# Patient Record
Sex: Male | Born: 1975 | Race: White | Hispanic: No | Marital: Single | Smoking: Current every day smoker
Health system: Southern US, Community
[De-identification: ages and names within clinical notes are randomized; demographics above are authoritative.]

## PROBLEM LIST (undated history)

## (undated) DIAGNOSIS — R569 Unspecified convulsions: Secondary | ICD-10-CM

## (undated) DIAGNOSIS — S61412A Laceration without foreign body of left hand, initial encounter: Secondary | ICD-10-CM

## (undated) HISTORY — PX: FINGER SURGERY: SHX640

## (undated) HISTORY — PX: HERNIA REPAIR: SHX51

---

## 2011-11-20 ENCOUNTER — Emergency Department (HOSPITAL_COMMUNITY)
Admission: EM | Admit: 2011-11-20 | Discharge: 2011-11-20 | Disposition: A | Discharge status: Home / Self Care | Payer: No Typology Code available for payment source | Attending: Emergency Medicine | Admitting: Emergency Medicine

## 2011-11-20 ENCOUNTER — Encounter (HOSPITAL_COMMUNITY): Payer: Self-pay | Admitting: Emergency Medicine

## 2011-11-20 ENCOUNTER — Emergency Department (HOSPITAL_COMMUNITY): Payer: No Typology Code available for payment source

## 2011-11-20 ENCOUNTER — Emergency Department (HOSPITAL_COMMUNITY): Payer: Self-pay

## 2011-11-20 DIAGNOSIS — F1010 Alcohol abuse, uncomplicated: Secondary | ICD-10-CM | POA: Insufficient documentation

## 2011-11-20 DIAGNOSIS — S161XXA Strain of muscle, fascia and tendon at neck level, initial encounter: Secondary | ICD-10-CM

## 2011-11-20 DIAGNOSIS — F10929 Alcohol use, unspecified with intoxication, unspecified: Secondary | ICD-10-CM

## 2011-11-20 DIAGNOSIS — R079 Chest pain, unspecified: Secondary | ICD-10-CM | POA: Insufficient documentation

## 2011-11-20 DIAGNOSIS — S139XXA Sprain of joints and ligaments of unspecified parts of neck, initial encounter: Secondary | ICD-10-CM | POA: Insufficient documentation

## 2011-11-20 NOTE — ED Notes (Signed)
Patient took off own ccollar at this time.
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
## 2011-11-20 NOTE — ED Notes (Signed)
PT co/ neck and back pain. On c collar and backboard.
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
## 2012-12-01 ENCOUNTER — Encounter (HOSPITAL_COMMUNITY): Payer: Self-pay | Admitting: *Deleted

## 2012-12-01 ENCOUNTER — Emergency Department (HOSPITAL_COMMUNITY)
Admission: EM | Admit: 2012-12-01 | Discharge: 2012-12-02 | Disposition: A | Discharge status: Home / Self Care | Payer: Self-pay | Attending: Emergency Medicine | Admitting: Emergency Medicine

## 2012-12-01 ENCOUNTER — Emergency Department (HOSPITAL_COMMUNITY): Payer: Self-pay

## 2012-12-01 DIAGNOSIS — S61209A Unspecified open wound of unspecified finger without damage to nail, initial encounter: Secondary | ICD-10-CM | POA: Insufficient documentation

## 2012-12-01 DIAGNOSIS — S61412A Laceration without foreign body of left hand, initial encounter: Secondary | ICD-10-CM

## 2012-12-01 DIAGNOSIS — W268XXA Contact with other sharp object(s), not elsewhere classified, initial encounter: Secondary | ICD-10-CM | POA: Insufficient documentation

## 2012-12-01 DIAGNOSIS — S61409A Unspecified open wound of unspecified hand, initial encounter: Secondary | ICD-10-CM | POA: Insufficient documentation

## 2012-12-01 DIAGNOSIS — Y9289 Other specified places as the place of occurrence of the external cause: Secondary | ICD-10-CM | POA: Insufficient documentation

## 2012-12-01 DIAGNOSIS — Y9389 Activity, other specified: Secondary | ICD-10-CM | POA: Insufficient documentation

## 2012-12-01 DIAGNOSIS — Y990 Civilian activity done for income or pay: Secondary | ICD-10-CM | POA: Insufficient documentation

## 2012-12-01 DIAGNOSIS — F17200 Nicotine dependence, unspecified, uncomplicated: Secondary | ICD-10-CM | POA: Insufficient documentation

## 2012-12-01 MED ORDER — OXYCODONE-ACETAMINOPHEN 5-325 MG PO TABS
1.0000 | ORAL_TABLET | Freq: Once | ORAL | Status: AC
  Administered 2012-12-01: 1 via ORAL
  Filled 2012-12-01: qty 1

## 2012-12-01 MED ORDER — BUPIVACAINE HCL (PF) 0.5 % IJ SOLN
10.0000 mL | Freq: Once | INTRAMUSCULAR | Status: AC
  Administered 2012-12-02: 10 mL
  Filled 2012-12-01: qty 10
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
## 2012-12-01 NOTE — ED Notes (Addendum)
Pt c/o dressing too tight. Cap refill <2sec. Finger pulses present. Dressing loosened. C/o numbness and tingling worse. Difference in skin temp distal and proximal to wound. FA cool to touch (different from R), no tightness noted in FA/ wrist. Decreased ROM (less than initial triage)

## 2012-12-02 MED ORDER — OXYCODONE-ACETAMINOPHEN 5-325 MG PO TABS: 2.0000 | ORAL_TABLET | ORAL | Status: DC | PRN

## 2012-12-02 NOTE — ED Provider Notes (Signed)
Medical screening examination/treatment/procedure(s) were conducted as a shared visit with non-physician practitioner(s) and myself.  I personally evaluated the patient during the encounter  Laceration overlying the volar, ulnar aspect of the left wrist. Significant pain on attempts to move the third, fourth and fifth fingers of that hand.  Hanley Seamen, MD 12/02/12 [phone]

## 2012-12-02 NOTE — ED Provider Notes (Signed)
History     CSN: [id number]  Arrival date & time 12/01/12  2101   First MD Initiated Contact with Patient 12/01/12 2303      Chief Complaint  Patient presents with  . Extremity Laceration  . Hand Pain   HPI  History provided by the patient. Patient is a 37 year old male who presents with laceration to his left hand. Patient states he was cleaning a large plate glass at work when it shattered causing a small cut to his palm of his left hand. Reports having electrical pain and tingling to his pinky and ring finger as well as up his forearm. Pain is worse with any movements of the hand or fingers. Patient does report removing one single large piece of glass from the area denies any persistent or heavy bleeding. Denies any other complaints. No other aggravating or alleviating factors. No other associated symptoms.    History reviewed. No pertinent past medical history.  Past Surgical History  Procedure Laterality Date  . Hernia repair    . Finger surgery      r/t staph infection I&D, hand lac cutting trees    No family history on file.  History  Substance Use Topics  . Smoking status: Current Every Day Smoker  . Smokeless tobacco: Not on file  . Alcohol Use: No      Review of Systems  Neurological: Positive for numbness. Negative for weakness.  All other systems reviewed and are negative.    Allergies  Review of patient's allergies indicates no known allergies.  Home Medications   Current Outpatient Rx  Name  Route  Sig  Dispense  Refill  . Multiple Vitamin (MULTIVITAMIN WITH MINERALS) TABS   Oral   Take 1 tablet by mouth daily.           BP 120/81  Pulse 66  Temp(Src) 98.7 F (37.1 C) (Oral)  Resp 20  SpO2 95%  Physical Exam  Nursing note and vitals reviewed. Constitutional: He is oriented to person, place, and time. He appears well-developed and well-nourished. No distress.  HENT:  Head: Normocephalic.  Cardiovascular: Normal rate and regular  rhythm.   Pulmonary/Chest: Effort normal and breath sounds normal.  Abdominal: Soft.  Musculoskeletal:  2.5 cm laceration to the minor eminence of the palm of the left hand. No significant active bleeding. No foreign bodies identified during exploration. No signs of tendon involvement during expiration.  Second small laceration over the dorsal PIP of fourth finger. No tendon involvement  Patient reports significant pain with full extension of his fingers especially the third through fifth fingers. He has decreased sensation primarily in the fourth digit with decreased 2 point sensation. There is normal cap refill less than 2 seconds.  Neurological: He is alert and oriented to person, place, and time.  Skin: Skin is warm.  Psychiatric: He has a normal mood and affect. His behavior is normal.    ED Course  Procedures   LACERATION REPAIR Performed by: Angus Seller Authorized by: Angus Seller Consent: Verbal consent obtained. Risks and benefits: risks, benefits and alternatives were discussed Consent given by: patient Patient identity confirmed: provided demographic data Prepped and Draped in normal sterile fashion Wound explored  Laceration Location: Palm of left hand  Laceration Length: 2.5 cm  No Foreign Bodies seen or palpated  Anesthesia: local infiltration  Local anesthetic: lidocaine 2% with epinephrine, bupivacaine 0.5% without epinephrine   Anesthetic total: 5 ml lidocaine, 6 mLs bupivacaine   Irrigation method: syringe Amount of  cleaning: standard  Skin closure: Skin with 4-0 Prolene   Number of sutures: 4   Technique: 3 simple interrupted 1 horizontal mattress   Patient tolerance: Patient tolerated the procedure well with no immediate complications.  LACERATION REPAIR Performed by: Angus Seller Authorized by: Angus Seller Consent: Verbal consent obtained. Risks and benefits: risks, benefits and alternatives were discussed Consent given by:  patient Patient identity confirmed: provided demographic data Prepped and Draped in normal sterile fashion Wound explored  Laceration Location: Dorsal PIP of left fourth finger  Laceration Length: 1 cm  No Foreign Bodies seen or palpated  Anesthesia: local infiltration  Local anesthetic: Bupivacaine 0.5% without epinephrine  Anesthetic total: 1 ml  Irrigation method: syringe Amount of cleaning: standard  Skin closure: Skin with 4-0 Prolene   Number of sutures: 2   Technique: Simple interrupted   Patient tolerance: Patient tolerated the procedure well with no immediate complications.    Dg Hand Complete Left  12/01/2012  *RADIOLOGY REPORT*  Clinical Data: Laceration to left and with broken glass.  LEFT HAND - COMPLETE 3+ VIEW  Comparison:  None.  Findings:  There is no evidence of fracture or dislocation.  There is no evidence of arthropathy or other focal bone abnormality. Soft tissues are unremarkable.No evidence of radiopaque foreign body or soft tissue gas.  IMPRESSION: Negative.   Original Report Authenticated By: Myles Rosenthal, M.D.      1. Laceration of hand, left, initial encounter       MDM  Patient seen and evaluated. Patient holding his hand and pacing rapidly appears in some pain and discomfort.  Patient was seen and evaluated with attending physician. Examination concerning for some nerve injury. At this time we'll plan to provide patient with orthopedic hand specialist referral for close followup.        Angus Seller, PA-C 12/02/12 [phone]

## 2012-12-06 ENCOUNTER — Encounter (HOSPITAL_BASED_OUTPATIENT_CLINIC_OR_DEPARTMENT_OTHER): Payer: Self-pay | Admitting: *Deleted

## 2012-12-07 ENCOUNTER — Other Ambulatory Visit: Payer: Self-pay | Admitting: Orthopedic Surgery

## 2012-12-11 ENCOUNTER — Encounter (HOSPITAL_BASED_OUTPATIENT_CLINIC_OR_DEPARTMENT_OTHER): Payer: Self-pay | Admitting: Certified Registered Nurse Anesthetist

## 2012-12-11 ENCOUNTER — Ambulatory Visit (HOSPITAL_BASED_OUTPATIENT_CLINIC_OR_DEPARTMENT_OTHER): Payer: Worker's Compensation | Admitting: Certified Registered Nurse Anesthetist

## 2012-12-11 ENCOUNTER — Ambulatory Visit (HOSPITAL_BASED_OUTPATIENT_CLINIC_OR_DEPARTMENT_OTHER)
Admission: RE | Admit: 2012-12-11 | Discharge: 2012-12-11 | Disposition: A | Discharge status: Home / Self Care | Payer: Worker's Compensation | Source: Ambulatory Visit | Attending: Orthopedic Surgery | Admitting: Orthopedic Surgery

## 2012-12-11 ENCOUNTER — Encounter (HOSPITAL_BASED_OUTPATIENT_CLINIC_OR_DEPARTMENT_OTHER): Payer: Self-pay

## 2012-12-11 ENCOUNTER — Encounter (HOSPITAL_BASED_OUTPATIENT_CLINIC_OR_DEPARTMENT_OTHER)
Admission: RE | Disposition: A | Discharge status: Home / Self Care | Payer: Self-pay | Source: Ambulatory Visit | Attending: Orthopedic Surgery

## 2012-12-11 DIAGNOSIS — S61209A Unspecified open wound of unspecified finger without damage to nail, initial encounter: Secondary | ICD-10-CM | POA: Insufficient documentation

## 2012-12-11 DIAGNOSIS — S5410XA Injury of median nerve at forearm level, unspecified arm, initial encounter: Secondary | ICD-10-CM | POA: Insufficient documentation

## 2012-12-11 DIAGNOSIS — F17200 Nicotine dependence, unspecified, uncomplicated: Secondary | ICD-10-CM | POA: Insufficient documentation

## 2012-12-11 DIAGNOSIS — W268XXA Contact with other sharp object(s), not elsewhere classified, initial encounter: Secondary | ICD-10-CM | POA: Insufficient documentation

## 2012-12-11 DIAGNOSIS — R209 Unspecified disturbances of skin sensation: Secondary | ICD-10-CM | POA: Insufficient documentation

## 2012-12-11 HISTORY — PX: NERVE REPAIR: SHX2083

## 2012-12-11 HISTORY — DX: Laceration without foreign body of left hand, initial encounter: S61.412A

## 2012-12-11 LAB — POCT HEMOGLOBIN-HEMACUE: Hemoglobin: 15.8 g/dL (ref 13.0–17.0)

## 2012-12-11 SURGERY — REPAIR, NERVE
Anesthesia: General | Site: Hand | Laterality: Left | Wound class: Contaminated

## 2012-12-11 MED ORDER — ACETAMINOPHEN 10 MG/ML IV SOLN
1000.0000 mg | Freq: Once | INTRAVENOUS | Status: AC
  Administered 2012-12-11: 1000 mg via INTRAVENOUS

## 2012-12-11 MED ORDER — CEFAZOLIN SODIUM-DEXTROSE 2-3 GM-% IV SOLR
2.0000 g | INTRAVENOUS | Status: AC
  Administered 2012-12-11: 2 g via INTRAVENOUS

## 2012-12-11 MED ORDER — LIDOCAINE HCL (CARDIAC) 20 MG/ML IV SOLN
INTRAVENOUS | Status: DC | PRN
  Administered 2012-12-11: 60 mg via INTRAVENOUS

## 2012-12-11 MED ORDER — FENTANYL CITRATE 0.05 MG/ML IJ SOLN
INTRAMUSCULAR | Status: DC | PRN
  Administered 2012-12-11 (×6): 25 ug via INTRAVENOUS
  Administered 2012-12-11: 100 ug via INTRAVENOUS
  Administered 2012-12-11: 25 ug via INTRAVENOUS

## 2012-12-11 MED ORDER — LACTATED RINGERS IV SOLN
INTRAVENOUS | Status: DC
  Administered 2012-12-11 (×2): via INTRAVENOUS

## 2012-12-11 MED ORDER — OXYCODONE HCL 5 MG/5ML PO SOLN: 5.0000 mg | Freq: Once | ORAL | Status: AC | PRN

## 2012-12-11 MED ORDER — HYDROMORPHONE HCL PF 1 MG/ML IJ SOLN
0.2500 mg | INTRAMUSCULAR | Status: DC | PRN
  Administered 2012-12-11 (×3): 0.5 mg via INTRAVENOUS

## 2012-12-11 MED ORDER — ONDANSETRON HCL 4 MG/2ML IJ SOLN
INTRAMUSCULAR | Status: DC | PRN
  Administered 2012-12-11: 4 mg via INTRAVENOUS

## 2012-12-11 MED ORDER — DEXAMETHASONE SODIUM PHOSPHATE 10 MG/ML IJ SOLN
INTRAMUSCULAR | Status: DC | PRN
  Administered 2012-12-11: 10 mg via INTRAVENOUS

## 2012-12-11 MED ORDER — CHLORHEXIDINE GLUCONATE 4 % EX LIQD: 60.0000 mL | Freq: Once | CUTANEOUS | Status: DC

## 2012-12-11 MED ORDER — MEPERIDINE HCL 25 MG/ML IJ SOLN: 6.2500 mg | INTRAMUSCULAR | Status: DC | PRN

## 2012-12-11 MED ORDER — MIDAZOLAM HCL 2 MG/2ML IJ SOLN: 1.0000 mg | INTRAMUSCULAR | Status: DC | PRN

## 2012-12-11 MED ORDER — 0.9 % SODIUM CHLORIDE (POUR BTL) OPTIME
TOPICAL | Status: DC | PRN
  Administered 2012-12-11: 200 mL

## 2012-12-11 MED ORDER — OXYCODONE-ACETAMINOPHEN 5-325 MG PO TABS: ORAL_TABLET | ORAL | Status: DC

## 2012-12-11 MED ORDER — FENTANYL CITRATE 0.05 MG/ML IJ SOLN: 50.0000 ug | INTRAMUSCULAR | Status: DC | PRN

## 2012-12-11 MED ORDER — PROPOFOL 10 MG/ML IV BOLUS
INTRAVENOUS | Status: DC | PRN
  Administered 2012-12-11: 250 mg via INTRAVENOUS

## 2012-12-11 MED ORDER — MIDAZOLAM HCL 5 MG/5ML IJ SOLN
INTRAMUSCULAR | Status: DC | PRN
  Administered 2012-12-11: 2 mg via INTRAVENOUS

## 2012-12-11 MED ORDER — PROMETHAZINE HCL 25 MG/ML IJ SOLN: 6.2500 mg | INTRAMUSCULAR | Status: DC | PRN

## 2012-12-11 MED ORDER — OXYCODONE HCL 5 MG PO TABS
5.0000 mg | ORAL_TABLET | Freq: Once | ORAL | Status: AC | PRN
  Administered 2012-12-11: 5 mg via ORAL

## 2012-12-11 MED ORDER — MIDAZOLAM HCL 2 MG/ML PO SYRP: 12.0000 mg | ORAL_SOLUTION | Freq: Once | ORAL | Status: DC | PRN

## 2012-12-11 MED ORDER — MIDAZOLAM HCL 2 MG/2ML IJ SOLN: 0.5000 mg | Freq: Once | INTRAMUSCULAR | Status: DC | PRN

## 2012-12-11 MED ORDER — BUPIVACAINE HCL (PF) 0.25 % IJ SOLN
INTRAMUSCULAR | Status: DC | PRN
  Administered 2012-12-11: 10 mL

## 2012-12-11 SURGICAL SUPPLY — 64 items
BAG DECANTER FOR FLEXI CONT (MISCELLANEOUS) IMPLANT
BANDAGE ELASTIC 3 VELCRO ST LF (GAUZE/BANDAGES/DRESSINGS) ×2 IMPLANT
BANDAGE GAUZE ELAST BULKY 4 IN (GAUZE/BANDAGES/DRESSINGS) ×2 IMPLANT
BLADE MINI RND TIP GREEN BEAV (BLADE) IMPLANT
BLADE SURG 15 STRL LF DISP TIS (BLADE) ×2 IMPLANT
BLADE SURG 15 STRL SS (BLADE) ×2
BNDG ESMARK 4X9 LF (GAUZE/BANDAGES/DRESSINGS) ×2 IMPLANT
BRUSH SCRUB EZ PLAIN DRY (MISCELLANEOUS) ×2 IMPLANT
CHLORAPREP W/TINT 26ML (MISCELLANEOUS) ×2 IMPLANT
CLOTH BEACON ORANGE TIMEOUT ST (SAFETY) ×2 IMPLANT
CORDS BIPOLAR (ELECTRODE) ×2 IMPLANT
COVER MAYO STAND STRL (DRAPES) ×2 IMPLANT
COVER TABLE BACK 60X90 (DRAPES) ×2 IMPLANT
CUFF TOURNIQUET SINGLE 18IN (TOURNIQUET CUFF) IMPLANT
CUFF TOURNIQUET SINGLE 24IN (TOURNIQUET CUFF) ×2 IMPLANT
DECANTER SPIKE VIAL GLASS SM (MISCELLANEOUS) ×2 IMPLANT
DRAPE EXTREMITY T 121X128X90 (DRAPE) ×2 IMPLANT
DRAPE SURG 17X23 STRL (DRAPES) ×2 IMPLANT
GAUZE XEROFORM 1X8 LF (GAUZE/BANDAGES/DRESSINGS) ×2 IMPLANT
GLOVE BIO SURGEON STRL SZ 6.5 (GLOVE) ×4 IMPLANT
GLOVE BIO SURGEON STRL SZ7.5 (GLOVE) ×2 IMPLANT
GLOVE BIOGEL PI IND STRL 7.0 (GLOVE) ×2 IMPLANT
GLOVE BIOGEL PI IND STRL 8 (GLOVE) ×2 IMPLANT
GLOVE BIOGEL PI IND STRL 8.5 (GLOVE) ×3 IMPLANT
GLOVE BIOGEL PI INDICATOR 7.0 (GLOVE) ×2
GLOVE BIOGEL PI INDICATOR 8 (GLOVE) ×2
GLOVE BIOGEL PI INDICATOR 8.5 (GLOVE) ×3
GOWN BRE IMP PREV XXLGXLNG (GOWN DISPOSABLE) ×4 IMPLANT
GOWN PREVENTION PLUS XLARGE (GOWN DISPOSABLE) ×4 IMPLANT
LOOP VESSEL MAXI BLUE (MISCELLANEOUS) IMPLANT
NDL SAFETY ECLIPSE 18X1.5 (NEEDLE) IMPLANT
NEEDLE HYPO 18GX1.5 SHARP (NEEDLE)
NEEDLE HYPO 25X1 1.5 SAFETY (NEEDLE) ×2 IMPLANT
NERVE GUIDE NEURAGEN 6MM (Tissue) ×2 IMPLANT
NS IRRIG 1000ML POUR BTL (IV SOLUTION) ×2 IMPLANT
PACK BASIN DAY SURGERY FS (CUSTOM PROCEDURE TRAY) ×2 IMPLANT
PAD CAST 3X4 CTTN HI CHSV (CAST SUPPLIES) ×1 IMPLANT
PAD CAST 4YDX4 CTTN HI CHSV (CAST SUPPLIES) IMPLANT
PADDING CAST ABS 4INX4YD NS (CAST SUPPLIES)
PADDING CAST ABS COTTON 4X4 ST (CAST SUPPLIES) IMPLANT
PADDING CAST COTTON 3X4 STRL (CAST SUPPLIES) ×1
PADDING CAST COTTON 4X4 STRL (CAST SUPPLIES)
SLEEVE SCD COMPRESS KNEE MED (MISCELLANEOUS) ×2 IMPLANT
SPEAR EYE SURG WECK-CEL (MISCELLANEOUS) IMPLANT
SPLINT PLASTER CAST XFAST 3X15 (CAST SUPPLIES) ×12 IMPLANT
SPLINT PLASTER XTRA FASTSET 3X (CAST SUPPLIES) ×12
SPONGE GAUZE 4X4 12PLY (GAUZE/BANDAGES/DRESSINGS) ×2 IMPLANT
STOCKINETTE 4X48 STRL (DRAPES) ×2 IMPLANT
SUT ETHIBOND 3-0 V-5 (SUTURE) IMPLANT
SUT ETHILON 4 0 PS 2 18 (SUTURE) ×4 IMPLANT
SUT ETHILON 8 0 BV130 4 (SUTURE) ×2 IMPLANT
SUT FIBERWIRE 4-0 18 TAPR NDL (SUTURE)
SUT MERSILENE 4 0 P 3 (SUTURE) ×2 IMPLANT
SUT MERSILENE 6 0 P 1 (SUTURE) IMPLANT
SUT NYLON 9 0 VRM6 (SUTURE) ×4 IMPLANT
SUT PROLENE 6 0 P 1 18 (SUTURE) IMPLANT
SUT SILK 4 0 PS 2 (SUTURE) IMPLANT
SUT VICRYL 4-0 PS2 18IN ABS (SUTURE) IMPLANT
SUTURE FIBERWR 4-0 18 TAPR NDL (SUTURE) IMPLANT
SYR BULB 3OZ (MISCELLANEOUS) ×2 IMPLANT
SYR CONTROL 10ML LL (SYRINGE) ×2 IMPLANT
TOWEL OR 17X24 6PK STRL BLUE (TOWEL DISPOSABLE) ×4 IMPLANT
TRAY DSU PREP LF (CUSTOM PROCEDURE TRAY) ×2 IMPLANT
UNDERPAD 30X30 INCONTINENT (UNDERPADS AND DIAPERS) ×2 IMPLANT

## 2012-12-11 NOTE — Anesthesia Procedure Notes (Signed)
Procedure Name: LMA Insertion Date/Time: 12/11/2012 1:57 PM Performed by: Kutter Schnepf D Pre-anesthesia Checklist: Patient identified, Emergency Drugs available, Suction available and Patient being monitored Patient Re-evaluated:Patient Re-evaluated prior to inductionOxygen Delivery Method: Circle System Utilized Preoxygenation: Pre-oxygenation with 100% oxygen Intubation Type: IV induction Ventilation: Mask ventilation without difficulty LMA: LMA inserted LMA Size: 5.0 Number of attempts: 1 Airway Equipment and Method: bite block Placement Confirmation: positive ETCO2 Tube secured with: Tape Dental Injury: Teeth and Oropharynx as per pre-operative assessment

## 2012-12-11 NOTE — Anesthesia Postprocedure Evaluation (Signed)
  Anesthesia Post-op Note  Patient: Lucas Williams  Procedure(s) Performed: Procedure(s): Left Hand Exploration Wound with Nerve Repair (Left)  Patient Location: PACU  Anesthesia Type:General  Level of Consciousness: awake, alert , oriented and patient cooperative  Airway and Oxygen Therapy: Patient Spontanous Breathing  Post-op Pain: mild  Post-op Assessment: Post-op Vital signs reviewed, Patient's Cardiovascular Status Stable, Respiratory Function Stable, Patent Airway, No signs of Nausea or vomiting and Pain level controlled  Post-op Vital Signs: Reviewed and stable  Complications: No apparent anesthesia complications

## 2012-12-11 NOTE — Transfer of Care (Signed)
Immediate Anesthesia Transfer of Care Note  Patient: Lucas Williams  Procedure(s) Performed: Procedure(s): Left Hand Exploration Wound with Nerve Repair (Left)  Patient Location: PACU  Anesthesia Type:General  Level of Consciousness: awake, alert , oriented and patient cooperative  Airway & Oxygen Therapy: Patient Spontanous Breathing and Patient connected to face mask oxygen  Post-op Assessment: Report given to PACU RN and Post -op Vital signs reviewed and stable  Post vital signs: Reviewed and stable  Complications: No apparent anesthesia complications

## 2012-12-11 NOTE — Brief Op Note (Signed)
12/11/2012  4:15 PM  PATIENT:  Lucas Williams  37 y.o. male  PRE-OPERATIVE DIAGNOSIS:  Left Hand Laceration  POST-OPERATIVE DIAGNOSIS:  Left Hand Laceration  PROCEDURE:  Procedure(s): Left Hand Exploration Wound with Nerve Repair (Left)  SURGEON:  Surgeon(s) and Role:    * Tami Ribas, MD - Primary    * Nicki Reaper, MD - Assisting  PHYSICIAN ASSISTANT:   ASSISTANTS: Cindee Salt, MD   ANESTHESIA:   general  EBL:  Total I/O In: 2200 [I.V.:2200] Out: -   BLOOD ADMINISTERED:none  DRAINS: none   LOCAL MEDICATIONS USED:  MARCAINE     SPECIMEN:  No Specimen  DISPOSITION OF SPECIMEN:  N/A  COUNTS:  YES  TOURNIQUET:   Total Tourniquet Time Documented: Upper Arm (Left) - 110 minutes Total: Upper Arm (Left) - 110 minutes   DICTATION: .Other Dictation: Dictation Number [id number]  PLAN OF CARE: Discharge to home after PACU  PATIENT DISPOSITION:  PACU - hemodynamically stable.

## 2012-12-11 NOTE — H&P (Signed)
  Lucas Williams is an 37 y.o. male.   Chief Complaint: left hand laceration and numbness HPI: 37 yo rhd male states he was at work 12/02/12 when tip jar broke and lacerated his left hand.  Seen at [facility] and wound I&D'd and sutures placed.  Reports numbness in index, long, and ring.  No previous injury and no other injury at this time.  Past Medical History  Diagnosis Date  . Laceration of hand, left     Past Surgical History  Procedure Laterality Date  . Finger surgery      r/t staph infection I&D, hand lac cutting trees  . Hernia repair      LT ihr    History reviewed. No pertinent family history. Social History:  reports that he has been smoking.  He has never used smokeless tobacco. He reports that he does not drink alcohol or use illicit drugs.  Allergies: No Known Allergies  Medications Prior to Admission  Medication Sig Dispense Refill  . Multiple Vitamin (MULTIVITAMIN WITH MINERALS) TABS Take 1 tablet by mouth daily.      Marland Kitchen oxyCODONE-acetaminophen (PERCOCET) 5-325 MG per tablet Take 2 tablets by mouth every 4 (four) hours as needed for pain.  20 tablet  0    Results for orders placed during the hospital encounter of 12/11/12 (from the past 48 hour(s))  POCT HEMOGLOBIN-HEMACUE     Status: None   Collection Time    12/11/12 12:05 PM      Result Value Range   Hemoglobin 15.8  13.0 - 17.0 g/dL    No results found.   A comprehensive review of systems was negative.  Blood pressure 134/72, pulse 71, temperature 98.2 F (36.8 C), temperature source Oral, resp. rate 18, height 6' (1.829 m), weight 103.987 kg (229 lb 4 oz), SpO2 97.00%.  General appearance: alert, cooperative and appears stated age Head: Normocephalic, without obvious abnormality, atraumatic Neck: supple, symmetrical, trachea midline Resp: clear to auscultation bilaterally Cardio: regular rate and rhythm GI: non tender Extremities: right UE: no wounds/ttp.  intact sensation and capillary refill all  digits.  +epl/fpl/io.  left hand: intact capillary refill all digits.  +epl/fpl.  wiggles digits.  poor sensation in long, index, and radial side of ring.  ulnar side of ring and thumb and small with better sensation.  wound at base of palm.   Pulses: 2+ and symmetric Skin: as above Neurologic: Grossly normal except as above Incision/Wound: As above  Assessment/Plan Left hand lacerations with possible nerve injury.  Recommend OR for exploration of wound and possible nerve repair as necessary.  Risks, benefits, and alternatives of surgery were discussed and the patient agrees with the plan of care.   Peggie Hornak R 12/11/2012, 12:32 PM

## 2012-12-11 NOTE — Op Note (Signed)
[id number] 

## 2012-12-11 NOTE — Anesthesia Preprocedure Evaluation (Signed)
Anesthesia Evaluation  Patient identified by MRN, date of birth, ID band Patient awake    Reviewed: Allergy & Precautions, H&P , NPO status , Patient's Chart, lab work & pertinent test results  History of Anesthesia Complications Negative for: history of anesthetic complications  Airway Mallampati: II TM Distance: >3 FB Neck ROM: Full    Dental  (+) Teeth Intact and Dental Advisory Given   Pulmonary Current Smoker,  breath sounds clear to auscultation  Pulmonary exam normal       Cardiovascular negative cardio ROS  Rhythm:Regular Rate:Normal     Neuro/Psych negative neurological ROS  negative psych ROS   GI/Hepatic negative GI ROS, Neg liver ROS,   Endo/Other  negative endocrine ROSMorbid obesity  Renal/GU negative Renal ROS     Musculoskeletal   Abdominal (+) + obese,   Peds  Hematology   Anesthesia Other Findings   Reproductive/Obstetrics                           Anesthesia Physical Anesthesia Plan  ASA: II  Anesthesia Plan: General   Post-op Pain Management:    Induction: Intravenous  Airway Management Planned: LMA  Additional Equipment:   Intra-op Plan:   Post-operative Plan:   Informed Consent: I have reviewed the patients History and Physical, chart, labs and discussed the procedure including the risks, benefits and alternatives for the proposed anesthesia with the patient or authorized representative who has indicated his/her understanding and acceptance.   Dental advisory given  Plan Discussed with: Surgeon and CRNA  Anesthesia Plan Comments: (Plan routine monitors, GA- LMA OK)        Anesthesia Quick Evaluation

## 2012-12-12 NOTE — Op Note (Signed)
NAMESTORY, VANVRANKEN NO.:  [id number]  MEDICAL RECORD NO.:  [id number]  LOCATION:                                 FACILITY:  PHYSICIAN:  Betha Loa, MD        DATE OF BIRTH:  16-Feb-1976  DATE OF PROCEDURE:  12/11/2012 DATE OF DISCHARGE:                              OPERATIVE REPORT   PREOPERATIVE DIAGNOSIS:  Left median nerve laceration and left ring finger extensor tendon laceration at proximal interphalangeal joint.  POSTOPERATIVE DIAGNOSIS:  Left median nerve laceration and left ring finger extensor tendon laceration at proximal interphalangeal joint.  PROCEDURE:   1. Left ring finger repair of extensor tendon laceration  2. Direct repair left partial median nerve laceration under microscope with  Neurogen tube wrap 3. Carpal tunnel release  SURGEON:  Betha Loa, MD  ASSISTANT:  Cindee Salt, MD  ANESTHESIA:  General.  IV FLUIDS:  Per anesthesia flow sheet.  ESTIMATED BLOOD LOSS:  Minimal.  COMPLICATIONS:  None.  SPECIMENS:  None.  TOURNIQUET TIME:  One hour 50 minutes.  DISPOSITION:  Stable to PACU.  INDICATIONS:  Lucas Williams is a 37 year old male who states that while at work, a tip jar broke lacerating his left hand.  He was seen in emergency department and his wounds were cleaned and sutured.  He followed up in the office.  He reported decreased sensation in the index long and ring fingers, particularly in the long finger.  He also noted laceration on the dorsum of the ring finger.  I discussed with Lucas Williams, the nature of the injury.  We recommended operative exploration of the wounds with potential repair of median nerve.  Risks, benefits, and alternatives of surgery were discussed including the risk of blood loss, infection, damage to nerves, vessels, tendons, ligaments, bone; failure of surgery; need for additional surgery, complications with wound healing, continued pain, continued numbness.  He voiced understanding of  these risks and elected to proceed.  OPERATIVE COURSE:  After being identified preoperatively by myself, the patient and I agreed upon procedure and site of procedure.  Surgical site was marked.  The risks, benefits, and alternatives of surgery were reviewed and he wished to proceed.  Surgical consent had been signed. He was given 2 g of IV Ancef as preoperative antibiotic prophylaxis.  He was transported to the operating room and placed on the operating room table in supine position with left upper extremity on arm board. General anesthesia was induced by anesthesiologist.  Left upper extremity was prepped and draped in normal sterile orthopedic fashion. Surgical pause was performed between surgeons, anesthesia, operating staff, and all were in agreement as to the patient, procedure, and site of procedure.  Tourniquet at the proximal aspect of the extremity was inflated to 250 mmHg after exsanguination of the limb with an Esmarch bandage.  The sutures had been removed.  The ring finger was explored first.  The wound was extended both proximally and distally.  There was a longitudinal laceration of the extensor tendon at the level of the PIP joint.  It did not appear to go into the joint.  There was no gross purulence.  The wound was copiously  irrigated with sterile saline.  A 4- 0 Mersilene suture was used in a figure-of-eight fashion to reapproximate the tendon edges.  The wound was then closed with 4-0 nylon in a horizontal mattress fashion.  Attention was turned to the volar wound.  It was extended again both proximally and distally.  It was carried into subcutaneous tissues by spreading technique.  Bipolar electrocautery was used to obtain hemostasis.  Release of the carpal tunnel was performed.  A transverse carpal ligament was incised sharply. Care was taken to ensure complete decompression distally.  The volar antebrachial fascia was then split as well.  The ulnar nerve and  artery were visualized and were intact.  They were traced through Guyon's canal which was also released.  The median nerve was identified.  There was a partial laceration at the level of the proximal aspect of the transverse carpal ligament.  The motor branch was identified and was intact.  The laceration was on the ulnar side of the nerve.  It coursed through the ulnar half of the nerve.  It was cleared of hematoma and freed up.  The tendons were inspected and were intact.  The microscope was brought in. The epineurium was released from the nerve to allow better visualization.  The hematoma was cleared off the end of the nerve.  A 9- 0 nylon suture was used in interrupted circumferential fashion to repair the nerve edges back to each other.  A 7-0 nylon suture was used also to take tension off the repair.  A 6 mm NeuroGen tube was split and used as a nerve wrap.  This was wrapped around the nerve at the injury site and sutured to itself.  It was done loosely, so there was no compression of the nerve.  The wound was copiously irrigated with sterile saline.  It was then closed with 4-0 nylon in a horizontal mattress fashion.  It was injected with 10 mL of 0.25% plain Marcaine to aid in postoperative analgesia.  It was then dressed with sterile Xeroform, 4x4s, and wrapped with a Kerlix bandage.  A dorsal splint including the index, long, ring, and small fingers were placed with the MPs flexed and the IPs extended. The wrist was flexed at 45 degrees.  This was wrapped with Kerlix and Ace bandage.  Tourniquet was deflated at 1 hour 10 minutes.  All fingertips were pink with brisk capillary refill after deflation of tourniquet.  Operative drapes were broken down and the patient was awoken from anesthesia safely.  He was transferred back to stretcher and taken to PACU in stable condition.  I will see him back in the office in 1 week for postoperative followup.  I will give him Percocet 5/325,  1-2 p.o. q.6 hours p.r.n. pain, dispensed #40.     Betha Loa, MD     KK/MEDQ  D:  12/11/2012  T:  12/12/2012  Job:  [id number]

## 2012-12-12 NOTE — Progress Notes (Signed)
Pt. encouraged to call Dr. Merlyn Lot if pain persists/is not tolerable.

## 2012-12-15 ENCOUNTER — Encounter (HOSPITAL_BASED_OUTPATIENT_CLINIC_OR_DEPARTMENT_OTHER): Payer: Self-pay | Admitting: Orthopedic Surgery

## 2015-10-20 ENCOUNTER — Emergency Department (HOSPITAL_COMMUNITY): Payer: Self-pay

## 2015-10-20 ENCOUNTER — Emergency Department (HOSPITAL_COMMUNITY)
Admission: EM | Admit: 2015-10-20 | Discharge: 2015-10-20 | Disposition: A | Discharge status: Home / Self Care | Payer: Self-pay | Attending: Emergency Medicine | Admitting: Emergency Medicine

## 2015-10-20 ENCOUNTER — Encounter (HOSPITAL_COMMUNITY): Payer: Self-pay | Admitting: *Deleted

## 2015-10-20 DIAGNOSIS — K219 Gastro-esophageal reflux disease without esophagitis: Secondary | ICD-10-CM | POA: Insufficient documentation

## 2015-10-20 DIAGNOSIS — Z79899 Other long term (current) drug therapy: Secondary | ICD-10-CM | POA: Insufficient documentation

## 2015-10-20 DIAGNOSIS — B349 Viral infection, unspecified: Secondary | ICD-10-CM | POA: Insufficient documentation

## 2015-10-20 DIAGNOSIS — F17200 Nicotine dependence, unspecified, uncomplicated: Secondary | ICD-10-CM | POA: Insufficient documentation

## 2015-10-20 LAB — CBC
HEMATOCRIT: 47.1 % (ref 39.0–52.0)
HEMOGLOBIN: 16.3 g/dL (ref 13.0–17.0)
MCH: 30.6 pg (ref 26.0–34.0)
MCHC: 34.6 g/dL (ref 30.0–36.0)
MCV: 88.5 fL (ref 78.0–100.0)
Platelets: 235 10*3/uL (ref 150–400)
RBC: 5.32 MIL/uL (ref 4.22–5.81)
RDW: 12.9 % (ref 11.5–15.5)
WBC: 8.8 10*3/uL (ref 4.0–10.5)

## 2015-10-20 LAB — BASIC METABOLIC PANEL
Anion gap: 10 (ref 5–15)
BUN: 10 mg/dL (ref 6–20)
CHLORIDE: 101 mmol/L (ref 101–111)
CO2: 28 mmol/L (ref 22–32)
Calcium: 9.8 mg/dL (ref 8.9–10.3)
Creatinine, Ser: 0.92 mg/dL (ref 0.61–1.24)
GFR calc non Af Amer: >60 mL/min (ref >60)
Glucose, Bld: 105 mg/dL — ABNORMAL HIGH (ref 65–99)
POTASSIUM: 4.7 mmol/L (ref 3.5–5.1)
SODIUM: 139 mmol/L (ref 135–145)

## 2015-10-20 LAB — TROPONIN I

## 2015-10-20 MED ORDER — OMEPRAZOLE 20 MG PO CPDR: 20.0000 mg | DELAYED_RELEASE_CAPSULE | Freq: Every day | ORAL | Status: DC

## 2015-10-20 MED ORDER — ALBUTEROL SULFATE (2.5 MG/3ML) 0.083% IN NEBU
2.5000 mg | INHALATION_SOLUTION | Freq: Once | RESPIRATORY_TRACT | Status: AC
  Administered 2015-10-20: 2.5 mg via RESPIRATORY_TRACT
  Filled 2015-10-20: qty 3

## 2015-10-20 MED ORDER — SODIUM CHLORIDE 0.9 % IV BOLUS (SEPSIS)
1000.0000 mL | Freq: Once | INTRAVENOUS | Status: AC
  Administered 2015-10-20: 1000 mL via INTRAVENOUS

## 2015-10-20 NOTE — ED Provider Notes (Signed)
CSN: [id number]     Arrival date & time 10/20/15  1300 History   First MD Initiated Contact with Patient 10/20/15 1642     Chief Complaint  Patient presents with  . Cough  . Chest Pain    assoc with eating   (Consider location/radiation/quality/duration/timing/severity/associated sxs/prior Treatment) HPI  40 y.o. male presents to the Emergency Department today complaining of cough/congestion/chest pain x 8 days with associated N/V. No hematemesis. Has had reported fever 102 on Monday. Dry cough. L ear pain. Shortness of breath. Notes decrease in PO intake due to N and eventual emesis. Chest pain occurs while eating. States that it is a sharp/crampy sensation 10/10 pain after ingestion. He is a smoker 1/2 PPD for 15-16 years. No other symptoms noted.   Past Medical History  Diagnosis Date  . Laceration of hand, left    Past Surgical History  Procedure Laterality Date  . Finger surgery      r/t staph infection I&D, hand lac cutting trees  . Hernia repair      LT ihr  . Nerve repair Left 12/11/2012    Procedure: Left Hand Exploration Wound with Nerve Repair;  Surgeon: Tami Ribas, MD;  Location: [facility];  Service: Orthopedics;  Laterality: Left;   No family history on file. Social History  Substance Use Topics  . Smoking status: Current Every Day Smoker -- 0.50 packs/day  . Smokeless tobacco: Never Used  . Alcohol Use: No    Review of Systems ROS reviewed and all are negative for acute change except as noted in the HPI.  Allergies  Review of patient's allergies indicates no known allergies.  Home Medications   Prior to Admission medications   Medication Sig Start Date End Date Taking? Authorizing Provider  Multiple Vitamin (MULTIVITAMIN WITH MINERALS) TABS Take 1 tablet by mouth daily.    Historical Provider, MD  oxyCODONE-acetaminophen (PERCOCET) 5-325 MG per tablet Take 2 tablets by mouth every 4 (four) hours as needed for pain. 12/02/12   Ivonne Andrew,  PA-C  oxyCODONE-acetaminophen (PERCOCET) 5-325 MG per tablet 1-2 tabs po q6 hours prn pain 12/11/12   Betha Loa, MD   BP 145/112 mmHg  Pulse 80  Temp(Src) 97.7 F (36.5 C) (Oral)  Resp 16  Ht 6' (1.829 m)  Wt 118.389 kg  BMI 35.39 kg/m2  SpO2 98%   Physical Exam  Constitutional: He is oriented to person, place, and time. He appears well-developed and well-nourished.  HENT:  Head: Normocephalic and atraumatic.  Right Ear: Tympanic membrane, external ear and ear canal normal.  Left Ear: Tympanic membrane, external ear and ear canal normal.  Nose: Nose normal.  Mouth/Throat: Uvula is midline, oropharynx is clear and moist and mucous membranes are normal.  Eyes: EOM are normal.  Cardiovascular: Normal rate, regular rhythm and normal heart sounds.   Pulmonary/Chest: Effort normal. He has wheezes in the right upper field, the right lower field, the left upper field and the left lower field. He has no rhonchi. He has no rales.  Abdominal: Soft. There is no tenderness.  Musculoskeletal: Normal range of motion.  Neurological: He is alert and oriented to person, place, and time.  Skin: Skin is warm and dry.  Psychiatric: He has a normal mood and affect. His behavior is normal. Thought content normal.  Nursing note and vitals reviewed.   ED Course  Procedures (including critical care time) Labs Review Labs Reviewed  BASIC METABOLIC PANEL - Abnormal; Notable for the following:  Glucose, Bld 105 (*)    All other components within normal limits  TROPONIN I  CBC   Imaging Review Dg Chest 2 View  10/20/2015  CLINICAL DATA:  Cough  Sob  For 8  days EXAM: CHEST  2 VIEW COMPARISON:  11/20/2011 FINDINGS: Midline trachea. Normal heart size and mediastinal contours. No pleural effusion or pneumothorax. Clear lungs. IMPRESSION: No acute cardiopulmonary disease. Electronically Signed   By: Jeronimo Greaves M.D.   On: 10/20/2015 13:55   I have personally reviewed and evaluated these images and  lab results as part of my medical decision-making.   EKG Interpretation None      MDM  I have reviewed relevant laboratory values. I have reviewed relevant imaging studies. I personally interpreted the relevant EKG. I have reviewed the relevant previous healthcare records. I obtained HPI from historian. Patient discussed with supervising physician  ED Course:  Assessment: 63y M presents with CP/SOB and congestion x8days. Trop neg. EKG unremarkable. CXR unremarkable. No evidence of Pneumonia. Wheezing noted on exam, given albuterol treatment with improvement of breathing. Based on HPI, most likely viral syndrome as well as reflux due to pain with ingestion of food. Currently Afebrile. VSS. Will give PPI at DC and have him follow up with PCP if symptoms persist. Patient is in no acute distress. Vital Signs are stable. Patient is able to ambulate. Patient able to tolerate PO.   Disposition/Plan:  DC Home Additional Verbal discharge instructions given and discussed with patient.  Pt Instructed to f/u with PCP in the next 48-72 hours for evaluation and treatment of symptoms. Return precautions given Pt acknowledges and agrees with plan  Supervising Physician Rolland Porter, MD   Final diagnoses:  Gastroesophageal reflux disease, esophagitis presence not specified  Viral syndrome      Audry Pili, PA-C 10/20/15 1829  Rolland Porter, MD 10/25/15 [phone]

## 2015-10-20 NOTE — ED Notes (Signed)
Patient undressed, in gown, on continuous pulse oximetry and blood pressure cuff 

## 2015-10-20 NOTE — ED Notes (Signed)
Pt states cough and sweats for 8 days as well as chest pain every time he eats.

## 2015-10-20 NOTE — Discharge Instructions (Signed)
Please read and follow all provided instructions.  Your diagnoses today include:  1. Gastroesophageal reflux disease, esophagitis presence not specified   2. Viral syndrome    Tests performed today include:  An EKG of your heart  A chest x-ray  Cardiac enzymes - a blood test for heart muscle damage  Blood counts and electrolytes  Vital signs. See below for your results today.   Medications prescribed:   Take any prescribed medications only as directed.  Follow-up instructions: Please follow-up with your primary care provider as soon as you can for further evaluation of your symptoms.   Return instructions:  SEEK IMMEDIATE MEDICAL ATTENTION IF:  You have severe chest pain, especially if the pain is crushing or pressure-like and spreads to the arms, back, neck, or jaw, or if you have sweating, nausea (feeling sick to your stomach), or shortness of breath. THIS IS AN EMERGENCY. Don't wait to see if the pain will go away. Get medical help at once. Call 911 or 0 (operator). DO NOT drive yourself to the hospital.   Your chest pain gets worse and does not go away with rest.   You have an attack of chest pain lasting longer than usual, despite rest and treatment with the medications your caregiver has prescribed.   You wake from sleep with chest pain or shortness of breath.  You feel dizzy or faint.  You have chest pain not typical of your usual pain for which you originally saw your caregiver.   You have any other emergent concerns regarding your health.  Additional Information: Chest pain comes from many different causes. Your caregiver has diagnosed you as having chest pain that is not specific for one problem, but does not require admission.  You are at low risk for an acute heart condition or other serious illness.   Your vital signs today were: BP 131/90 mmHg   Pulse 61   Temp(Src) 97.7 F (36.5 C) (Oral)   Resp 18   Ht 6' (1.829 m)   Wt 118.389 kg   BMI 35.39 kg/m2    SpO2 95% If your blood pressure (BP) was elevated above 135/85 this visit, please have this repeated by your doctor within one month. --------------

## 2017-01-29 ENCOUNTER — Emergency Department (HOSPITAL_COMMUNITY): Payer: Self-pay

## 2017-01-29 ENCOUNTER — Inpatient Hospital Stay (HOSPITAL_COMMUNITY)
Admission: EM | Admit: 2017-01-29 | Discharge: 2017-02-03 | DRG: 082 | Disposition: A | Discharge status: Home / Self Care | Payer: Self-pay | Attending: General Surgery | Admitting: General Surgery

## 2017-01-29 ENCOUNTER — Inpatient Hospital Stay (HOSPITAL_COMMUNITY): Payer: Self-pay

## 2017-01-29 DIAGNOSIS — S06339A Contusion and laceration of cerebrum, unspecified, with loss of consciousness of unspecified duration, initial encounter: Secondary | ICD-10-CM

## 2017-01-29 DIAGNOSIS — R402412 Glasgow coma scale score 13-15, at arrival to emergency department: Secondary | ICD-10-CM | POA: Diagnosis present

## 2017-01-29 DIAGNOSIS — S0011XA Contusion of right eyelid and periocular area, initial encounter: Secondary | ICD-10-CM | POA: Diagnosis present

## 2017-01-29 DIAGNOSIS — I609 Nontraumatic subarachnoid hemorrhage, unspecified: Secondary | ICD-10-CM

## 2017-01-29 DIAGNOSIS — G936 Cerebral edema: Secondary | ICD-10-CM | POA: Diagnosis present

## 2017-01-29 DIAGNOSIS — F17210 Nicotine dependence, cigarettes, uncomplicated: Secondary | ICD-10-CM | POA: Diagnosis present

## 2017-01-29 DIAGNOSIS — S069XAA Unspecified intracranial injury with loss of consciousness status unknown, initial encounter: Secondary | ICD-10-CM | POA: Diagnosis present

## 2017-01-29 DIAGNOSIS — F1020 Alcohol dependence, uncomplicated: Secondary | ICD-10-CM | POA: Diagnosis present

## 2017-01-29 DIAGNOSIS — S065X9A Traumatic subdural hemorrhage with loss of consciousness of unspecified duration, initial encounter: Principal | ICD-10-CM | POA: Diagnosis present

## 2017-01-29 DIAGNOSIS — Y900 Blood alcohol level of less than 20 mg/100 ml: Secondary | ICD-10-CM | POA: Diagnosis present

## 2017-01-29 DIAGNOSIS — S069X9A Unspecified intracranial injury with loss of consciousness of unspecified duration, initial encounter: Secondary | ICD-10-CM

## 2017-01-29 DIAGNOSIS — X58XXXA Exposure to other specified factors, initial encounter: Secondary | ICD-10-CM | POA: Diagnosis present

## 2017-01-29 DIAGNOSIS — S61412A Laceration without foreign body of left hand, initial encounter: Secondary | ICD-10-CM | POA: Diagnosis present

## 2017-01-29 LAB — CBC WITH DIFFERENTIAL/PLATELET
BASOS ABS: 0 10*3/uL (ref 0.0–0.1)
Basophils Relative: 0 %
Eosinophils Absolute: 0 10*3/uL (ref 0.0–0.7)
Eosinophils Relative: 0 %
HCT: 44.2 % (ref 39.0–52.0)
HEMOGLOBIN: 15.4 g/dL (ref 13.0–17.0)
LYMPHS ABS: 1.7 10*3/uL (ref 0.7–4.0)
Lymphocytes Relative: 11 %
MCH: 31 pg (ref 26.0–34.0)
MCHC: 34.8 g/dL (ref 30.0–36.0)
MCV: 89.1 fL (ref 78.0–100.0)
MONOS PCT: 12 %
Monocytes Absolute: 1.8 10*3/uL — ABNORMAL HIGH (ref 0.1–1.0)
NEUTROS ABS: 11.5 10*3/uL — AB (ref 1.7–7.7)
NEUTROS PCT: 77 %
Platelets: 193 10*3/uL (ref 150–400)
RBC: 4.96 MIL/uL (ref 4.22–5.81)
RDW: 12.8 % (ref 11.5–15.5)
WBC: 15 10*3/uL — ABNORMAL HIGH (ref 4.0–10.5)

## 2017-01-29 LAB — BASIC METABOLIC PANEL WITH GFR
Anion gap: 11 (ref 5–15)
BUN: 17 mg/dL (ref 6–20)
CO2: 23 mmol/L (ref 22–32)
Calcium: 9.3 mg/dL (ref 8.9–10.3)
Chloride: 99 mmol/L — ABNORMAL LOW (ref 101–111)
Creatinine, Ser: 1.4 mg/dL — ABNORMAL HIGH (ref 0.61–1.24)
GFR calc Af Amer: >60 mL/min (ref >60)
GFR calc non Af Amer: >60 mL/min (ref >60)
Glucose, Bld: 104 mg/dL — ABNORMAL HIGH (ref 65–99)
Potassium: 3.2 mmol/L — ABNORMAL LOW (ref 3.5–5.1)
Sodium: 133 mmol/L — ABNORMAL LOW (ref 135–145)

## 2017-01-29 LAB — PROTIME-INR
INR: 1.14
Prothrombin Time: 14.7 s (ref 11.4–15.2)

## 2017-01-29 LAB — ETHANOL: Alcohol, Ethyl (B): <5 mg/dL (ref <5)

## 2017-01-29 LAB — APTT: aPTT: 25 seconds (ref 24–36)

## 2017-01-29 LAB — MRSA PCR SCREENING: MRSA by PCR: NEGATIVE

## 2017-01-29 MED ORDER — PANTOPRAZOLE SODIUM 40 MG IV SOLR
40.0000 mg | Freq: Every day | INTRAVENOUS | Status: DC
  Administered 2017-01-30: 40 mg via INTRAVENOUS
  Filled 2017-01-29: qty 40

## 2017-01-29 MED ORDER — SODIUM CHLORIDE 0.9 % IV SOLN
INTRAVENOUS | Status: DC
  Administered 2017-01-29 (×2): via INTRAVENOUS
  Administered 2017-01-30: 1000 mL via INTRAVENOUS
  Administered 2017-01-31: 08:00:00 via INTRAVENOUS

## 2017-01-29 MED ORDER — SODIUM CHLORIDE 0.9 % IV SOLN
500.0000 mg | Freq: Two times a day (BID) | INTRAVENOUS | Status: DC
  Administered 2017-01-29 – 2017-02-03 (×10): 500 mg via INTRAVENOUS
  Filled 2017-01-29 (×12): qty 5

## 2017-01-29 MED ORDER — ONDANSETRON HCL 4 MG PO TABS: 4.0000 mg | ORAL_TABLET | Freq: Four times a day (QID) | ORAL | Status: DC | PRN

## 2017-01-29 MED ORDER — HYDROMORPHONE HCL 1 MG/ML IJ SOLN: 0.5000 mg | INTRAMUSCULAR | Status: DC | PRN

## 2017-01-29 MED ORDER — ACETAMINOPHEN 325 MG PO TABS
650.0000 mg | ORAL_TABLET | ORAL | Status: DC | PRN
Start: 2017-01-29 — End: 2017-02-03
  Administered 2017-01-30 – 2017-01-31 (×2): 650 mg via ORAL
  Filled 2017-01-29 (×2): qty 2

## 2017-01-29 MED ORDER — ONDANSETRON HCL 4 MG/2ML IJ SOLN: 4.0000 mg | Freq: Four times a day (QID) | INTRAMUSCULAR | Status: DC | PRN

## 2017-01-29 MED ORDER — PANTOPRAZOLE SODIUM 40 MG PO TBEC
40.0000 mg | DELAYED_RELEASE_TABLET | Freq: Every day | ORAL | Status: DC
  Administered 2017-01-31 – 2017-02-03 (×4): 40 mg via ORAL
  Filled 2017-01-29 (×4): qty 1

## 2017-01-29 MED ORDER — OXYCODONE HCL 5 MG PO TABS
5.0000 mg | ORAL_TABLET | ORAL | Status: DC | PRN
  Administered 2017-01-30 – 2017-02-01 (×6): 10 mg via ORAL
  Filled 2017-01-29 (×6): qty 2

## 2017-01-29 MED ORDER — MORPHINE SULFATE (PF) 4 MG/ML IV SOLN
4.0000 mg | Freq: Once | INTRAVENOUS | Status: AC
  Administered 2017-01-29: 4 mg via INTRAVENOUS
  Filled 2017-01-29: qty 1

## 2017-01-29 MED ORDER — DOCUSATE SODIUM 100 MG PO CAPS
100.0000 mg | ORAL_CAPSULE | Freq: Two times a day (BID) | ORAL | Status: DC
  Administered 2017-01-30 – 2017-02-03 (×8): 100 mg via ORAL
  Filled 2017-01-29 (×8): qty 1

## 2017-01-29 NOTE — ED Notes (Signed)
Patient transported to CT 

## 2017-01-29 NOTE — Progress Notes (Signed)
Received call in regards to pt mental status from nursing. She reports pt is somnolent but can be awakened with effort. Will still intermittently follow commands. Does move all extremities.   Appears to still be at baseline from when I saw him in the ER. Pt repeat head CT is scheduled for 0100 tomorrow (12 hours from initial consult). Will change to have it completed by 1900 today or sooner as indicated. Nursing will continue to monitor. Low threshold for scanning sooner.

## 2017-01-29 NOTE — ED Notes (Signed)
Pt Sister spoke with MD and aware of pt bed placement. Pt states it is fine to give information to sister Vernona RiegerLaura. [phone]

## 2017-01-29 NOTE — ED Notes (Signed)
Charge advising to pull NT for this Pod to sit with pt until sitter available.

## 2017-01-29 NOTE — Consult Note (Signed)
CC:  Chief Complaint  Patient presents with  . Altered Mental Status    HPI: Lucas Williams is a 41 y.o. male who was brought to ER by EMS after police found him outside with altered mental status. He is drowsy. He only answers questions in regards to name and location. Unable to answer any other questions including events surrounding what happened. Does not endorse any areas of pain. Does have bruising around right eye and abrasion on left side of forehead. No other pertinent history is noted in chart review.   PMH: Past Medical History:  Diagnosis Date  . Laceration of hand, left     PSH: Past Surgical History:  Procedure Laterality Date  . FINGER SURGERY     r/t staph infection I&D, hand lac cutting trees  . HERNIA REPAIR     LT ihr  . NERVE REPAIR Left 12/11/2012   Procedure: Left Hand Exploration Wound with Nerve Repair;  Surgeon: Tami RibasKevin R Kuzma, MD;  Location: [facility];  Service: Orthopedics;  Laterality: Left;    SH: Social History  Substance Use Topics  . Smoking status: Current Every Day Smoker    Packs/day: 0.50  . Smokeless tobacco: Never Used  . Alcohol use No    MEDS: Prior to Admission medications   Medication Sig Start Date End Date Taking? Authorizing Provider  omeprazole (PRILOSEC) 20 MG capsule Take 1 capsule (20 mg total) by mouth daily. Patient not taking: Reported on 01/29/2017 10/20/15   Audry PiliMohr, Tyler, PA-C  oxyCODONE-acetaminophen (PERCOCET) 5-325 MG per tablet Take 2 tablets by mouth every 4 (four) hours as needed for pain. Patient not taking: Reported on 10/20/2015 12/02/12   Ivonne Andrewammen, Peter, PA-C  oxyCODONE-acetaminophen (PERCOCET) 5-325 MG per tablet 1-2 tabs po q6 hours prn pain Patient not taking: Reported on 10/20/2015 12/11/12   Betha LoaKuzma, Kevin, MD    ALLERGY: No Known Allergies  ROS: Unable to obtain ROS  Vitals:   01/29/17 1200 01/29/17 1230  BP: (!) 155/87 (!) 144/84  Pulse: (!) 59 (!) 58  Resp: (!) 27 20  Temp:      General appearance: Drowsy, abrasions left side forehead and bruising surrounding left eye, WDWN Eyes: PERRL Cardiovascular: Regular rate and rhythm without murmurs, rubs, gallops. No edema or variciosities. Distal pulses normal. Pulmonary: Clear to auscultation Neurological: Drowsy but easily awakened Oriented to place and self. Does not answer other questions. Mumbled/groaning speech No facial droop. PERRL Follows commands with extremities. Extremities strength appears normal Sensation grossly intact  IMAGING: CT HEAD/SPINE/MAXILLOFACIAL IMPRESSION: Multiple areas of parenchymal hematoma in the brain. The largest hematoma in the right frontal lobe measures 4 x 5 cm with surrounding edema and 6 mm midline shift to the left. There is edema surrounding hemorrhages suggests in these may be subacute. Subarachnoid hemorrhage in the right sylvian fissure. Small tentorial subdural hematomas bilaterally. Findings most compatible with trauma. No skull fracture or soft tissue swelling in the head. Negative for facial fracture Negative for cervical spine fracture.  IMPRESSION/PLAN - 41 y.o. male presents with altered mental status. He is alert to self and location but otherwise does not answer questions. He does follow commands with his extremities. His head CT shows multiple parenchymal hematomas.  Discussed case with Dr Bevely Palmeritty. No surgical intervention indicated at this time. Pt is going to be admitted under trauma. Rec neuro exams q 1 hour. Repeat head CT 12 hours, sooner as indicated.  Keppra 500mg  BID x7days for seizure prophylaxis. High risk for herniation.

## 2017-01-29 NOTE — ED Notes (Signed)
Staffing advises there are no tele sitter machines open and no safety sitters at the time but pt will placed on wait list.

## 2017-01-29 NOTE — ED Notes (Signed)
XR at bedside

## 2017-01-29 NOTE — ED Triage Notes (Signed)
Pt presents with altered mental status, found outside by [employer].  Pt reports pain to back of head and R eye, pt unsure of injury.

## 2017-01-29 NOTE — ED Notes (Addendum)
Upon entering pt room he was dressed sitting in a chair and had removed his IV. Pt did receive all of his Keppra. EDP notified and encourage pt to stay. Pt agreed getting back in bed and allowing staff to place him back on monitor.

## 2017-01-29 NOTE — ED Provider Notes (Addendum)
[facility]-EMERGENCY DEPT Provider Note   CSN: [id number] Arrival date & time: 01/29/17  1106     History   Chief Complaint Chief Complaint  Patient presents with  . Altered Mental Status    HPI Lucas Williams is a 42 y.o. male.  HPI The patient presented to the emergency room with altered mental status. Patient was found outside with decreased level of consciousness by the police department. Patient had signs of bruising around his eye and an abrasion to his forehead. He was brought to the emergency room for further evaluation. Patient admits to drinking alcohol last night. He knows he is in the hospital. However, he states the last thing he remembers was having something to eat here which is not true. Past Medical History:  Diagnosis Date  . Laceration of hand, left     There are no active problems to display for this patient.   Past Surgical History:  Procedure Laterality Date  . FINGER SURGERY     r/t staph infection I&D, hand lac cutting trees  . HERNIA REPAIR     LT ihr  . NERVE REPAIR Left 12/11/2012   Procedure: Left Hand Exploration Wound with Nerve Repair;  Surgeon: Tami Ribas, MD;  Location: [facility];  Service: Orthopedics;  Laterality: Left;       Home Medications    Prior to Admission medications   Medication Sig Start Date End Date Taking? Authorizing Provider  omeprazole (PRILOSEC) 20 MG capsule Take 1 capsule (20 mg total) by mouth daily. Patient not taking: Reported on 01/29/2017 10/20/15   Audry Pili, PA-C  oxyCODONE-acetaminophen (PERCOCET) 5-325 MG per tablet Take 2 tablets by mouth every 4 (four) hours as needed for pain. Patient not taking: Reported on 10/20/2015 12/02/12   Ivonne Andrew, PA-C  oxyCODONE-acetaminophen (PERCOCET) 5-325 MG per tablet 1-2 tabs po q6 hours prn pain Patient not taking: Reported on 10/20/2015 12/11/12   Betha Loa, MD    Family History No family history on file.  Social History Social History    Substance Use Topics  . Smoking status: Current Every Day Smoker    Packs/day: 0.50  . Smokeless tobacco: Never Used  . Alcohol use No     Allergies   Patient has no known allergies.   Review of Systems Review of Systems  All other systems reviewed and are negative.    Physical Exam Updated Vital Signs BP (!) 144/84   Pulse (!) 58   Temp 99 F (37.2 C) (Oral)   Resp 20   Ht 5\' 11"  (1.803 m)   Wt 98.4 kg   SpO2 99%   BMI 30.27 kg/m   Physical Exam  Constitutional: He appears well-developed and well-nourished. No distress.  HENT:  Head: Normocephalic and atraumatic.  Right Ear: External ear normal.  Left Ear: External ear normal.  Ecchymoses around the right eye, abrasion to his left forehead  Eyes: Conjunctivae are normal. Right eye exhibits no discharge. Left eye exhibits no discharge. No scleral icterus.  Neck: Neck supple. No tracheal deviation present.  Cardiovascular: Normal rate, regular rhythm and intact distal pulses.   Pulmonary/Chest: Effort normal and breath sounds normal. No stridor. No respiratory distress. He has no wheezes. He has no rales. He exhibits no tenderness.  No bruising noted on the chest wall  Abdominal: Soft. Bowel sounds are normal. He exhibits no distension. There is no tenderness. There is no rebound and no guarding.  No bruising noted in the abdomen  Musculoskeletal:  He exhibits no edema or tenderness.       Right shoulder: He exhibits no tenderness, no bony tenderness and no swelling.       Left shoulder: He exhibits no tenderness, no bony tenderness and no swelling.       Right wrist: He exhibits no tenderness, no bony tenderness and no swelling.       Left wrist: He exhibits no tenderness, no bony tenderness and no swelling.       Right hip: He exhibits normal range of motion, no tenderness, no bony tenderness and no swelling.       Left hip: He exhibits normal range of motion, no tenderness and no bony tenderness.       Right  ankle: He exhibits no swelling. No tenderness.       Left ankle: He exhibits no swelling. No tenderness.       Cervical back: He exhibits no tenderness, no bony tenderness and no swelling.       Thoracic back: He exhibits no tenderness, no bony tenderness and no swelling.       Lumbar back: He exhibits no tenderness, no bony tenderness and no swelling.  Neurological: He is alert. He has normal strength. No cranial nerve deficit (no facial droop, extraocular movements intact, no slurred speech) or sensory deficit. He exhibits normal muscle tone. He displays no seizure activity. Coordination normal.  Skin: Skin is warm and dry. No rash noted.  Psychiatric: He has a normal mood and affect.  Nursing note and vitals reviewed.    ED Treatments / Results  Labs (all labs ordered are listed, but only abnormal results are displayed) Labs Reviewed  CBC WITH DIFFERENTIAL/PLATELET - Abnormal; Notable for the following:       Result Value   WBC 15.0 (*)    Neutro Abs 11.5 (*)    Monocytes Absolute 1.8 (*)    All other components within normal limits  BASIC METABOLIC PANEL  ETHANOL  PROTIME-INR  APTT    EKG  EKG Interpretation  Date/Time:  Saturday Jan 29 2017 11:14:06 EDT Ventricular Rate:  70 PR Interval:    QRS Duration: 109 QT Interval:  404 QTC Calculation: 436 R Axis:   7 Text Interpretation:  Sinus rhythm RSR' in V1 or V2, right VCD or RVH No old tracing to compare Confirmed by Hattye Siegfried  MD-J, Suleiman Finigan [phone]) on 01/29/2017 11:18:24 AM       Radiology Ct Head Wo Contrast  Result Date: 01/29/2017 CLINICAL DATA:  Altered mental status EXAM: CT HEAD WITHOUT CONTRAST CT MAXILLOFACIAL WITHOUT CONTRAST CT CERVICAL SPINE WITHOUT CONTRAST TECHNIQUE: Multidetector CT imaging of the head, cervical spine, and maxillofacial structures were performed using the standard protocol without intravenous contrast. Multiplanar CT image reconstructions of the cervical spine and maxillofacial structures were  also generated. COMPARISON:  None. FINDINGS: CT HEAD FINDINGS Brain: Multiple areas of acute hemorrhage in the brain. The largest area of hemorrhage is in the right frontal lobe. High-density hemorrhage in the inferior right frontal lobe with surrounding low-density edema. This area measures approximately 4 x 5 cm. Smaller area of acute hemorrhage in the left frontal lobe. 2 cm hemorrhage in the right anterior temporal lobe and 1 cm hemorrhage in the left temporal lobe. 1 cm area of hemorrhage in the right occipital lobe. Subarachnoid hemorrhage in the sylvian fissure on the right. Small tentorial subdural hematoma bilaterally. Ventricles normal in size. 6 mm midline shift to the left due to mass-effect from right-sided hemorrhage  and edema. Vascular: Negative for hyperdense vessel. Skull: Negative for skull fracture. Other: Mild mucosal edema in the paranasal sinuses. No significant soft tissue swelling in the face or head. CT MAXILLOFACIAL FINDINGS Osseous: Negative for facial fracture. No fracture of the orbit or mandible or nasal bone. Orbits: Negative Sinuses: Mild mucosal edema in the paranasal sinuses. Negative for air-fluid level. Soft tissues: No significant soft tissue swelling in the face. CT CERVICAL SPINE FINDINGS Alignment: Normal Skull base and vertebrae: Negative for fracture. Soft tissues and spinal canal: No soft tissue swelling or hematoma. Disc levels:  Mild disc degeneration and spurring C5-6 and C6-7 Upper chest: Negative Other: None IMPRESSION: Multiple areas of parenchymal hematoma in the brain. The largest hematoma in the right frontal lobe measures 4 x 5 cm with surrounding edema and 6 mm midline shift to the left. There is edema surrounding hemorrhages suggests in these may be subacute. Subarachnoid hemorrhage in the right sylvian fissure. Small tentorial subdural hematomas bilaterally. Findings most compatible with trauma. No skull fracture or soft tissue swelling in the head. Negative  for facial fracture Negative for cervical spine fracture. These results were called by telephone at the time of interpretation on 01/29/2017 at 12:23 pm to Dr. Linwood DibblesJON Ashanti Littles , who verbally acknowledged these results. Electronically Signed   By: Marlan Palauharles  Clark M.D.   On: 01/29/2017 12:24   Ct Cervical Spine Wo Contrast  Result Date: 01/29/2017 CLINICAL DATA:  Altered mental status EXAM: CT HEAD WITHOUT CONTRAST CT MAXILLOFACIAL WITHOUT CONTRAST CT CERVICAL SPINE WITHOUT CONTRAST TECHNIQUE: Multidetector CT imaging of the head, cervical spine, and maxillofacial structures were performed using the standard protocol without intravenous contrast. Multiplanar CT image reconstructions of the cervical spine and maxillofacial structures were also generated. COMPARISON:  None. FINDINGS: CT HEAD FINDINGS Brain: Multiple areas of acute hemorrhage in the brain. The largest area of hemorrhage is in the right frontal lobe. High-density hemorrhage in the inferior right frontal lobe with surrounding low-density edema. This area measures approximately 4 x 5 cm. Smaller area of acute hemorrhage in the left frontal lobe. 2 cm hemorrhage in the right anterior temporal lobe and 1 cm hemorrhage in the left temporal lobe. 1 cm area of hemorrhage in the right occipital lobe. Subarachnoid hemorrhage in the sylvian fissure on the right. Small tentorial subdural hematoma bilaterally. Ventricles normal in size. 6 mm midline shift to the left due to mass-effect from right-sided hemorrhage and edema. Vascular: Negative for hyperdense vessel. Skull: Negative for skull fracture. Other: Mild mucosal edema in the paranasal sinuses. No significant soft tissue swelling in the face or head. CT MAXILLOFACIAL FINDINGS Osseous: Negative for facial fracture. No fracture of the orbit or mandible or nasal bone. Orbits: Negative Sinuses: Mild mucosal edema in the paranasal sinuses. Negative for air-fluid level. Soft tissues: No significant soft tissue swelling  in the face. CT CERVICAL SPINE FINDINGS Alignment: Normal Skull base and vertebrae: Negative for fracture. Soft tissues and spinal canal: No soft tissue swelling or hematoma. Disc levels:  Mild disc degeneration and spurring C5-6 and C6-7 Upper chest: Negative Other: None IMPRESSION: Multiple areas of parenchymal hematoma in the brain. The largest hematoma in the right frontal lobe measures 4 x 5 cm with surrounding edema and 6 mm midline shift to the left. There is edema surrounding hemorrhages suggests in these may be subacute. Subarachnoid hemorrhage in the right sylvian fissure. Small tentorial subdural hematomas bilaterally. Findings most compatible with trauma. No skull fracture or soft tissue swelling in the head. Negative  for facial fracture Negative for cervical spine fracture. These results were called by telephone at the time of interpretation on 01/29/2017 at 12:23 pm to Dr. Linwood Dibbles , who verbally acknowledged these results. Electronically Signed   By: Marlan Palau M.D.   On: 01/29/2017 12:24   Dg Chest Portable 1 View  Result Date: 01/29/2017 CLINICAL DATA:  Trauma, initial encounter. EXAM: PORTABLE CHEST 1 VIEW COMPARISON:  10/20/2015. FINDINGS: Trachea is midline. Heart size within normal limits. Lungs are low in volume but clear. No pleural fluid. No pneumothorax. Osseous structures appear grossly intact. IMPRESSION: Negative. Electronically Signed   By: Leanna Battles M.D.   On: 01/29/2017 12:23   Ct Maxillofacial Wo Contrast  Result Date: 01/29/2017 CLINICAL DATA:  Altered mental status EXAM: CT HEAD WITHOUT CONTRAST CT MAXILLOFACIAL WITHOUT CONTRAST CT CERVICAL SPINE WITHOUT CONTRAST TECHNIQUE: Multidetector CT imaging of the head, cervical spine, and maxillofacial structures were performed using the standard protocol without intravenous contrast. Multiplanar CT image reconstructions of the cervical spine and maxillofacial structures were also generated. COMPARISON:  None. FINDINGS: CT  HEAD FINDINGS Brain: Multiple areas of acute hemorrhage in the brain. The largest area of hemorrhage is in the right frontal lobe. High-density hemorrhage in the inferior right frontal lobe with surrounding low-density edema. This area measures approximately 4 x 5 cm. Smaller area of acute hemorrhage in the left frontal lobe. 2 cm hemorrhage in the right anterior temporal lobe and 1 cm hemorrhage in the left temporal lobe. 1 cm area of hemorrhage in the right occipital lobe. Subarachnoid hemorrhage in the sylvian fissure on the right. Small tentorial subdural hematoma bilaterally. Ventricles normal in size. 6 mm midline shift to the left due to mass-effect from right-sided hemorrhage and edema. Vascular: Negative for hyperdense vessel. Skull: Negative for skull fracture. Other: Mild mucosal edema in the paranasal sinuses. No significant soft tissue swelling in the face or head. CT MAXILLOFACIAL FINDINGS Osseous: Negative for facial fracture. No fracture of the orbit or mandible or nasal bone. Orbits: Negative Sinuses: Mild mucosal edema in the paranasal sinuses. Negative for air-fluid level. Soft tissues: No significant soft tissue swelling in the face. CT CERVICAL SPINE FINDINGS Alignment: Normal Skull base and vertebrae: Negative for fracture. Soft tissues and spinal canal: No soft tissue swelling or hematoma. Disc levels:  Mild disc degeneration and spurring C5-6 and C6-7 Upper chest: Negative Other: None IMPRESSION: Multiple areas of parenchymal hematoma in the brain. The largest hematoma in the right frontal lobe measures 4 x 5 cm with surrounding edema and 6 mm midline shift to the left. There is edema surrounding hemorrhages suggests in these may be subacute. Subarachnoid hemorrhage in the right sylvian fissure. Small tentorial subdural hematomas bilaterally. Findings most compatible with trauma. No skull fracture or soft tissue swelling in the head. Negative for facial fracture Negative for cervical spine  fracture. These results were called by telephone at the time of interpretation on 01/29/2017 at 12:23 pm to Dr. Linwood Dibbles , who verbally acknowledged these results. Electronically Signed   By: Marlan Palau M.D.   On: 01/29/2017 12:24    Procedures .Critical Care Performed by: Linwood Dibbles Authorized by: Linwood Dibbles   Critical care provider statement:    Critical care time (minutes):  30   Critical care was necessary to treat or prevent imminent or life-threatening deterioration of the following conditions:  Trauma   Critical care was time spent personally by me on the following activities:  Discussions with consultants, evaluation of patient's  response to treatment, examination of patient, ordering and performing treatments and interventions, ordering and review of laboratory studies, ordering and review of radiographic studies, pulse oximetry, re-evaluation of patient's condition, obtaining history from patient or surrogate and review of old charts     (including critical care time)  Medications Ordered in ED Medications  0.9 %  sodium chloride infusion ( Intravenous New Bag/Given 01/29/17 1221)  morphine 4 MG/ML injection 4 mg (4 mg Intravenous Given 01/29/17 1221)     Initial Impression / Assessment and Plan / ED Course  I have reviewed the triage vital signs and the nursing notes.  Pertinent labs & imaging results that were available during my care of the patient were reviewed by me and considered in my medical decision making (see chart for details).  Clinical Course as of Jan 29 1245  Sat Jan 29, 2017  1201 I reviewed the preliminary images on the CT scan. Patient appears to have extensive evidence of cerebral contusions associated with his injury. I'm waiting for the radiologist to read the films. I will consult with normal surgery. We'll continue to monitor very closely.patient remains GCS 14  [JK]  1203 Patient's mental status is unchanged. He is able to answer his name. He is  following commands. He is speaking clearly.  [JK]    Clinical Course User Index [JK] Linwood Dibbles, MD    Patient presents to emergency room with altered mental status after being found down but by the police department. He has obvious signs of injury on his head and face. His pulmonary CT scan is concerning for cerebral hemorrhage and contusion. He remains neurologically intact at this point. I will consult with neurosurgery and trauma surgery.  Spoke with Dr Donell Beers who will see patient regarding trauma. Spoke with PA working with Dr Bevely Palmer, will see in the ED.  Final Clinical Impressions(s) / ED Diagnoses   Final diagnoses:  Contusion of cerebrum with loss of consciousness, unspecified laterality, initial encounter [facility])  Traumatic brain injury with loss of consciousness, initial encounter [facility])       Linwood Dibbles, MD 01/29/17 1246 The patient got dressed and was ready to walk out of the hospital. I explained to him that he needed to stay and he was able to be redirected. Clearly this is associated with the patient's head injury. He is waiting for inpatient.  3:28 PM Discussed with pt's sister.  Pt is an alcoholic. She would like someone to talk to him about alcohol abuse when it is appropriate.   Linwood Dibbles, MD 01/29/17 1530

## 2017-01-29 NOTE — ED Notes (Signed)
Pt reports having about 3 beers last night, denies drug use, unsure of time or place that he had 3 beers.

## 2017-01-29 NOTE — H&P (Signed)
History   Lucas Williams is an 41 y.o. male.   Chief Complaint:  Chief Complaint  Patient presents with  . Altered Mental Status    Pt is a 41 yo M who was found down by police.  He was disoriented and had bruising around eyes.  He denies nausea or vomiting.  He is not really clear about what happened.  He can follow some commands.   No other information available.    Past Medical History:  Diagnosis Date  . Laceration of hand, left     Past Surgical History:  Procedure Laterality Date  . FINGER SURGERY     r/t staph infection I&D, hand lac cutting trees  . HERNIA REPAIR     LT ihr  . NERVE REPAIR Left 12/11/2012   Procedure: Left Hand Exploration Wound with Nerve Repair;  Surgeon: Tennis Must, MD;  Location: [place];  Service: Orthopedics;  Laterality: Left;    No family history on file. Social History:  reports that he has been smoking.  He has been smoking about 0.50 packs per day. He has never used smokeless tobacco. He reports that he does not drink alcohol or use drugs.  Allergies  No Known Allergies  Home Medications   (Not in a hospital admission)  Trauma Course   Results for orders placed or performed during the hospital encounter of 01/29/17 (from the past 48 hour(s))  CBC WITH DIFFERENTIAL     Status: Abnormal   Collection Time: 01/29/17 11:12 AM  Result Value Ref Range   WBC 15.0 (H) 4.0 - 10.5 K/uL   RBC 4.96 4.22 - 5.81 MIL/uL   Hemoglobin 15.4 13.0 - 17.0 g/dL   HCT 44.2 39.0 - 52.0 %   MCV 89.1 78.0 - 100.0 fL   MCH 31.0 26.0 - 34.0 pg   MCHC 34.8 30.0 - 36.0 g/dL   RDW 12.8 11.5 - 15.5 %   Platelets 193 150 - 400 K/uL   Neutrophils Relative % 77 %   Neutro Abs 11.5 (H) 1.7 - 7.7 K/uL   Lymphocytes Relative 11 %   Lymphs Abs 1.7 0.7 - 4.0 K/uL   Monocytes Relative 12 %   Monocytes Absolute 1.8 (H) 0.1 - 1.0 K/uL   Eosinophils Relative 0 %   Eosinophils Absolute 0.0 0.0 - 0.7 K/uL   Basophils Relative 0 %   Basophils  Absolute 0.0 0.0 - 0.1 K/uL  Basic metabolic panel     Status: Abnormal   Collection Time: 01/29/17 11:12 AM  Result Value Ref Range   Sodium 133 (L) 135 - 145 mmol/L   Potassium 3.2 (L) 3.5 - 5.1 mmol/L   Chloride 99 (L) 101 - 111 mmol/L   CO2 23 22 - 32 mmol/L   Glucose, Bld 104 (H) 65 - 99 mg/dL   BUN 17 6 - 20 mg/dL   Creatinine, Ser 1.40 (H) 0.61 - 1.24 mg/dL   Calcium 9.3 8.9 - 10.3 mg/dL   GFR calc non Af Amer >60 >60 mL/min   GFR calc Af Amer >60 >60 mL/min    Comment: (NOTE) The eGFR has been calculated using the CKD EPI equation. This calculation has not been validated in all clinical situations. eGFR's persistently <60 mL/min signify possible Chronic Kidney Disease.    Anion gap 11 5 - 15  Ethanol     Status: None   Collection Time: 01/29/17 11:12 AM  Result Value Ref Range   Alcohol, Ethyl (B) <5 <  5 mg/dL    Comment:        LOWEST DETECTABLE LIMIT FOR SERUM ALCOHOL IS 5 mg/dL FOR MEDICAL PURPOSES ONLY   Protime-INR     Status: None   Collection Time: 01/29/17 12:35 PM  Result Value Ref Range   Prothrombin Time 14.7 11.4 - 15.2 seconds   INR 1.14   APTT     Status: None   Collection Time: 01/29/17 12:35 PM  Result Value Ref Range   aPTT 25 24 - 36 seconds   Ct Head Wo Contrast  Result Date: 01/29/2017 CLINICAL DATA:  Altered mental status EXAM: CT HEAD WITHOUT CONTRAST CT MAXILLOFACIAL WITHOUT CONTRAST CT CERVICAL SPINE WITHOUT CONTRAST TECHNIQUE: Multidetector CT imaging of the head, cervical spine, and maxillofacial structures were performed using the standard protocol without intravenous contrast. Multiplanar CT image reconstructions of the cervical spine and maxillofacial structures were also generated. COMPARISON:  None. FINDINGS: CT HEAD FINDINGS Brain: Multiple areas of acute hemorrhage in the brain. The largest area of hemorrhage is in the right frontal lobe. High-density hemorrhage in the inferior right frontal lobe with surrounding low-density edema.  This area measures approximately 4 x 5 cm. Smaller area of acute hemorrhage in the left frontal lobe. 2 cm hemorrhage in the right anterior temporal lobe and 1 cm hemorrhage in the left temporal lobe. 1 cm area of hemorrhage in the right occipital lobe. Subarachnoid hemorrhage in the sylvian fissure on the right. Small tentorial subdural hematoma bilaterally. Ventricles normal in size. 6 mm midline shift to the left due to mass-effect from right-sided hemorrhage and edema. Vascular: Negative for hyperdense vessel. Skull: Negative for skull fracture. Other: Mild mucosal edema in the paranasal sinuses. No significant soft tissue swelling in the face or head. CT MAXILLOFACIAL FINDINGS Osseous: Negative for facial fracture. No fracture of the orbit or mandible or nasal bone. Orbits: Negative Sinuses: Mild mucosal edema in the paranasal sinuses. Negative for air-fluid level. Soft tissues: No significant soft tissue swelling in the face. CT CERVICAL SPINE FINDINGS Alignment: Normal Skull base and vertebrae: Negative for fracture. Soft tissues and spinal canal: No soft tissue swelling or hematoma. Disc levels:  Mild disc degeneration and spurring C5-6 and C6-7 Upper chest: Negative Other: None IMPRESSION: Multiple areas of parenchymal hematoma in the brain. The largest hematoma in the right frontal lobe measures 4 x 5 cm with surrounding edema and 6 mm midline shift to the left. There is edema surrounding hemorrhages suggests in these may be subacute. Subarachnoid hemorrhage in the right sylvian fissure. Small tentorial subdural hematomas bilaterally. Findings most compatible with trauma. No skull fracture or soft tissue swelling in the head. Negative for facial fracture Negative for cervical spine fracture. These results were called by telephone at the time of interpretation on 01/29/2017 at 12:23 pm to Dr. Dorie Rank , who verbally acknowledged these results. Electronically Signed   By: Franchot Gallo M.D.   On: 01/29/2017  12:24   Ct Cervical Spine Wo Contrast  Result Date: 01/29/2017 CLINICAL DATA:  Altered mental status EXAM: CT HEAD WITHOUT CONTRAST CT MAXILLOFACIAL WITHOUT CONTRAST CT CERVICAL SPINE WITHOUT CONTRAST TECHNIQUE: Multidetector CT imaging of the head, cervical spine, and maxillofacial structures were performed using the standard protocol without intravenous contrast. Multiplanar CT image reconstructions of the cervical spine and maxillofacial structures were also generated. COMPARISON:  None. FINDINGS: CT HEAD FINDINGS Brain: Multiple areas of acute hemorrhage in the brain. The largest area of hemorrhage is in the right frontal lobe. High-density hemorrhage in  the inferior right frontal lobe with surrounding low-density edema. This area measures approximately 4 x 5 cm. Smaller area of acute hemorrhage in the left frontal lobe. 2 cm hemorrhage in the right anterior temporal lobe and 1 cm hemorrhage in the left temporal lobe. 1 cm area of hemorrhage in the right occipital lobe. Subarachnoid hemorrhage in the sylvian fissure on the right. Small tentorial subdural hematoma bilaterally. Ventricles normal in size. 6 mm midline shift to the left due to mass-effect from right-sided hemorrhage and edema. Vascular: Negative for hyperdense vessel. Skull: Negative for skull fracture. Other: Mild mucosal edema in the paranasal sinuses. No significant soft tissue swelling in the face or head. CT MAXILLOFACIAL FINDINGS Osseous: Negative for facial fracture. No fracture of the orbit or mandible or nasal bone. Orbits: Negative Sinuses: Mild mucosal edema in the paranasal sinuses. Negative for air-fluid level. Soft tissues: No significant soft tissue swelling in the face. CT CERVICAL SPINE FINDINGS Alignment: Normal Skull base and vertebrae: Negative for fracture. Soft tissues and spinal canal: No soft tissue swelling or hematoma. Disc levels:  Mild disc degeneration and spurring C5-6 and C6-7 Upper chest: Negative Other: None  IMPRESSION: Multiple areas of parenchymal hematoma in the brain. The largest hematoma in the right frontal lobe measures 4 x 5 cm with surrounding edema and 6 mm midline shift to the left. There is edema surrounding hemorrhages suggests in these may be subacute. Subarachnoid hemorrhage in the right sylvian fissure. Small tentorial subdural hematomas bilaterally. Findings most compatible with trauma. No skull fracture or soft tissue swelling in the head. Negative for facial fracture Negative for cervical spine fracture. These results were called by telephone at the time of interpretation on 01/29/2017 at 12:23 pm to Dr. Dorie Rank , who verbally acknowledged these results. Electronically Signed   By: Franchot Gallo M.D.   On: 01/29/2017 12:24   Dg Chest Portable 1 View  Result Date: 01/29/2017 CLINICAL DATA:  Trauma, initial encounter. EXAM: PORTABLE CHEST 1 VIEW COMPARISON:  10/20/2015. FINDINGS: Trachea is midline. Heart size within normal limits. Lungs are low in volume but clear. No pleural fluid. No pneumothorax. Osseous structures appear grossly intact. IMPRESSION: Negative. Electronically Signed   By: Lorin Picket M.D.   On: 01/29/2017 12:23   Ct Maxillofacial Wo Contrast  Result Date: 01/29/2017 CLINICAL DATA:  Altered mental status EXAM: CT HEAD WITHOUT CONTRAST CT MAXILLOFACIAL WITHOUT CONTRAST CT CERVICAL SPINE WITHOUT CONTRAST TECHNIQUE: Multidetector CT imaging of the head, cervical spine, and maxillofacial structures were performed using the standard protocol without intravenous contrast. Multiplanar CT image reconstructions of the cervical spine and maxillofacial structures were also generated. COMPARISON:  None. FINDINGS: CT HEAD FINDINGS Brain: Multiple areas of acute hemorrhage in the brain. The largest area of hemorrhage is in the right frontal lobe. High-density hemorrhage in the inferior right frontal lobe with surrounding low-density edema. This area measures approximately 4 x 5 cm.  Smaller area of acute hemorrhage in the left frontal lobe. 2 cm hemorrhage in the right anterior temporal lobe and 1 cm hemorrhage in the left temporal lobe. 1 cm area of hemorrhage in the right occipital lobe. Subarachnoid hemorrhage in the sylvian fissure on the right. Small tentorial subdural hematoma bilaterally. Ventricles normal in size. 6 mm midline shift to the left due to mass-effect from right-sided hemorrhage and edema. Vascular: Negative for hyperdense vessel. Skull: Negative for skull fracture. Other: Mild mucosal edema in the paranasal sinuses. No significant soft tissue swelling in the face or head. CT MAXILLOFACIAL  FINDINGS Osseous: Negative for facial fracture. No fracture of the orbit or mandible or nasal bone. Orbits: Negative Sinuses: Mild mucosal edema in the paranasal sinuses. Negative for air-fluid level. Soft tissues: No significant soft tissue swelling in the face. CT CERVICAL SPINE FINDINGS Alignment: Normal Skull base and vertebrae: Negative for fracture. Soft tissues and spinal canal: No soft tissue swelling or hematoma. Disc levels:  Mild disc degeneration and spurring C5-6 and C6-7 Upper chest: Negative Other: None IMPRESSION: Multiple areas of parenchymal hematoma in the brain. The largest hematoma in the right frontal lobe measures 4 x 5 cm with surrounding edema and 6 mm midline shift to the left. There is edema surrounding hemorrhages suggests in these may be subacute. Subarachnoid hemorrhage in the right sylvian fissure. Small tentorial subdural hematomas bilaterally. Findings most compatible with trauma. No skull fracture or soft tissue swelling in the head. Negative for facial fracture Negative for cervical spine fracture. These results were called by telephone at the time of interpretation on 01/29/2017 at 12:23 pm to Dr. Dorie Rank , who verbally acknowledged these results. Electronically Signed   By: Franchot Gallo M.D.   On: 01/29/2017 12:24    Review of Systems  Unable to  perform ROS: Mental status change    Blood pressure (!) 156/99, pulse 71, temperature 99 F (37.2 C), temperature source Oral, resp. rate (!) 26, height _0  (1.803 m), weight 98.4 kg (217 lb), SpO2 98 %. Physical Exam  Constitutional: He is oriented to person, place, and time. He appears well-developed and well-nourished. He appears lethargic. He is cooperative. He appears distressed.  HENT:  Head: Normocephalic. Head is with raccoon's eyes.    Right Ear: External ear normal.  Left Ear: External ear normal.  Right periorbital ecchymosis.  Eyes: Conjunctivae are normal. Pupils are equal, round, and reactive to light. Right eye exhibits no discharge. Left eye exhibits no discharge. No scleral icterus.  Neck: Normal range of motion. Neck supple. No JVD present. No tracheal deviation present. No thyromegaly present.  Cardiovascular: Normal rate, regular rhythm, normal heart sounds and intact distal pulses.   Respiratory: Effort normal and breath sounds normal. No stridor. No respiratory distress. He exhibits no tenderness.  GI: Soft. He exhibits no distension. There is no tenderness.  Musculoskeletal: Normal range of motion. He exhibits no edema or tenderness.  Lymphadenopathy:    He has no cervical adenopathy.  Neurological: He is oriented to person, place, and time. He has normal strength. He appears lethargic. No cranial nerve deficit. GCS eye subscore is 3. GCS verbal subscore is 3. GCS motor subscore is 6.  Skin: Skin is warm and dry. No rash noted. He is not diaphoretic. No erythema. No pallor.  Psychiatric: He has a normal mood and affect. His behavior is normal. Judgment and thought content normal.     Assessment/Plan TBI ICH/SDH bilaterally  Admit for neuro checks Repeat head CT Will need therapies. Sitter Check urine drug screen.    Margreat Widener 01/29/2017, 3:08 PM   Procedures

## 2017-01-29 NOTE — ED Notes (Signed)
Charge notified of pt getting out of bed. This Clinical research associatewriter placed order for tele sitter and called staffing to notify.

## 2017-01-29 NOTE — ED Notes (Signed)
ED Provider at bedside. 

## 2017-01-29 NOTE — Progress Notes (Signed)
Repeat head CT stable. Continue with current care and monitoring of neuro exam. Report worsening.

## 2017-01-30 LAB — BASIC METABOLIC PANEL
ANION GAP: 8 (ref 5–15)
BUN: 11 mg/dL (ref 6–20)
CALCIUM: 8.2 mg/dL — AB (ref 8.9–10.3)
CO2: 24 mmol/L (ref 22–32)
Chloride: 104 mmol/L (ref 101–111)
Creatinine, Ser: 0.83 mg/dL (ref 0.61–1.24)
GFR calc Af Amer: >60 mL/min (ref >60)
Glucose, Bld: 90 mg/dL (ref 65–99)
POTASSIUM: 3.1 mmol/L — AB (ref 3.5–5.1)
SODIUM: 136 mmol/L (ref 135–145)

## 2017-01-30 LAB — CBC
HCT: 42.8 % (ref 39.0–52.0)
HEMOGLOBIN: 14.7 g/dL (ref 13.0–17.0)
MCH: 30.9 pg (ref 26.0–34.0)
MCHC: 34.3 g/dL (ref 30.0–36.0)
MCV: 89.9 fL (ref 78.0–100.0)
PLATELETS: 184 10*3/uL (ref 150–400)
RBC: 4.76 MIL/uL (ref 4.22–5.81)
RDW: 13.3 % (ref 11.5–15.5)
WBC: 14 10*3/uL — ABNORMAL HIGH (ref 4.0–10.5)

## 2017-01-30 LAB — HIV ANTIBODY (ROUTINE TESTING W REFLEX): HIV SCREEN 4TH GENERATION: NONREACTIVE

## 2017-01-30 MED ORDER — POTASSIUM CHLORIDE CRYS ER 20 MEQ PO TBCR
20.0000 meq | EXTENDED_RELEASE_TABLET | Freq: Two times a day (BID) | ORAL | Status: DC
  Administered 2017-01-30 – 2017-02-03 (×9): 20 meq via ORAL
  Filled 2017-01-30 (×9): qty 1

## 2017-01-30 NOTE — Evaluation (Signed)
Physical Therapy Evaluation Patient Details Name: Lucas Williams MRN: [id number] DOB: 11/24/1975 Today's Date: 01/30/2017   History of Present Illness  Patient is a 41 y/o male who was found down by police. Head CT-Multiple areas of parenchymal hematoma in the brain. Largest hematoma in the right frontal lobe measures 4 x 5 cm with surrounding edema and 6 mm midline shift to the left. Follow up CT-unchanged.  Clinical Impression  Patient presents with headache, impaired memory, impulsivity, impaired awareness of safety and deficits, decreased problem solving and decreased functional mobility s/p above. Tolerated gait training with Min guard assist for safety. Balance worsened with multi tasking and head turns. Reports some blurry vision which is new. Not able to recall what lead to admission and confused about where he works, date etc. Will need post concussive education. Pt will have 24/7 supervision from sister at d/c. Will follow acutely to maximize independence and mobility prior to return home.    Follow Up Recommendations Supervision for mobility/OOB;No PT follow up    Equipment Recommendations  None recommended by PT    Recommendations for Other Services Speech consult (cognitive assessment)     Precautions / Restrictions Precautions Precautions: Fall Restrictions Weight Bearing Restrictions: No      Mobility  Bed Mobility Overal bed mobility: Modified Independent             General bed mobility comments: No assist needed. Mild dizziness reported. Resolved.   Transfers Overall transfer level: Needs assistance Equipment used: None Transfers: Sit to/from Stand Sit to Stand: Min guard         General transfer comment: Stood from EOB x1 without assist. Min guard to steady due to first time being up. Transferred to chair post ambulation.  Ambulation/Gait Ambulation/Gait assistance: Min guard Ambulation Distance (Feet): 150 Feet Assistive device: None Gait  Pattern/deviations: Step-through pattern;Decreased stride length;Staggering left;Staggering right;Wide base of support Gait velocity: unsafe at times.   General Gait Details: Fast, unsteady gait with staggering at times but able to correct for LOB. Balance worsens with head turns and multi tasking.  Stairs            Wheelchair Mobility    Modified Rankin (Stroke Patients Only) Modified Rankin (Stroke Patients Only) Pre-Morbid Rankin Score: No symptoms Modified Rankin: Moderately severe disability     Balance Overall balance assessment: Needs assistance Sitting-balance support: Feet supported;No upper extremity supported Sitting balance-Leahy Scale: Good Sitting balance - Comments: ABle to reach down and adjust socks without LOB.   Standing balance support: During functional activity Standing balance-Leahy Scale: Fair Standing balance comment: Able to stand and urinate as well as wash hands without UE support.                              Pertinent Vitals/Pain Pain Assessment: 0-10 Pain Score: 5  Pain Location: headache Pain Descriptors / Indicators: Headache;Sore;Constant Pain Intervention(s): Monitored during session;Repositioned    Home Living Family/patient expects to be discharged to:: Private residence Living Arrangements: Alone Available Help at Discharge: Family;Available 24 hours/day (sister can be present 24/7.) Type of Home: Apartment Home Access: Elevator     Home Layout: One level Home Equipment: None      Prior Function Level of Independence: Independent         Comments: Works at [employer] as a Investment banker, operational. Walks to work.     Hand Dominance        Extremity/Trunk Assessment   Upper Extremity  Assessment Upper Extremity Assessment: Defer to OT evaluation    Lower Extremity Assessment Lower Extremity Assessment: Overall WFL for tasks assessed    Cervical / Trunk Assessment Cervical / Trunk Assessment: Normal  Communication    Communication: No difficulties  Cognition Arousal/Alertness: Awake/alert Behavior During Therapy: Impulsive Overall Cognitive Status: Impaired/Different from baseline Area of Impairment: Orientation;Attention;Following commands;Safety/judgement;Awareness;Problem solving;Memory                 Orientation Level: Disoriented to;Time;Situation Current Attention Level: Selective Memory: Decreased short-term memory Following Commands: Follows multi-step commands consistently;Follows multi-step commands with increased time Safety/Judgement: Decreased awareness of deficits Awareness: Emergent Problem Solving: Slow processing General Comments: Reports it is 2002, difficulty problem solving the year even with cues as well as the Month. "It is not christmas" Able to get to May with max verbal cues. Pt masks not knowing answers with humor. Requires repetition of questions to elicit an answer. Delayed response time for questions. Reports he works at [employer] but sister said he does nto work there anymore and now works at [employer]. Impaired memory. No recollection of what happened. Remembers clocking out of work and that is all.      General Comments General comments (skin integrity, edema, etc.): Sister present during session. BP elevated sitting EOB 141/102.    Exercises     Assessment/Plan    PT Assessment Patient needs continued PT services  PT Problem List Decreased mobility;Decreased safety awareness;Decreased cognition;Decreased balance       PT Treatment Interventions Therapeutic activities;Gait training;Therapeutic exercise;Patient/family education;Balance training;Stair training;Functional mobility training;Neuromuscular re-education;Cognitive remediation    PT Goals (Current goals can be found in the Care Plan section)  Acute Rehab PT Goals Patient Stated Goal: to get out of here and go home PT Goal Formulation: With patient Time For Goal Achievement: 02/13/17 Potential to  Achieve Goals: Good    Frequency Min 4X/week   Barriers to discharge        Co-evaluation               AM-PAC PT "6 Clicks" Daily Activity  Outcome Measure Difficulty turning over in bed (including adjusting bedclothes, sheets and blankets)?: None Difficulty moving from lying on back to sitting on the side of the bed? : None Difficulty sitting down on and standing up from a chair with arms (e.g., wheelchair, bedside commode, etc,.)?: None Help needed moving to and from a bed to chair (including a wheelchair)?: A Little Help needed walking in hospital room?: A Little Help needed climbing 3-5 steps with a railing? : A Little 6 Click Score: 21    End of Session Equipment Utilized During Treatment: Gait belt Activity Tolerance: Patient tolerated treatment well Patient left: in chair;with call bell/phone within reach;with family/visitor present;with chair alarm set Nurse Communication: Mobility status PT Visit Diagnosis: Unsteadiness on feet (R26.81);Pain;Difficulty in walking, not elsewhere classified (R26.2);Other symptoms and signs involving the nervous system (R29.898) Pain - Right/Left:  (headache)    Time: [id number] PT Time Calculation (min) (ACUTE ONLY): 28 min   Charges:   PT Evaluation $PT Eval Moderate Complexity: 1 Procedure PT Treatments $Gait Training: 8-22 mins   PT G Codes:        Mylo RedShauna Bassem Bernasconi, PT, DPT [phone]    Blake DivineShauna A Jonnette Nuon 01/30/2017, 9:27 AM

## 2017-01-30 NOTE — Progress Notes (Signed)
Pt seen and examined. No issues overnight.   EXAM: Temp:  [97.9 F (36.6 C)-99 F (37.2 C)] 98.9 F (37.2 C) (05/13 0700) Pulse Rate:  [54-79] 57 (05/13 0400) Resp:  [18-30] 27 (05/13 0700) BP: (131-166)/(72-103) 141/98 (05/13 0900) SpO2:  [95 %-100 %] 98 % (05/13 0700) Weight:  [98.4 kg (217 lb)-103.2 kg (227 lb 8.2 oz)] 103.2 kg (227 lb 8.2 oz) (05/12 1633) Intake/Output      05/12 0701 - 05/13 0700 05/13 0701 - 05/14 0700   I.V. (mL/kg) 2331.3 (22.6) 125 (1.2)   IV Piggyback 205    Total Intake(mL/kg) 2536.3 (24.6) 125 (1.2)   Urine (mL/kg/hr) 500 900 (3)   Total Output 500 900   Net +2036.3 -775        Urine Occurrence 1 x     Awake, alert, oriented x3 Full strength, no drift  LABS: Lab Results  Component Value Date   CREATININE 0.83 01/30/2017   BUN 11 01/30/2017   NA 136 01/30/2017   K 3.1 (L) 01/30/2017   CL 104 01/30/2017   CO2 24 01/30/2017   Lab Results  Component Value Date   WBC 14.0 (H) 01/30/2017   HGB 14.7 01/30/2017   HCT 42.8 01/30/2017   MCV 89.9 01/30/2017   PLT 184 01/30/2017    IMAGING: Repeat head CT stable  IMPRESSION: - 41 y.o. male with mild TBI, large right frontal contusion, small left frontal and right temporal contusions. Neurologically stable.  PLAN: - Keep in ICU for observation for at least one more day - Will continue to follow
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
## 2017-01-31 LAB — COMPREHENSIVE METABOLIC PANEL
ALK PHOS: 52 U/L (ref 38–126)
ALT: 178 U/L — AB (ref 17–63)
AST: 235 U/L — ABNORMAL HIGH (ref 15–41)
Albumin: 3.2 g/dL — ABNORMAL LOW (ref 3.5–5.0)
Anion gap: 10 (ref 5–15)
BUN: 8 mg/dL (ref 6–20)
CALCIUM: 8.3 mg/dL — AB (ref 8.9–10.3)
CO2: 25 mmol/L (ref 22–32)
CREATININE: 0.82 mg/dL (ref 0.61–1.24)
Chloride: 102 mmol/L (ref 101–111)
Glucose, Bld: 93 mg/dL (ref 65–99)
Potassium: 3.7 mmol/L (ref 3.5–5.1)
Sodium: 137 mmol/L (ref 135–145)
Total Bilirubin: 1 mg/dL (ref 0.3–1.2)
Total Protein: 6.7 g/dL (ref 6.5–8.1)

## 2017-01-31 LAB — CBC
HCT: 42.3 % (ref 39.0–52.0)
Hemoglobin: 14.5 g/dL (ref 13.0–17.0)
MCH: 31 pg (ref 26.0–34.0)
MCHC: 34.3 g/dL (ref 30.0–36.0)
MCV: 90.4 fL (ref 78.0–100.0)
Platelets: 185 10*3/uL (ref 150–400)
RBC: 4.68 MIL/uL (ref 4.22–5.81)
RDW: 13.1 % (ref 11.5–15.5)
WBC: 12 10*3/uL — AB (ref 4.0–10.5)

## 2017-01-31 MED ORDER — HYDROMORPHONE HCL 1 MG/ML IJ SOLN: 0.5000 mg | INTRAMUSCULAR | Status: DC | PRN

## 2017-01-31 MED ORDER — ADULT MULTIVITAMIN W/MINERALS CH
1.0000 | ORAL_TABLET | Freq: Every day | ORAL | Status: DC
  Administered 2017-01-31 – 2017-02-03 (×4): 1 via ORAL
  Filled 2017-01-31 (×4): qty 1

## 2017-01-31 MED ORDER — VITAMIN B-1 100 MG PO TABS
100.0000 mg | ORAL_TABLET | Freq: Every day | ORAL | Status: DC
  Administered 2017-01-31 – 2017-02-03 (×4): 100 mg via ORAL
  Filled 2017-01-31 (×4): qty 1

## 2017-01-31 MED ORDER — LORAZEPAM 1 MG PO TABS: 1.0000 mg | ORAL_TABLET | Freq: Four times a day (QID) | ORAL | Status: AC | PRN

## 2017-01-31 MED ORDER — LORAZEPAM 1 MG PO TABS
0.0000 mg | ORAL_TABLET | Freq: Four times a day (QID) | ORAL | Status: AC
  Administered 2017-02-02: 1 mg via ORAL
  Filled 2017-01-31: qty 1

## 2017-01-31 MED ORDER — LORAZEPAM 2 MG/ML IJ SOLN: 1.0000 mg | Freq: Four times a day (QID) | INTRAMUSCULAR | Status: AC | PRN

## 2017-01-31 MED ORDER — LORAZEPAM 1 MG PO TABS
0.0000 mg | ORAL_TABLET | Freq: Four times a day (QID) | ORAL | Status: DC
Start: 2017-01-31 — End: 2017-01-31

## 2017-01-31 MED ORDER — LORAZEPAM 1 MG PO TABS
0.0000 mg | ORAL_TABLET | Freq: Two times a day (BID) | ORAL | Status: DC
Start: 2017-02-02 — End: 2017-02-03
  Filled 2017-01-31: qty 1

## 2017-01-31 MED ORDER — NICOTINE 21 MG/24HR TD PT24
21.0000 mg | MEDICATED_PATCH | Freq: Every day | TRANSDERMAL | Status: DC
  Administered 2017-01-31 – 2017-02-03 (×4): 21 mg via TRANSDERMAL
  Filled 2017-01-31 (×4): qty 1

## 2017-01-31 MED ORDER — LORAZEPAM 1 MG PO TABS: 0.0000 mg | ORAL_TABLET | Freq: Two times a day (BID) | ORAL | Status: DC

## 2017-01-31 MED ORDER — THIAMINE HCL 100 MG/ML IJ SOLN
100.0000 mg | Freq: Every day | INTRAMUSCULAR | Status: DC
  Filled 2017-01-31 (×2): qty 2

## 2017-01-31 MED ORDER — FOLIC ACID 1 MG PO TABS
1.0000 mg | ORAL_TABLET | Freq: Every day | ORAL | Status: DC
  Administered 2017-01-31 – 2017-02-03 (×4): 1 mg via ORAL
  Filled 2017-01-31 (×4): qty 1

## 2017-01-31 NOTE — Progress Notes (Addendum)
/ Trauma Service Note  Subjective: Patient is remarkably alert considering what his head CT looks like  Objective: Vital signs in last 24 hours: Temp:  [98.6 F (37 C)-98.8 F (37.1 C)] 98.7 F (37.1 C) (05/14 0800) Resp:  [16-30] 21 (05/14 0800) BP: (123-160)/(66-109) 144/90 (05/14 0800) SpO2:  [97 %] 97 % (05/14 0800)    Intake/Output from previous day: 05/13 0701 - 05/14 0700 In: 2660 [P.O.:675; I.V.:1775; IV Piggyback:210] Out: 1400 [Urine:1400] Intake/Output this shift: Total I/O In: 75 [I.V.:75] Out: 400 [Urine:400]  General: Awake, oriented, alert  Lungs: Clear  Abd: Benign  Extremities: No changes  Neuro: GCS 15, Moves all extremities.  Lab Results: CBC   Recent Labs  01/30/17 0209 01/31/17 0609  WBC 14.0* 12.0*  HGB 14.7 14.5  HCT 42.8 42.3  PLT 184 185   BMET  Recent Labs  01/30/17 0209 01/31/17 0609  NA 136 137  K 3.1* 3.7  CL 104 102  CO2 24 25  GLUCOSE 90 93  BUN 11 8  CREATININE 0.83 0.82  CALCIUM 8.2* 8.3*   PT/INR  Recent Labs  01/29/17 1235  LABPROT 14.7  INR 1.14   ABG No results for input(s): PHART, HCO3 in the last 72 hours.  Invalid input(s): PCO2, PO2  Studies/Results: Ct Head Wo Contrast  Result Date: 01/29/2017 CLINICAL DATA:  Followup subdural hematoma. Initial encounter. EXAM: CT HEAD WITHOUT CONTRAST TECHNIQUE: Contiguous axial images were obtained from the base of the skull through the vertex without intravenous contrast. COMPARISON:  Head CT from earlier today FINDINGS: Brain: Confluent hemorrhagic cerebral contusions in the right more than left inferior frontal and anterior temporal lobes is unchanged. Due to irregular shape, hematoma measurement is difficult. Stable vasogenic edema. Small posterior right cerebral hemorrhagic contusion is also stable. Subarachnoid hemorrhage again seen in the right sylvian fissure. Midline shift continues to measure 6 mm. No ACA territory infarct. No entrapment Vascular: No  acute finding Skull: Nondisplaced vertical occipital bone fracture Sinuses/Orbits: Negative IMPRESSION: 1. Unchanged extensive hemorrhagic cerebral contusions as described. Stable subarachnoid hemorrhage in the right sylvian fissure. 2. Unchanged 6 mm of leftward midline shift and increased intracranial pressure with diffuse sulcal effacement. 3. Vertical, nondepressed occipital bone fracture. Electronically Signed   By: Marnee SpringJonathon  Watts M.D.   On: 01/29/2017 18:51   Ct Head Wo Contrast  Result Date: 01/29/2017 CLINICAL DATA:  Altered mental status EXAM: CT HEAD WITHOUT CONTRAST CT MAXILLOFACIAL WITHOUT CONTRAST CT CERVICAL SPINE WITHOUT CONTRAST TECHNIQUE: Multidetector CT imaging of the head, cervical spine, and maxillofacial structures were performed using the standard protocol without intravenous contrast. Multiplanar CT image reconstructions of the cervical spine and maxillofacial structures were also generated. COMPARISON:  None. FINDINGS: CT HEAD FINDINGS Brain: Multiple areas of acute hemorrhage in the brain. The largest area of hemorrhage is in the right frontal lobe. High-density hemorrhage in the inferior right frontal lobe with surrounding low-density edema. This area measures approximately 4 x 5 cm. Smaller area of acute hemorrhage in the left frontal lobe. 2 cm hemorrhage in the right anterior temporal lobe and 1 cm hemorrhage in the left temporal lobe. 1 cm area of hemorrhage in the right occipital lobe. Subarachnoid hemorrhage in the sylvian fissure on the right. Small tentorial subdural hematoma bilaterally. Ventricles normal in size. 6 mm midline shift to the left due to mass-effect from right-sided hemorrhage and edema. Vascular: Negative for hyperdense vessel. Skull: Negative for skull fracture. Other: Mild mucosal edema in the paranasal sinuses. No significant soft tissue  swelling in the face or head. CT MAXILLOFACIAL FINDINGS Osseous: Negative for facial fracture. No fracture of the orbit or  mandible or nasal bone. Orbits: Negative Sinuses: Mild mucosal edema in the paranasal sinuses. Negative for air-fluid level. Soft tissues: No significant soft tissue swelling in the face. CT CERVICAL SPINE FINDINGS Alignment: Normal Skull base and vertebrae: Negative for fracture. Soft tissues and spinal canal: No soft tissue swelling or hematoma. Disc levels:  Mild disc degeneration and spurring C5-6 and C6-7 Upper chest: Negative Other: None IMPRESSION: Multiple areas of parenchymal hematoma in the brain. The largest hematoma in the right frontal lobe measures 4 x 5 cm with surrounding edema and 6 mm midline shift to the left. There is edema surrounding hemorrhages suggests in these may be subacute. Subarachnoid hemorrhage in the right sylvian fissure. Small tentorial subdural hematomas bilaterally. Findings most compatible with trauma. No skull fracture or soft tissue swelling in the head. Negative for facial fracture Negative for cervical spine fracture. These results were called by telephone at the time of interpretation on 01/29/2017 at 12:23 pm to Dr. Linwood Dibbles , who verbally acknowledged these results. Electronically Signed   By: Marlan Palau M.D.   On: 01/29/2017 12:24   Ct Cervical Spine Wo Contrast  Result Date: 01/29/2017 CLINICAL DATA:  Altered mental status EXAM: CT HEAD WITHOUT CONTRAST CT MAXILLOFACIAL WITHOUT CONTRAST CT CERVICAL SPINE WITHOUT CONTRAST TECHNIQUE: Multidetector CT imaging of the head, cervical spine, and maxillofacial structures were performed using the standard protocol without intravenous contrast. Multiplanar CT image reconstructions of the cervical spine and maxillofacial structures were also generated. COMPARISON:  None. FINDINGS: CT HEAD FINDINGS Brain: Multiple areas of acute hemorrhage in the brain. The largest area of hemorrhage is in the right frontal lobe. High-density hemorrhage in the inferior right frontal lobe with surrounding low-density edema. This area measures  approximately 4 x 5 cm. Smaller area of acute hemorrhage in the left frontal lobe. 2 cm hemorrhage in the right anterior temporal lobe and 1 cm hemorrhage in the left temporal lobe. 1 cm area of hemorrhage in the right occipital lobe. Subarachnoid hemorrhage in the sylvian fissure on the right. Small tentorial subdural hematoma bilaterally. Ventricles normal in size. 6 mm midline shift to the left due to mass-effect from right-sided hemorrhage and edema. Vascular: Negative for hyperdense vessel. Skull: Negative for skull fracture. Other: Mild mucosal edema in the paranasal sinuses. No significant soft tissue swelling in the face or head. CT MAXILLOFACIAL FINDINGS Osseous: Negative for facial fracture. No fracture of the orbit or mandible or nasal bone. Orbits: Negative Sinuses: Mild mucosal edema in the paranasal sinuses. Negative for air-fluid level. Soft tissues: No significant soft tissue swelling in the face. CT CERVICAL SPINE FINDINGS Alignment: Normal Skull base and vertebrae: Negative for fracture. Soft tissues and spinal canal: No soft tissue swelling or hematoma. Disc levels:  Mild disc degeneration and spurring C5-6 and C6-7 Upper chest: Negative Other: None IMPRESSION: Multiple areas of parenchymal hematoma in the brain. The largest hematoma in the right frontal lobe measures 4 x 5 cm with surrounding edema and 6 mm midline shift to the left. There is edema surrounding hemorrhages suggests in these may be subacute. Subarachnoid hemorrhage in the right sylvian fissure. Small tentorial subdural hematomas bilaterally. Findings most compatible with trauma. No skull fracture or soft tissue swelling in the head. Negative for facial fracture Negative for cervical spine fracture. These results were called by telephone at the time of interpretation on 01/29/2017 at 12:23 pm  to Dr. Linwood Dibbles , who verbally acknowledged these results. Electronically Signed   By: Marlan Palau M.D.   On: 01/29/2017 12:24   Dg Chest  Portable 1 View  Result Date: 01/29/2017 CLINICAL DATA:  Trauma, initial encounter. EXAM: PORTABLE CHEST 1 VIEW COMPARISON:  10/20/2015. FINDINGS: Trachea is midline. Heart size within normal limits. Lungs are low in volume but clear. No pleural fluid. No pneumothorax. Osseous structures appear grossly intact. IMPRESSION: Negative. Electronically Signed   By: Leanna Battles M.D.   On: 01/29/2017 12:23   Ct Maxillofacial Wo Contrast  Result Date: 01/29/2017 CLINICAL DATA:  Altered mental status EXAM: CT HEAD WITHOUT CONTRAST CT MAXILLOFACIAL WITHOUT CONTRAST CT CERVICAL SPINE WITHOUT CONTRAST TECHNIQUE: Multidetector CT imaging of the head, cervical spine, and maxillofacial structures were performed using the standard protocol without intravenous contrast. Multiplanar CT image reconstructions of the cervical spine and maxillofacial structures were also generated. COMPARISON:  None. FINDINGS: CT HEAD FINDINGS Brain: Multiple areas of acute hemorrhage in the brain. The largest area of hemorrhage is in the right frontal lobe. High-density hemorrhage in the inferior right frontal lobe with surrounding low-density edema. This area measures approximately 4 x 5 cm. Smaller area of acute hemorrhage in the left frontal lobe. 2 cm hemorrhage in the right anterior temporal lobe and 1 cm hemorrhage in the left temporal lobe. 1 cm area of hemorrhage in the right occipital lobe. Subarachnoid hemorrhage in the sylvian fissure on the right. Small tentorial subdural hematoma bilaterally. Ventricles normal in size. 6 mm midline shift to the left due to mass-effect from right-sided hemorrhage and edema. Vascular: Negative for hyperdense vessel. Skull: Negative for skull fracture. Other: Mild mucosal edema in the paranasal sinuses. No significant soft tissue swelling in the face or head. CT MAXILLOFACIAL FINDINGS Osseous: Negative for facial fracture. No fracture of the orbit or mandible or nasal bone. Orbits: Negative Sinuses:  Mild mucosal edema in the paranasal sinuses. Negative for air-fluid level. Soft tissues: No significant soft tissue swelling in the face. CT CERVICAL SPINE FINDINGS Alignment: Normal Skull base and vertebrae: Negative for fracture. Soft tissues and spinal canal: No soft tissue swelling or hematoma. Disc levels:  Mild disc degeneration and spurring C5-6 and C6-7 Upper chest: Negative Other: None IMPRESSION: Multiple areas of parenchymal hematoma in the brain. The largest hematoma in the right frontal lobe measures 4 x 5 cm with surrounding edema and 6 mm midline shift to the left. There is edema surrounding hemorrhages suggests in these may be subacute. Subarachnoid hemorrhage in the right sylvian fissure. Small tentorial subdural hematomas bilaterally. Findings most compatible with trauma. No skull fracture or soft tissue swelling in the head. Negative for facial fracture Negative for cervical spine fracture. These results were called by telephone at the time of interpretation on 01/29/2017 at 12:23 pm to Dr. Linwood Dibbles , who verbally acknowledged these results. Electronically Signed   By: Marlan Palau M.D.   On: 01/29/2017 12:24    Anti-infectives: Anti-infectives    None      Assessment/Plan: s/p  Advance diet Rehab consultation\  Transfer to the [facility] with telemetry  LOS: 2 days   Marta Lamas. Gae Bon, MD, FACS [phone] Trauma Surgeon 01/31/2017
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
## 2017-01-31 NOTE — Progress Notes (Signed)
Pt seen and examined. No issues overnight.   EXAM: Temp:  [98.6 F (37 C)-98.8 F (37.1 C)] 98.7 F (37.1 C) (05/14 0700) Resp:  [16-30] 23 (05/14 0700) BP: (123-160)/(66-109) 143/83 (05/14 0613) SpO2:  [97 %] 97 % (05/14 0700) Intake/Output      05/13 0701 - 05/14 0700 05/14 0701 - 05/15 0700   P.O. 675    I.V. (mL/kg) 1775 (17.2)    IV Piggyback 210    Total Intake(mL/kg) 2660 (25.8)    Urine (mL/kg/hr) 1400 (0.6) 400 (4.5)   Total Output 1400 400   Net +1260 -400        Urine Occurrence 4 x     Awake, alert, oriented x3 Moving all extremities, no drift  LABS: Lab Results  Component Value Date   CREATININE 0.82 01/31/2017   BUN 8 01/31/2017   NA 137 01/31/2017   K 3.7 01/31/2017   CL 102 01/31/2017   CO2 25 01/31/2017   Lab Results  Component Value Date   WBC 12.0 (H) 01/31/2017   HGB 14.5 01/31/2017   HCT 42.3 01/31/2017   MCV 90.4 01/31/2017   PLT 185 01/31/2017    IMPRESSION: - 41 y.o. male with mild TBI but large contusion burden, neurologically well.  PLAN: - Ok to transfer to the floor - Keep one more day at least - Minimize activities

## 2017-01-31 NOTE — Progress Notes (Signed)
Patient transferred to 5C08. Tele sitter at bedside and notified of transfer, patient oriented x4, tele monitoring set up.
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
## 2017-01-31 NOTE — Evaluation (Signed)
Speech Language Pathology Evaluation Patient Details Name: Lucas Williams MRN: [id number] DOB: [birth year] Today's Date: 01/31/2017 Time: 1035-1100 SLP Time Calculation (min) (ACUTE ONLY): 25 min  Problem List:  Patient Active Problem List   Diagnosis Date Noted  . TBI (traumatic brain injury) (HCC) 01/29/2017   Past Medical History:  Past Medical History:  Diagnosis Date  . Laceration of hand, left    Past Surgical History:  Past Surgical History:  Procedure Laterality Date  . FINGER SURGERY     r/t staph infection I&D, hand lac cutting trees  . HERNIA REPAIR     LT ihr  . NERVE REPAIR Left 12/11/2012   Procedure: Left Hand Exploration Wound with Nerve Repair;  Surgeon: Tami RibasKevin R Kuzma, MD;  Location: [facility];  Service: Orthopedics;  Laterality: Left;   HPI:  41 year old [place]. PMH unremarkable. CT revealed extensive hemorrhagic cerebral contusions, stable SAH right sylvian fissure. SLE ordered to evaluate cognition.   Assessment / Plan / Recommendation Clinical Impression  THe Montreal Cognitive Assessment (MoCA) was administered. Pt scored 21/30 (n=26+/30), indicating mild cognitive impairment. Difficulty noted on the following subtests: thought organization/verbal fluency, abstract reasoning, delayed recall, and attention. 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 Pt will likely benefit from continued ST intervention after DC from acute care, either through home health or outpatient therapy.     SLP Assessment  SLP Recommendation/Assessment: Patient needs continued Speech Language Pathology Services SLP Visit Diagnosis: Cognitive communication deficit (R41.841)    Follow Up Recommendations  Home health SLP;Outpatient SLP    Frequency and Duration min 1 x/week  2 weeks      SLP Evaluation Cognition  Overall Cognitive  Status: Impaired/Different from baseline Arousal/Alertness: Lethargic Orientation Level: Oriented to place;Oriented to person;Disoriented to situation;Oriented to time Attention: Focused;Sustained;Selective;Divided;Alternating Focused Attention: Appears intact Sustained Attention: Appears intact Selective Attention: Impaired Selective Attention Impairment: Verbal basic Alternating Attention:  (anticipate deficits) Divided Attention:  (anticipate deficits) Memory: Impaired Memory Impairment: Retrieval deficit;Storage deficit;Decreased recall of new information;Decreased short term memory Decreased Short Term Memory: Verbal basic Awareness: Impaired Awareness Impairment: Intellectual impairment;Emergent impairment;Anticipatory impairment Problem Solving: Impaired Problem Solving Impairment: Verbal basic Executive Function: Reasoning;Sequencing;Organizing Reasoning: Impaired Reasoning Impairment: Verbal basic Sequencing: Impaired Sequencing Impairment: Verbal basic Organizing: Impaired Organizing Impairment: Verbal basic Behaviors: Poor frustration tolerance Safety/Judgment: Impaired       Comprehension  Auditory Comprehension Overall Auditory Comprehension: Appears within functional limits for tasks assessed Yes/No Questions: Within Functional Limits Commands: Within Functional Limits Visual Recognition/Discrimination Discrimination: Within Function Limits (visuoperception subtest of MoCA) Reading Comprehension Reading Status: Not tested    Expression Expression Primary Mode of Expression: Verbal Verbal Expression Overall Verbal Expression: Appears within functional limits for tasks assessed Initiation: No impairment Level of Generative/Spontaneous Verbalization: Sentence Repetition: No impairment Naming: No impairment   Oral / Motor  Oral Motor/Sensory Function Overall Oral Motor/Sensory Function: Within functional limits Motor Speech Overall Motor Speech: Appears  within functional limits for tasks assessed   GO                   Artasia Thang B. Murvin NatalBueche, [facility] State HospitalMSP, CCC-SLP [phone]  Leigh AuroraBueche, Kaycee Haycraft Brown 01/31/2017, 11:20 AM

## 2017-02-01 LAB — BASIC METABOLIC PANEL
ANION GAP: 11 (ref 5–15)
BUN: 9 mg/dL (ref 6–20)
CHLORIDE: 100 mmol/L — AB (ref 101–111)
CO2: 24 mmol/L (ref 22–32)
Calcium: 9.2 mg/dL (ref 8.9–10.3)
Creatinine, Ser: 0.84 mg/dL (ref 0.61–1.24)
Glucose, Bld: 98 mg/dL (ref 65–99)
Potassium: 3.9 mmol/L (ref 3.5–5.1)
SODIUM: 135 mmol/L (ref 135–145)

## 2017-02-01 MED ORDER — TRAMADOL HCL 50 MG PO TABS
50.0000 mg | ORAL_TABLET | Freq: Four times a day (QID) | ORAL | Status: DC
  Administered 2017-02-01 – 2017-02-03 (×9): 50 mg via ORAL
  Filled 2017-02-01 (×9): qty 1

## 2017-02-01 MED ORDER — OXYCODONE HCL 5 MG PO TABS
5.0000 mg | ORAL_TABLET | ORAL | Status: DC | PRN
  Administered 2017-02-01 (×2): 10 mg via ORAL
  Administered 2017-02-02: 15 mg via ORAL
  Administered 2017-02-03: 10 mg via ORAL
  Filled 2017-02-01 (×3): qty 2
  Filled 2017-02-01: qty 3
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
## 2017-02-01 NOTE — Progress Notes (Addendum)
Pt seen and examined.  No issues overnight. States headache is improving each day No new symptoms to report  EXAM: Temp:  [98.2 F (36.8 C)-99.5 F (37.5 C)] 98.2 F (36.8 C) (05/15 1021) Pulse Rate:  [49-56] 56 (05/15 1021) Resp:  [18] 18 (05/15 1021) BP: (150-158)/(68-97) 157/97 (05/15 1021) SpO2:  [97 %-100 %] 100 % (05/15 1021) Intake/Output      05/14 0701 - 05/15 0700 05/15 0701 - 05/16 0700   P.O.  240   I.V. (mL/kg) 75 (0.7)    IV Piggyback     Total Intake(mL/kg) 75 (0.7) 240 (2.3)   Urine (mL/kg/hr) 400 (0.2)    Total Output 400     Net -325 +240        Urine Occurrence 1 x     Awake and alert Oriented to person, place and year Follows commands throughout Full strength PERRL.  Speech fluent.   Plan No new NS issues Per OT still needs a lot of direction Continue current care Will sign off. F/U outpt  Call for any concerns
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
## 2017-02-02 NOTE — Progress Notes (Signed)
Trauma Service Note  Subjective: Patient laying on his left side. Complaining of a headache.  Objective: Vital signs in last 24 hours: Temp:  [97.9 F (36.6 C)-98.7 F (37.1 C)] 97.9 F (36.6 C) (05/16 0935) Pulse Rate:  [50-61] 55 (05/16 0935) Resp:  [18-20] 20 (05/16 0935) BP: (132-157)/(79-95) 132/94 (05/16 0935) SpO2:  [97 %-100 %] 97 % (05/16 0935) Last BM Date:  (pt unable to recall )  Intake/Output from previous day: 05/15 0701 - 05/16 0700 In: 720 [P.O.:720] Out: -  Intake/Output this shift: No intake/output data recorded.  General: Headache  Lungs: Clear  Abd: Benign  Extremities: No clinical signs or symptoms of DVT  Neuro: Intact.  No focal deficits.  Lab Results: CBC   Recent Labs  01/31/17 0609  WBC 12.0*  HGB 14.5  HCT 42.3  PLT 185   BMET  Recent Labs  01/31/17 0609 02/01/17 0731  NA 137 135  K 3.7 3.9  CL 102 100*  CO2 25 24  GLUCOSE 93 98  BUN 8 9  CREATININE 0.82 0.84  CALCIUM 8.3* 9.2   PT/INR No results for input(s): LABPROT, INR in the last 72 hours. ABG No results for input(s): PHART, HCO3 in the last 72 hours.  Invalid input(s): PCO2, PO2  Studies/Results: No results found.  Anti-infectives: Anti-infectives    None      Assessment/Plan: s/p  Discharge Patient has stated that he can have his sister supervise him for 24/7.  Should be able to go home later this afternoon.  LOS: 4 days   Marta LamasJames O. Gae BonWyatt, III, MD, FACS [phone] Trauma Surgeon 02/02/2017

## 2017-02-02 NOTE — Progress Notes (Signed)
Pt has refused to keep telemetry monitoring on. Attempted several times to reapply but he continued to refuse. Called Md received verbal order to dc telemetry monitoring. Will monitor HR -59 at this time. Pt resting at this time.
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
## 2017-02-02 NOTE — Progress Notes (Signed)
Occupational Therapy Evaluation (late entry as eval not populted)  Pt admitted with below listed diagnosis and deficits.  He presents to OT with behaviors consistent with Ranchos Level VII.   He has decreased orientation, impaired memory, decreased problem solving and impaired awareness and safety awareness.   He lived alone PTA, and reports his sister will stay with him at discharge.  He currently requires min guard assist - min A for ADLs.   He will need 24 hour supervision at discharge, and recommend OPOT.  No DME needs   01/31/17 1200  OT Visit Information  Last OT Received On 01/31/17  Assistance Needed +1  History of Present Illness Patient is a 41 y/o male who was found down by police. Head CT-Multiple areas of parenchymal hematoma in the brain. Largest hematoma in the right frontal lobe measures 4 x 5 cm with surrounding edema and 6 mm midline shift to the left. Follow up CT-unchanged.  Precautions  Precautions Fall  Home Living  Family/patient expects to be discharged to: Private residence  Living Arrangements Alone  Available Help at Discharge Family;Available 24 hours/day  Type of Home Apartment  Home Access Elevator  Home Layout One level  Bathroom Shower/Tub Tub/shower unit  Tour managerBathroom Toilet Standard  Home Equipment None  Lives With Alone  Prior Function  Level of Independence Independent  Comments Pt works at [employer].  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"  Communication  Communication No difficulties  Pain Assessment  Pain Assessment Faces  Faces Pain Scale 6  Pain Location headache  Pain Descriptors / Indicators Grimacing;Guarding;Headache  Pain Intervention(s) Monitored during session;Limited activity within patient's tolerance  Cognition  Arousal/Alertness Awake/alert  Behavior During Therapy Impulsive  Overall Cognitive Status Impaired/Different from baseline  Area of Impairment Orientation;Attention;Memory;Following  commands;Safety/judgement;Awareness;Problem solving;Rancho level  Orientation Level Disoriented to;Time  Current Attention Level Selective (with cues )  Memory Decreased short-term memory  Following Commands Follows one step commands consistently;Follows multi-step commands with increased time  Safety/Judgement Decreased awareness of deficits;Decreased awareness of safety  Awareness Emergent  Problem Solving Slow processing;Difficulty sequencing;Requires verbal cues;Requires tactile cues  General Comments Pt reports it's "July, or maybe September".   He is very impulsive.  He is internally distracted by pain.  He requires mod cues to problem solve through alternate methods of performing activities that would reduce headache pain   Rancho Levels of Cognitive Functioning  Rancho Los Amigos Scales of Cognitive Functioning VII  Upper Extremity Assessment  Upper Extremity Assessment Overall WFL for tasks assessed  Lower Extremity Assessment  Lower Extremity Assessment Defer to PT evaluation  Cervical / Trunk Assessment  Cervical / Trunk Assessment Normal  ADL  Overall ADL's  Needs assistance/impaired  Eating/Feeding Independent  Grooming Wash/dry hands;Wash/dry face;Oral care;Brushing hair;Min guard;Standing  Grooming Details (indicate cue type and reason) cues to attend to task   Upper Body Bathing Supervision/ safety;Sitting  Lower Body Bathing Minimal assistance;Sit to/from stand  Lower Body Bathing Details (indicate cue type and reason) unable to access feet due to pain   Upper Body Dressing  Minimal assistance;Sitting  Lower Body Dressing Minimal assistance;Sit to/from stand  Lower Body Dressing Details (indicate cue type and reason) Pt able to doff socks - leans forward to do so causing increased pain.   He reports pain too severe to attempt donning.  Was unable to problem solve through alternalte method such as crossing ankles over knees which he was able to do after instructions  provided   Toilet  Transfer Min guard;Ambulation;Comfort height toilet  Toileting- Architect and Hygiene Min guard;Sit to/from stand  Functional mobility during ADLs Min guard  Vision- History  Baseline Vision/History No visual deficits  Vision- Assessment  Additional Comments Pt able to read clock and read signage in hallways.  Pt with difficulty sustaining attention to complete full visual assessment - frequently closes eyes complaining of headache.  Photophobia mildly present   Perception  Perception Tested? Yes  Praxis  Praxis tested? WFL  Bed Mobility  Overal bed mobility Modified Independent  Transfers  Overall transfer level Needs assistance  Equipment used None  Transfers Sit to/from Stand;Stand Pivot Transfers  Sit to Stand Min guard  Stand pivot transfers Min guard  Balance  Overall balance assessment Needs assistance  Sitting-balance support Feet supported;No upper extremity supported  Sitting balance-Leahy Scale Good  Standing balance support During functional activity  Standing balance-Leahy Scale Fair  General Comments  General comments (skin integrity, edema, etc.) reviewed basic info re: TBI.  Pt will need further info   OT - End of Session  Activity Tolerance Patient limited by pain  Patient left in chair;with nursing/sitter in room  Nurse Communication Mobility status  OT Assessment  OT Recommendation/Assessment Patient needs continued OT Services  OT Visit Diagnosis Unsteadiness on feet (R26.81);Cognitive communication deficit (R41.841);Pain  Pain - part of body (head )  OT Problem List Decreased activity tolerance;Impaired balance (sitting and/or standing);Impaired vision/perception;Decreased cognition;Decreased safety awareness;Pain  Barriers to Discharge Comments pt reports sister can provided 24 hour supervision - sister not present to confirm   OT Plan  OT Frequency (ACUTE ONLY) Min 3X/week  OT Treatment/Interventions (ACUTE ONLY)  Self-care/ADL training;DME and/or AE instruction;Therapeutic activities;Cognitive remediation/compensation;Visual/perceptual remediation/compensation;Patient/family education;Balance training  AM-PAC OT "6 Clicks" Daily Activity Outcome Measure  Help from another person eating meals? 4  Help from another person taking care of personal grooming? 3  Help from another person toileting, which includes using toliet, bedpan, or urinal? 3  Help from another person bathing (including washing, rinsing, drying)? 3  Help from another person to put on and taking off regular upper body clothing? 3  Help from another person to put on and taking off regular lower body clothing? 3  6 Click Score 19  ADL G Code Conversion CK  OT Recommendation  Follow Up Recommendations Outpatient OT;Supervision/Assistance - 24 hour  OT Equipment None recommended by OT  Individuals Consulted  Consulted and Agree with Results and Recommendations Patient  Acute Rehab OT Goals  Patient Stated Goal I want to go home   OT Goal Formulation With patient  Time For Goal Achievement 02/14/17  Potential to Achieve Goals Good  OT Time Calculation  OT Start Time (ACUTE ONLY) 1115  OT Stop Time (ACUTE ONLY) 1134  OT Time Calculation (min) 19 min  OT General Charges  $OT Visit 1 Procedure  OT Evaluation  $OT Eval Moderate Complexity 1 Procedure  Written Expression  Dominant Hand Right  Jeani Hawking, OTR/L [phone]

## 2017-02-02 NOTE — Progress Notes (Signed)
Telemetry tech notified that telemetry dc per Md verbal order due to pt refusal to keep telemetry monitor on.

## 2017-02-02 NOTE — Clinical Social Work Note (Signed)
Clinical Social Work Assessment  Patient Details  Name: Lucas Williams MRN: [id number] Date of Birth: 04-Aug-1976  Date of referral:  02/02/17               Reason for consult:  Trauma                Permission sought to share information with:  Family Supports Permission granted to share information::  Yes, Verbal Permission Granted  Name::     Lucas Williams  Agency::     Relationship::  Sister  Contact Information:  [phone]  Housing/Transportation Living arrangements for the past 2 months:  Single Family Home Source of Information:  Patient Patient Interpreter Needed:  None Criminal Activity/Legal Involvement Pertinent to Current Situation/Hospitalization:  No - Comment as needed Significant Relationships:  Siblings Lives with:  Siblings Do you feel safe going back to the place where you live?  Yes Need for family participation in patient care:  No (Coment)  Care giving concerns:  No care giving concerns identified.    Social Worker assessment / plan:  CSW met with pt at bedside to address consult for trauma/SBIRT. CSW introduced self and explained SW responsibilities. Pt lathargic and in an out of sleep. Pt rec supervision for mobility/OOB; No PT follow up. Pt aware of PT recs and sister will be providing supervision for pt. Sister lived with pt before accident. Pt does not need CSW to call family. SBIRT completed. CSW signing off as no other social work needs identified.   Employment status:  [employer] information:  Self Pay (Medicaid Pending) (Medicaid Potential) PT Recommendations:  24 Hour Supervision Information / Referral to community resources:  SBIRT  Patient/Family's Response to care:  Pt appreciative of CSW support.   Patient/Family's Understanding of and Emotional Response to Diagnosis, Current Treatment, and Prognosis:  Pt appears to understand current treatment and diagnosis. Pt disoriented to situation so unclear as to understanding of  prognosis.   Emotional Assessment Appearance:  Appears stated age Attitude/Demeanor/Rapport:  Lethargic Affect (typically observed):  Pleasant Orientation:  Oriented to Self, Oriented to Place, Oriented to  Time Alcohol / Substance use:  Other Psych involvement (Current and /or in the community):  No (Comment)  Discharge Needs  Concerns to be addressed:  Other (Comment Required (SBIRT) Readmission within the last 30 days:  No Current discharge risk:  Other (TBI) Barriers to Discharge:  Continued Medical Work up   [employer], LCSW 02/02/2017, 12:33 PM

## 2017-02-03 MED ORDER — OXYCODONE HCL 5 MG PO TABS: 5.0000 mg | ORAL_TABLET | ORAL | 0 refills | Status: DC | PRN

## 2017-02-03 MED ORDER — ONDANSETRON HCL 4 MG PO TABS: 4.0000 mg | ORAL_TABLET | Freq: Four times a day (QID) | ORAL | 0 refills | Status: DC | PRN

## 2017-02-03 MED ORDER — LEVETIRACETAM 500 MG PO TABS: 500.0000 mg | ORAL_TABLET | Freq: Two times a day (BID) | ORAL | 0 refills | Status: DC

## 2017-02-03 MED ORDER — TRAMADOL HCL 50 MG PO TABS: 50.0000 mg | ORAL_TABLET | Freq: Four times a day (QID) | ORAL | 0 refills | Status: DC

## 2017-02-03 NOTE — Progress Notes (Signed)
Occupational Therapy Treatment Patient Details Name: Lucas Williams MRN: [id number] DOB: 1976-02-18 Today's Date: 02/03/2017    History of present illness Patient is a 41 y/o male who was found down by police. Head CT-Multiple areas of parenchymal hematoma in the brain. Largest hematoma in the right frontal lobe measures 4 x 5 cm with surrounding edema and 6 mm midline shift to the left. Follow up CT-unchanged.   OT comments  Pt continues to perseverate on need to go to bed this session. He was able to complete UB dressing tasks with min guard assist and LB dressing tasks with min assist in preparation for D/C home with sister. He demonstrated impulsive behavior when putting on pants requiring VC's for safety. Pt with decreased attention when distractions increased in room due to multiple conversations. Educated pt's sister concerning need for increased structure during daily routine and decreased distractions in order to maximize success and participation with ADL. She verbalizes understanding. Additionally provided written education concerning Rancho level VI expectations. OT will continue to follow while admitted and continue to recommend outpatient OT services in order to maximize return to PLOF post-acute D/C.    Follow Up Recommendations  Supervision/Assistance - 24 hour;Outpatient OT    Equipment Recommendations  3 in 1 bedside commode    Recommendations for Other Services      Precautions / Restrictions Precautions Precautions: Fall Precaution Comments: Impulsive Restrictions Weight Bearing Restrictions: No       Mobility Bed Mobility Overal bed mobility: Modified Independent             General bed mobility comments: Impulsive  Transfers                 General transfer comment: Pt declined OOB this session.     Balance Overall balance assessment: Needs assistance Sitting-balance support: No upper extremity supported;Feet supported Sitting  balance-Leahy Scale: Good Sitting balance - Comments: pt able to sit EOB with supervision                                   ADL either performed or assessed with clinical judgement   ADL Overall ADL's : Needs assistance/impaired                 Upper Body Dressing : Min guard;Sitting   Lower Body Dressing: Minimal assistance;Sitting/lateral leans Lower Body Dressing Details (indicate cue type and reason): CNA assisting pt to thread feet and pt pulling pants up without physical assistance but impulsively and laying back on bed to pull over hips.               General ADL Comments: Provided education concerning decreasing distractions and structuring ADL routine to pt's sister. She reports understanding and is quick to share with me that she is a Engineer, civil (consulting). Provided written education and they state appreciation.      Vision   Additional Comments: Closes eyes due to headache. Able to use vision functionally this session when open.   Perception     Praxis      Cognition Arousal/Alertness: Awake/alert Behavior During Therapy: Impulsive;Restless Overall Cognitive Status: Impaired/Different from baseline Area of Impairment: Attention;Memory;Following commands;Safety/judgement;Problem solving               Rancho Levels of Cognitive Functioning Rancho Los Amigos Scales of Cognitive Functioning: Confused/appropriate Orientation Level: Disoriented to;Situation Current Attention Level: Selective Memory: Decreased short-term memory Following Commands: Follows one step commands  inconsistently;Follows one step commands with increased time;Follows multi-step commands inconsistently;Follows multi-step commands with increased time Safety/Judgement: Decreased awareness of safety;Decreased awareness of deficits Awareness: Intellectual Problem Solving: Slow processing;Decreased initiation;Difficulty sequencing;Requires verbal cues General Comments: Pt continues to  require maximum multimodal cues for attention and initiation to participate in ADL this session. Educated pt and his sister (caregiver) concerning need for decreased distractions and increasing structure so that pt able to participate in as much of daily routine as possible. Provided brain injury booklet with written information concerning Rancho level VI and ways to structure Lucas Williams's day to improve participation and promote recovery.         Exercises     Shoulder Instructions       General Comments      Pertinent Vitals/ Pain       Pain Assessment: Faces Faces Pain Scale: Hurts even more Pain Location: headache Pain Descriptors / Indicators: Headache Pain Intervention(s): Monitored during session  Home Living                                          Prior Functioning/Environment              Frequency  Min 3X/week        Progress Toward Goals  OT Goals(current goals can now be found in the care plan section)  Progress towards OT goals: Progressing toward goals  Acute Rehab OT Goals Patient Stated Goal: patient continues to perseverate on wanting to go to bed OT Goal Formulation: With patient Time For Goal Achievement: 02/14/17 Potential to Achieve Goals: Good ADL Goals Pt Will Perform Grooming: with modified independence;standing Pt Will Perform Upper Body Bathing: with modified independence;sitting;standing Pt Will Perform Lower Body Bathing: with modified independence;sit to/from stand Pt Will Perform Upper Body Dressing: with modified independence;sitting;standing Pt Will Perform Lower Body Dressing: with modified independence;sit to/from stand Pt Will Transfer to Toilet: with modified independence;ambulating;regular height toilet Pt Will Perform Toileting - Clothing Manipulation and hygiene: with modified independence;sit to/from stand Pt Will Perform Tub/Shower Transfer: Tub transfer;with modified independence;ambulating Additional ADL  Goal #1: Pt will self initiate am ADLs Additional ADL Goal #2: Pt will verbalize understanding of symptoms of TBI and recommendations Additional ADL Goal #3: Pt will perform path finding with min cues   Plan Discharge plan remains appropriate    Co-evaluation                 AM-PAC PT "6 Clicks" Daily Activity     Outcome Measure   Help from another person eating meals?: None Help from another person taking care of personal grooming?: A Little Help from another person toileting, which includes using toliet, bedpan, or urinal?: A Little Help from another person bathing (including washing, rinsing, drying)?: A Little Help from another person to put on and taking off regular upper body clothing?: A Little Help from another person to put on and taking off regular lower body clothing?: A Little 6 Click Score: 19    End of Session    OT Visit Diagnosis: Unsteadiness on feet (R26.81);Cognitive communication deficit (R41.841);Pain Pain - part of body:  (head)   Activity Tolerance Patient limited by pain   Patient Left in bed;with call bell/phone within reach;with bed alarm set;with nursing/sitter in room;with family/visitor present (tele sitter)   Nurse Communication Mobility status        Time: 8295-6213 OT Time  Calculation (min): 8 min  Charges: OT General Charges $OT Visit: 1 Procedure OT Treatments $Self Care/Home Management : 8-22 mins  Doristine Sectionharity A Tawan Degroote, MS OTR/L  Pager: [phone]    Loras Grieshop A Razia Screws 02/03/2017, 1:55 PM

## 2017-02-03 NOTE — Progress Notes (Signed)
Patient ID: Lucas Williams, male   DOB: [birth year], 41 y.o.   MRN: [id number] I spoke with his sister who he is going home with and answered her questions. She is a Engineer, civil (consulting)nurse and is comfortable caring for him. RN CM is speaking with her now as well. Violeta GelinasBurke Roberta Angell, MD, MPH, FACS Trauma: [phone] General Surgery: [phone]

## 2017-02-03 NOTE — Care Management Note (Signed)
Case Management Note  Patient Details  Name: Lucas Williams MRN: [id number] Date of Birth: Sep 19, 1976  Subjective/Objective:    Patient is a 41 y/o male who was found down by police. Head CT-Multiple areas of parenchymal hematoma in the brain; largest hematoma in the right frontal lobe measures 4 x 5 cm with surrounding edema and 6 mm midline shift to the left.  PTA, pt independent, lives alone.              Action/Plan: OT and ST recommending OP therapies; PT recommending no f/u.  Sister able to provide assistance at discharge, per therapy's notes.  Will follow progress.    Expected Discharge Date:  02/03/17               Expected Discharge Plan:  Home/Self Care  In-House Referral:     Discharge planning Services  CM Consult, MATCH Program, Follow-up appt scheduled, [facility]  Post Acute Care Choice:    Choice offered to:     DME Arranged:    DME Agency:     HH Arranged:    HH Agency:     Status of Service:  Completed, signed off  If discussed at [employer] Meetings, dates discussed:    Additional Comments:  02/03/17 J. Timesha Cervantez, RN, BSN  Pt medically stable for discharge home with sister today.  Sister at bedside; she is recovering from cervical CA and currently out of work; she is able to provide 24h supervision for pt.  Pt uninsured, but is eligible for medication assistance through [facility].  [facility] given with explanation of program benefits.  Pt has no PCP; [facility] follow up on 02/11/17 at 3pm.  Appointment information put on AVS in EPIC.  OT and ST recommending OP follow up therapies; referrals placed in EPIC with note to call sister for appt.  Reviewed all information with sister.  She is appreciative of all help given.    Quintella BatonJulie W. Laquanna Veazey, RN, BSN  Trauma/Neuro ICU Case Manager [phone]

## 2017-02-03 NOTE — Discharge Summary (Signed)
[facility] Discharge Summary   Patient ID: Lucas Williams MRN: [id number] DOB/AGE: June 07, 1976 41 y.o.  Admit date: 01/29/2017 Discharge date: 02/03/2017  Admitting Diagnosis: TBI SDH ICH  Discharge Diagnosis Patient Active Problem List   Diagnosis Date Noted  . TBI (traumatic brain injury) (HCC) 01/29/2017    Consultants Dr. Bevely Williams, Neurosurgery  Imaging: No results found.  Procedures None  HPI: Pt is a 41 yo M who was found down by police.  He was disoriented and had bruising around eyes.  He denies nausea or vomiting.  He is not really clear about what happened. Pt admitted to drinking ETOH last night. He was able to follow some commands in the ED. Workup showed ICH and SDH bilaterally, abrasion of left forehead, bruising around right eye, and laceration to left hand. Neurosurgery was consulted for TBI and recommended neuro exams q 1 hour, repeat head CT 12 hours, and Keppra for seizure prophylaxis.   Hospital Course:  Pt was admitted to the ICU under the care of the trauma team. Pt remained stable without focal deficits. TBI therapies were initiated. Pt was placed on CIWA precautions. On 5/14 the pt was transferred to the floor. On hospital day #5, the patient was voiding well, tolerating diet, ambulating well, pain well controlled, vital signs stable, and felt stable for discharge home with his sister who states she will care for the patient.  Patient will follow up in our office as needed and with Neurosurgery in 3-4 weeks and knows to call with questions or concerns.    Patient was discharged in good condition.  The [place] Substance controlled database was reviewed prior to prescribing narcotic pain medication to this patient.  Physical Exam: General:  Alert, NAD, pleasant, cooperative, well appearing HEENT: pupils are equal, conjunctiva normal bilaterally Cardio: RRR, S1 & S2 normal, no murmur, rubs, gallops Resp: Effort normal, lungs  CTA bilaterally, no wheezes, rales, rhonchi Skin: warm and dry Extremities: 5/5 plantar flexion of BLE, sensation intact bilaterally, calves are soft and nontender bilaterally, 2+ PT pulses bilaterally.  Neuro: cranial nerves grossly intact, no sensory deficit noted Psych: A&Ox3, appropriate mood  Allergies as of 02/03/2017   No Known Allergies     Medication List    STOP taking these medications   oxyCODONE-acetaminophen 5-325 MG tablet Commonly known as:  PERCOCET     TAKE these medications   levETIRAcetam 500 MG tablet Commonly known as:  KEPPRA Take 1 tablet (500 mg total) by mouth 2 (two) times daily.   omeprazole 20 MG capsule Commonly known as:  PRILOSEC Take 1 capsule (20 mg total) by mouth daily.   ondansetron 4 MG tablet Commonly known as:  ZOFRAN Take 1 tablet (4 mg total) by mouth every 6 (six) hours as needed for nausea.   oxyCODONE 5 MG immediate release tablet Commonly known as:  Oxy IR/ROXICODONE Take 1 tablet (5 mg total) by mouth every 4 (four) hours as needed (5mg  for mild pain, 10mg  for moderate pain, 15mg  for severe pain).   traMADol 50 MG tablet Commonly known as:  ULTRAM Take 1 tablet (50 mg total) by mouth every 6 (six) hours.        Follow-up Information    Ditty, Loura HaltBenjamin Jared, MD Follow up in 4 week(s).   Specialty:  Neurosurgery Why:  schedule a follow up appointment in 3-4 weeks from discharge Contact information: [address] [place] [address] [phone]        [facility] [place]. Call.   Why:  as needed Contact information: [address] [place] [address] [phone]          Signed: Joyce Copa [facility] 02/03/2017, 8:00 AM Pager: [phone] Consults: [phone] Mon-Fri 7:00 am-4:30 pm Sat-Sun 7:00 am-11:30 am

## 2017-02-03 NOTE — Progress Notes (Signed)
Physical Therapy Treatment Patient Details Name: Lucas Williams MRN: [id number] DOB: [birth year] Today's Date: 02/03/2017    History of Present Illness Patient is a 41 y/o male who was found down by police. Head CT-Multiple areas of parenchymal hematoma in the brain. Largest hematoma in the right frontal lobe measures 4 x 5 cm with surrounding edema and 6 mm midline shift to the left. Follow up CT-unchanged.    PT Comments    Pt's sister present throughout session. Pt required max encouragement for participation; however, only agreeable to perform bed mobility, sit EOB and don boxers. Pt required constant cueing and redirection as well. Pt's RN was present during session and gave pt meds during. PT will continue to follow pt acutely to ensure a safe d/c home.    Follow Up Recommendations  No PT follow up;Supervision/Assistance - 24 hour     Equipment Recommendations  None recommended by PT    Recommendations for Other Services       Precautions / Restrictions Precautions Precautions: Fall Restrictions Weight Bearing Restrictions: No    Mobility  Bed Mobility Overal bed mobility: Modified Independent             General bed mobility comments: Increased time and encouragement to attend to and initiate task.  Transfers                 General transfer comment: pt refusing to participate in further therapuetic interventions at this time  Ambulation/Gait                 Stairs            Wheelchair Mobility    Modified Rankin (Stroke Patients Only)       Balance Overall balance assessment: Needs assistance Sitting-balance support: No upper extremity supported;Feet supported Sitting balance-Leahy Scale: Good Sitting balance - Comments: pt able to sit EOB with supervision                                    Cognition Arousal/Alertness: Awake/alert Behavior During Therapy: Impulsive;Restless Overall Cognitive Status:  Impaired/Different from baseline Area of Impairment: Attention;Memory;Following commands;Safety/judgement;Problem solving                   Current Attention Level: Selective Memory: Decreased short-term memory Following Commands: Follows one step commands inconsistently;Follows one step commands with increased time;Follows multi-step commands inconsistently;Follows multi-step commands with increased time Safety/Judgement: Decreased awareness of safety;Decreased awareness of deficits Awareness: Intellectual Problem Solving: Slow processing;Decreased initiation;Difficulty sequencing;Requires verbal cues        Exercises      General Comments        Pertinent Vitals/Pain Pain Assessment: Faces Faces Pain Scale: Hurts a little bit Pain Location: headache Pain Descriptors / Indicators: Headache Pain Intervention(s): Monitored during session;RN gave pain meds during session    Home Living                      Prior Function            PT Goals (current goals can now be found in the care plan section) Acute Rehab PT Goals PT Goal Formulation: With patient Time For Goal Achievement: 02/13/17 Potential to Achieve Goals: Good Progress towards PT goals: Progressing toward goals    Frequency    Min 4X/week      PT Plan Current plan remains appropriate    Co-evaluation  AM-PAC PT "6 Clicks" Daily Activity  Outcome Measure  Difficulty turning over in bed (including adjusting bedclothes, sheets and blankets)?: None Difficulty moving from lying on back to sitting on the side of the bed? : None Difficulty sitting down on and standing up from a chair with arms (e.g., wheelchair, bedside commode, etc,.)?: None Help needed moving to and from a bed to chair (including a wheelchair)?: A Little Help needed walking in hospital room?: A Little Help needed climbing 3-5 steps with a railing? : A Little 6 Click Score: 21    End of Session    Activity Tolerance: Patient limited by fatigue Patient left: in bed;with call bell/phone within reach;with bed alarm set;with family/visitor present Nurse Communication: Mobility status PT Visit Diagnosis: Other symptoms and signs involving the nervous system (R29.898);Other abnormalities of gait and mobility (R26.89)     Time: 9147-8295 PT Time Calculation (min) (ACUTE ONLY): 13 min  Charges:  $Therapeutic Activity: 8-22 mins                    G Codes:       [place], [place], [place] [phone]    Alessandra Bevels Sisto Granillo 02/03/2017, 10:41 AM

## 2017-02-03 NOTE — Discharge Instructions (Signed)
Head Injury, Adult There are many types of head injuries. Head injuries can be as minor as a bump, or they can be more severe. More severe head injuries include:  A jarring injury to the brain (concussion).  A bruise of the brain (contusion). This means there is bleeding in the brain that can cause swelling.  A cracked skull (skull fracture).  Bleeding in the brain that collects, clots, and forms a bump (hematoma). After a head injury, you may need to be observed for a while in the emergency department or urgent care. Sometimes admission to the hospital is needed. After a head injury has happened, most problems occur within the first 24 hours, but side effects may occur up to 7-10 days after the injury. It is important to watch your condition for any changes. What are the causes? There are many possible causes of a head injury. A serious head injury may happen to someone who is in a car accident (motor vehicle collision). Other causes of major head injuries include bicycle or motorcycle accidents, sports injuries, and falls. Risk factors This condition is more likely to occur in people who:  Drink a lot of alcohol or use drugs.  Are over the age of 45.  Are at risk for falls. What are the symptoms? There are many possible symptoms of a head injury. Visible symptoms of a head injury include a bruise, bump, or bleeding at the site of the injury. Other non-visible symptoms include:  Feeling sleepy or not being able to stay awake.  Passing out.  Headache.  Seizures.  Dizziness.  Confusion.  Memory problems.  Nausea or vomiting. Other possible symptoms that may develop after the head injury include:  Poor attention and concentration.  Fatigue or tiring easily.  Irritability.  Being uncomfortable around bright lights or loud noises.  Anxiety or depression.  Disturbed sleep. How is this diagnosed? This condition can usually be diagnosed based on your symptoms, a  description of the injury, and a physical exam. You may also have imaging tests done, such as a CT scan or MRI. You will also be closely watched. How is this treated? Treatment for this condition depends on the severity and type of injury you have. The main goal of treatment is to prevent complications and allow the brain time to heal. For mild head injury, you may be sent home and treatment may include:  Observation. A responsible adult should stay with you for 24 hours after your injury and check on you often.  Physical rest.  Brain rest.  Pain medicines. For severe brain injury, treatment may include:  Close observation. This includes hospitalization with frequent physical exams. You may need to go to a hospital that specializes in head injury.  Pain medicines.  Breathing support. This may include using a ventilator.  Managing the pressure inside the brain (intracranial pressure, or ICP). This may include:  Monitoring the ICP.  Giving medicines to decrease the ICP.  Positioning you to decrease the ICP.  Medicine to prevent seizures.  Surgery to stop bleeding or to remove blood clots (craniotomy).  Surgery to remove part of the skull (decompressive craniectomy). This allows room for the brain to swell. Follow these instructions at home: Activity  Rest as much as possible and avoid activities that are physically hard or tiring.  Make sure you get enough sleep.  Limit activities that require a lot of thought or attention, such as:  Watching TV.  Playing memory games and puzzles.  Job-related  work or homework.  Working on [employer], and texting.  Avoid activities that could cause another head injury, such as playing sports, until your health care provider approves. Having another head injury, especially before the first one has healed, can be dangerous.  Ask your health care provider when it is safe for you to return to your regular activities,  including work or school. Ask your health care provider for a step-by-step plan for gradually returning to activities.  Ask your health care provider when you can drive, ride a bicycle, or use heavy machinery. Your ability to react may be slower after a brain injury. Never do these activities if you are dizzy. Lifestyle  Do not drink alcohol until your health care provider approves, and avoid drug use. Alcohol and certain drugs may slow your recovery and can put you at risk of further injury.  If it is harder than usual to remember things, write them down.  If you are easily distracted, try to do one thing at a time.  Talk with family members or close friends when making important decisions.  Tell your friends, family, a trusted colleague, and work Production designer, theatre/television/film about your injury, symptoms, and restrictions. Have them watch for any new or worsening problems. General instructions  Take over-the-counter and prescription medicines only as told by your health care provider.  Have someone stay with you for 24 hours after your head injury. This person should watch you for any changes in your symptoms and be ready to seek medical help, as needed.  Keep all follow-up visits as told by your health care provider. This is important. Prevention  Work on improving your balance and strength to avoid falls.  Wear a seatbelt when you are in a moving vehicle.  Wear a helmet when riding a bicycle, skiing, or doing any other sport or activity that has a risk of injury.  Drink alcohol only in moderation.  Take safety measures in your home, such as:  Removing clutter and tripping hazards from floors and stairways.  Using grab bars in bathrooms and handrails by stairs.  Placing non-slip mats on floors and in bathtubs.  Improving lighting in dim areas. Get help right away if:  You have:  A severe headache that is not helped by medicine.  Trouble walking, have weakness in your arms and legs, or lose  your balance.  Clear or bloody fluid coming from your nose or ears.  Changes in your vision.  A seizure.  You vomit.  Your symptoms get worse.  Your speech is slurred.  You pass out.  You are sleepier and have trouble staying awake.  Your pupils change size. These symptoms may represent a serious problem that is an emergency. Do not wait to see if the symptoms will go away. Get medical help right away. Call your local emergency services (911 in the [place] Do not drive yourself to the hospital. This information is not intended to replace advice given to you by your health care provider. Make sure you discuss any questions you have with your health care provider. Document Released: 09/06/2005 Document Revised: 04/02/2016 Document Reviewed: 03/16/2016 Elsevier Interactive Patient Education  2017 [employer] Inc[employer]   Contusion A contusion is a deep bruise. Contusions are the result of a blunt injury to tissues and muscle fibers under the skin. The injury causes bleeding under the skin. The skin overlying the contusion may turn blue, purple, or yellow. Minor injuries will give you a painless contusion, but more  severe contusions may stay painful and swollen for a few weeks. What are the causes? This condition is usually caused by a blow, trauma, or direct force to an area of the body. What are the signs or symptoms? Symptoms of this condition include:  Swelling of the injured area.  Pain and tenderness in the injured area.  Discoloration. The area may have redness and then turn blue, purple, or yellow. How is this diagnosed? This condition is diagnosed based on a physical exam and medical history. An X-ray, CT scan, or MRI may be needed to determine if there are any associated injuries, such as broken bones (fractures). How is this treated? Specific treatment for this condition depends on what area of the body was injured. In general, the best treatment for a contusion is resting,  icing, applying pressure to (compression), and elevating the injured area. This is often called the RICE strategy. Over-the-counter anti-inflammatory medicines may also be recommended for pain control. Follow these instructions at home:  Rest the injured area.  If directed, apply ice to the injured area:  Put ice in a plastic bag.  Place a towel between your skin and the bag.  Leave the ice on for 20 minutes, 2-3 times per day.  If directed, apply light compression to the injured area using an elastic bandage. Make sure the bandage is not wrapped too tightly. Remove and reapply the bandage as directed by your health care provider.  If possible, raise (elevate) the injured area above the level of your heart while you are sitting or lying down.  Take over-the-counter and prescription medicines only as told by your health care provider. Contact a health care provider if:  Your symptoms do not improve after several days of treatment.  Your symptoms get worse.  You have difficulty moving the injured area. Get help right away if:  You have severe pain.  You have numbness in a hand or foot.  Your hand or foot turns pale or cold. This information is not intended to replace advice given to you by your health care provider. Make sure you discuss any questions you have with your health care provider. Document Released: 06/16/2005 Document Revised: 01/15/2016 Document Reviewed: 01/22/2015 Elsevier Interactive Patient Education  2017 [employer]

## 2017-02-11 ENCOUNTER — Encounter: Payer: Self-pay | Admitting: Internal Medicine

## 2017-02-11 ENCOUNTER — Ambulatory Visit: Payer: Self-pay | Attending: Internal Medicine | Admitting: Internal Medicine

## 2017-02-11 ENCOUNTER — Ambulatory Visit: Payer: MEDICAID | Attending: Internal Medicine | Admitting: Licensed Clinical Social Worker

## 2017-02-11 VITALS — BP 118/78 | HR 110 | Temp 97.8°F | Resp 20 | Ht 71.5 in | Wt 212.6 lb

## 2017-02-11 DIAGNOSIS — S069X9S Unspecified intracranial injury with loss of consciousness of unspecified duration, sequela: Secondary | ICD-10-CM

## 2017-02-11 DIAGNOSIS — S065X9S Traumatic subdural hemorrhage with loss of consciousness of unspecified duration, sequela: Secondary | ICD-10-CM | POA: Insufficient documentation

## 2017-02-11 DIAGNOSIS — Z79899 Other long term (current) drug therapy: Secondary | ICD-10-CM | POA: Insufficient documentation

## 2017-02-11 DIAGNOSIS — X509XXA Other and unspecified overexertion or strenuous movements or postures, initial encounter: Secondary | ICD-10-CM | POA: Insufficient documentation

## 2017-02-11 DIAGNOSIS — F4323 Adjustment disorder with mixed anxiety and depressed mood: Secondary | ICD-10-CM

## 2017-02-11 DIAGNOSIS — F05 Delirium due to known physiological condition: Secondary | ICD-10-CM | POA: Insufficient documentation

## 2017-02-11 DIAGNOSIS — F17210 Nicotine dependence, cigarettes, uncomplicated: Secondary | ICD-10-CM | POA: Insufficient documentation

## 2017-02-11 DIAGNOSIS — M79651 Pain in right thigh: Secondary | ICD-10-CM | POA: Insufficient documentation

## 2017-02-11 DIAGNOSIS — T148XXA Other injury of unspecified body region, initial encounter: Secondary | ICD-10-CM | POA: Insufficient documentation

## 2017-02-11 DIAGNOSIS — R441 Visual hallucinations: Secondary | ICD-10-CM | POA: Insufficient documentation

## 2017-02-11 DIAGNOSIS — F1010 Alcohol abuse, uncomplicated: Secondary | ICD-10-CM | POA: Insufficient documentation

## 2017-02-11 DIAGNOSIS — R51 Headache: Secondary | ICD-10-CM | POA: Insufficient documentation

## 2017-02-11 MED ORDER — DICLOFENAC SODIUM 1 % TD GEL: 2.0000 g | Freq: Four times a day (QID) | TRANSDERMAL | 1 refills | Status: DC

## 2017-02-11 MED ORDER — TRAZODONE HCL 50 MG PO TABS: 25.0000 mg | ORAL_TABLET | Freq: Every evening | ORAL | 0 refills | Status: DC | PRN

## 2017-02-11 MED FILL — traZODone HCL 50 MG TABS: 50 | 30 days supply | Qty: 15 | Fill #0

## 2017-02-11 MED FILL — VOLTAREN 1% GEL: 1 | 13 days supply | Qty: 100 | Fill #0

## 2017-02-11 NOTE — Progress Notes (Signed)
Patient ID: Lucas Williams, male    DOB: 12-03-1975  MRN: [id number]  CC: Establish Care (Brain Injury)   Subjective: Lucas Williams is a 41 y.o. male who presents for new pt visit/hosp f/u.  His sister is with him. His concerns today include:  Pt hosp at [facility] 5/12-17/2018 with TBI.  I have copied below pertinent parts of his dischg summary: Pt is a 41 yo M who was found down by police. He was disoriented and had bruising around eyes. He denies nausea or vomiting. He is not really clear about what happened. Pt admitted to drinking ETOH last night.He was able to follow some commands in the ED. Workup showed ICH and SDH bilaterally, abrasion of left forehead, bruising around right eye, and laceration to left hand. Neurosurgery was consulted for TBI and recommended neuro exams q 1 hour, repeat head CT 12 hours, and Keppra for seizure prophylaxis.  Hospital Course:  Pt was admitted to the ICU under the care of the trauma team. Pt remained stable without focal deficits. TBI therapies were initiated. Pt was placed on CIWA precautions. On 5/14 the pt was transferred to the floor. On hospital day #5, the patient was voiding well, tolerating diet, ambulating well, pain well controlled, vital signs stable, and felt stable for discharge home with his sister who states she will care for the patient.  Patient will follow up in our office as needed and with Neurosurgery in 3-4 weeks and knows to call with questions or concerns.    Patient was discharged in good condition.  The [place] Substance controlled database was reviewed prior to prescribing narcotic pain medication to this patient.  Todays out-pt Visit: 1.  Pt reports memory problems since injury -fine motor skills not at base line.  Past 2 days, he was able to feed himself for the first time without assistance from sister.  -gait initially unsteady but sister states he is better and needs stand by assist.   -appetite is getting  better -gets agitiated and confused at nights.  Gets up and goes outside in underwear.  +Auditory and visual hallucinations. Someone stays with him through the night to prevent him from leaving the house -he was referred for OT and speech.  Has not started as yet -Discharged with 6 Keppra which he has completed 4 days ago.. Sister is not sure whether he was suppose to continue taking. He has not had any seizures.  2.  Headache: Tramadol works better.  The Oxycodone makes him loopy.  Sister has been giving the Tramadol Q 6 hrs PRN  3.  Pt admits to ETOH abuse. Drank 8-10 beers and whiskey QOD prior to this.  No street drugs.  -worked as a Production designer, theatre/television/film at [employer] -He is anxious to return to work -He feels down and anxious but attributes this to his current health issue and the fact that he is anxious to return to work  4.  Pain in RT posterior thigh.  Occurred when he twisted wrong at work a day before his head injury.  Has a pulling pain in hamstring and upper calf  Patient Active Problem List   Diagnosis Date Noted  . TBI (traumatic brain injury) (HCC) 01/29/2017     Current Outpatient Prescriptions on File Prior to Visit  Medication Sig Dispense Refill  . omeprazole (PRILOSEC) 20 MG capsule Take 1 capsule (20 mg total) by mouth daily. 30 capsule 0  . ondansetron (ZOFRAN) 4 MG tablet Take 1 tablet (  4 mg total) by mouth every 6 (six) hours as needed for nausea. 20 tablet 0  . oxyCODONE (OXY IR/ROXICODONE) 5 MG immediate release tablet Take 1 tablet (5 mg total) by mouth every 4 (four) hours as needed (5mg  for mild pain, 10mg  for moderate pain, 15mg  for severe pain). 20 tablet 0  . traMADol (ULTRAM) 50 MG tablet Take 1 tablet (50 mg total) by mouth every 6 (six) hours. 40 tablet 0   No current facility-administered medications on file prior to visit.     No Known Allergies  Social History   Social History  . Marital status: Unknown    Spouse name: N/A  . Number of children:  N/A  . Years of education: N/A   Occupational History  . Not on file.   Social History Main Topics  . Smoking status: Current Every Day Smoker    Packs/day: 0.50  . Smokeless tobacco: Current User  . Alcohol use Yes     Comment: DAILY  . Drug use: No  . Sexual activity: Not Currently   Other Topics Concern  . Not on file   Social History Narrative  . No narrative on file    No family history on file.  Past Surgical History:  Procedure Laterality Date  . FINGER SURGERY     r/t staph infection I&D, hand lac cutting trees  . HERNIA REPAIR     LT ihr  . NERVE REPAIR Left 12/11/2012   Procedure: Left Hand Exploration Wound with Nerve Repair;  Surgeon: Tami Ribas, MD;  Location: [facility];  Service: Orthopedics;  Laterality: Left;    ROS: Review of Systems  Respiratory: Negative for chest tightness and shortness of breath.   Cardiovascular: Negative for chest pain.  Neurological: Positive for headaches. Negative for seizures.  Psychiatric/Behavioral: Positive for dysphoric mood. The patient is nervous/anxious.    -as stated above  PHYSICAL EXAM: BP 118/78 (BP Location: Right Arm, Patient Position: Sitting, Cuff Size: Large)   Pulse (!) 110   Temp 97.8 F (36.6 C) (Oral)   Resp 20   Ht 5' 11.5" (1.816 m)   Wt 212 lb 9.6 oz (96.4 kg)   SpO2 98%   BMI 29.24 kg/m   Physical Exam General appearance - alert, well appearing, middle age caucasian male and in no distress Mental status - oriented to person, place Eyes - pupils equal and reactive, extraocular eye movements intact Mouth - mucous membranes moist, pharynx normal without lesions Neck - supple, no significant adenopathy Chest - clear to auscultation, no wheezes, rales or rhonchi, symmetric air entry Heart - normal rate, regular rhythm, normal S1, S2, no murmurs, rubs, clicks or gallops Neurological - cranial nerves II through XII intact, motor and sensory grossly normal bilaterally, normal  muscle tone, no tremors, strength 5/5 Musculoskeletal -RT leg - mild tenderness along lower hamstring.  Good ROM RT knee Extremities - no LE edema  Depression screen [facility] 2/9 02/11/2017  Decreased Interest 3  Down, Depressed, Hopeless 3  PHQ - 2 Score 6  Altered sleeping 3  Tired, decreased energy 3  Change in appetite 3  Feeling bad or failure about yourself  1  Trouble concentrating 3  Moving slowly or fidgety/restless 3  Suicidal thoughts 1  PHQ-9 Score 23   GAD 7 : Generalized Anxiety Score 02/11/2017  Nervous, Anxious, on Edge 3  Control/stop worrying 2  Worry too much - different things 2  Trouble relaxing 1  Restless 2  Easily annoyed or irritable 3  Afraid - awful might happen 1  Total GAD 7 Score 14  Anxiety Difficulty Extremely difficult    ASSESSMENT AND PLAN: 1. Traumatic brain injury with loss of consciousness, sequela (HCC) -Clinically improving. -His sister is working on getting the appointment for his OT and speech therapy -He is having some sundowning which may be related to the TBI plus minus EtOH -I have prescribed a low dose of trazodone for him to take at night to see if this will help decrease the restlessness. Patient informed of possible side effect of  Priapism -Keep follow-up appointment with neurosurgeon. Once cleared by the neurosurgeon and OT he can return to work. - traZODone (DESYREL) 50 MG tablet; Take 0.5 tablets (25 mg total) by mouth at bedtime as needed for sleep.  Dispense: 30 tablet; Refill: 0  2. Pulled muscle -Recommend warm compresses or heating pad. Diclofenac gel to use 2-4 times daily. - diclofenac sodium (VOLTAREN) 1 % GEL; Apply 2 g topically 4 (four) times daily.  Dispense: 100 g; Refill: 1  3. Alcohol abuse -Encourage complete abstinence and he states he intends to do so except for a beer every once in a while Encourage him to join AA or some other support group LCSW to see him today -He will follow-up with me in 6-7 weeks.   We can consider naltrexone at that time if he is having cravings 4. Adjustment reaction with anxiety and depression Clinical social worker to see him today - traZODone (DESYREL) 50 MG tablet; Take 0.5 tablets (25 mg total) by mouth at bedtime as needed for sleep.  Dispense: 30 tablet; Refill: 0  5. Sundowning -See plan above  Patient was given the opportunity to ask questions.  Patient verbalized understanding of the plan and was able to repeat key elements of the plan.   No orders of the defined types were placed in this encounter.    Requested Prescriptions   Signed Prescriptions Disp Refills  . diclofenac sodium (VOLTAREN) 1 % GEL 100 g 1    Sig: Apply 2 g topically 4 (four) times daily.  . traZODone (DESYREL) 50 MG tablet 30 tablet 0    Sig: Take 0.5 tablets (25 mg total) by mouth at bedtime as needed for sleep.    Return in about 7 weeks (around 04/01/2017).  Jonah Blueeborah Johnson, MD, FACP

## 2017-02-11 NOTE — Patient Instructions (Signed)
Use the Diclofenac gel on Right thigh.  Use a heating pad as needed.   Keep follow up appointment with neurosurgeon.   You should abstain from alcohol completely.  On our next visit, we can discuss trying you with Naltrexon if you still have cravings for alcohol.   Use the Trazodone at bedtime as needed.

## 2017-02-11 NOTE — BH Specialist Note (Signed)
Integrated Behavioral Health Initial Visit  MRN: [id number] Name: Lucas Williams   Session Start time: 4:00 PM Session End time: 4:15 PM Total time: 15 minutes  Type of Service: Integrated Behavioral Health- Individual/Family Interpretor:No. Interpretor Name and Language: N/A   Warm Hand Off Completed.       SUBJECTIVE: Lucas Williams is a 41 y.o. male accompanied by sister. Patient was referred by Dr. Laural BenesJohnson for depression and substance use. Patient reports the following symptoms/concerns: overwhelming feelings of sadness and worry, difficulty sleeping, low energy, difficulty focusing, confusion, restlessness, irritability, and suicidal ideations Duration of problem: 2 weeks; Severity of problem: moderate  OBJECTIVE: Mood: Anxious and Depressed and Affect: Appropriate Risk of harm to self or others: Suicidal ideation No plan to harm self or others   LIFE CONTEXT: Family and Social: Pt is residing with older sister and nephew. He receives strong support from family and friends School/Work: Pt is employed as a Education officer, environmentalrestaurant manager Self-Care: Family reports alcohol dependence noting drinking approx eight beers daily. Pt has been sober from alcohol since hospitalization  Life Changes: Pt recently hospitalized for TBI. Family reports last year has been difficult due to grieving the loss of multiple family members   GOALS ADDRESSED: Patient will reduce symptoms of: anxiety and depression and increase knowledge and/or ability of: coping skills and healthy habits and also: Increase adequate support systems for patient/family and Decrease self-medicating behaviors   INTERVENTIONS: Supportive Counseling, Psychoeducation and/or Health Education and Link to [facility]  Standardized Assessments completed: PHQ 2&9  ASSESSMENT: Patient currently experiencing depression and anxiety. Patient reports the following symptoms are related to recent hospitalization from a TBI:  overwhelming feelings of sadness and worry, difficulty sleeping, low energy, difficulty focusing, confusion, restlessness, irritability, and suicidal ideations. Pt receives strong support from family and friends, noting older sister was present during visit. Patient may benefit from medication management and psychotherapy. Family shared pt's hx of substance use (alcohol) LCSWA educated pt on the cycle of depression and anxiety and discussed how substance use can negatively impact pt's mental and physical health. Pt was encouraged to identify healthy coping strategy to utilize to decrease symptoms. Pt is not interested in AA or psychotherapy services at present time. LCSWA provided pt with community resources for crisis intervention, substance use, and medication management.  PLAN: 1. Follow up with behavioral health clinician on : Pt was encouraged to contact [facility]CSWA if symptoms worsen or fail to improve to [facility]. 2. Behavioral recommendations: LCSWA recommends that pt apply healthy coping skills discussed and utilize provided resources. Pt is encouraged to schedule follow up appointment with LCSWA 3. Referral(s): [facility] Mental [facility] Services (LME/Outside Clinic) and Substance Abuse Program 4. "From scale of 1-10, how likely are you to follow plan?": 5/10  Bridgett LarssonJasmine D Havier Deeb, LCSW 02/15/17 12:08 PM

## 2017-03-25 ENCOUNTER — Encounter: Payer: Self-pay | Admitting: Internal Medicine

## 2017-03-25 ENCOUNTER — Ambulatory Visit: Payer: Self-pay | Attending: Internal Medicine | Admitting: Internal Medicine

## 2017-03-25 VITALS — BP 119/89 | HR 94 | Temp 97.9°F | Resp 16 | Wt 210.6 lb

## 2017-03-25 DIAGNOSIS — S069X9D Unspecified intracranial injury with loss of consciousness of unspecified duration, subsequent encounter: Secondary | ICD-10-CM | POA: Insufficient documentation

## 2017-03-25 DIAGNOSIS — F1011 Alcohol abuse, in remission: Secondary | ICD-10-CM | POA: Insufficient documentation

## 2017-03-25 DIAGNOSIS — T148XXA Other injury of unspecified body region, initial encounter: Secondary | ICD-10-CM

## 2017-03-25 DIAGNOSIS — F4323 Adjustment disorder with mixed anxiety and depressed mood: Secondary | ICD-10-CM | POA: Insufficient documentation

## 2017-03-25 DIAGNOSIS — R51 Headache: Secondary | ICD-10-CM | POA: Insufficient documentation

## 2017-03-25 DIAGNOSIS — X58XXXD Exposure to other specified factors, subsequent encounter: Secondary | ICD-10-CM | POA: Insufficient documentation

## 2017-03-25 MED ORDER — CYCLOBENZAPRINE HCL 5 MG PO TABS: 5.0000 mg | ORAL_TABLET | Freq: Two times a day (BID) | ORAL | 0 refills | Status: DC | PRN

## 2017-03-25 MED FILL — CYCLOBENZAPRINE 5 MG TABLET: 5 | 15 days supply | Qty: 30 | Fill #0

## 2017-03-25 NOTE — Patient Instructions (Signed)
Use the Flexeril as needed for muscle spasm. Keep follow up appointment with the neurosurgeon.

## 2017-03-25 NOTE — Progress Notes (Signed)
Patient ID: Lucas Williams, male    DOB: 1975/10/26  MRN: [id number]  CC:  F/u TBI,   Subjective:  Lucas Williams is a 41 y.o. male who presents for f/u visit.  His sister, Lucas Williams, is with him. His concerns today include:  Pt with hx of TBI, ETOH abuse, adjustment reaction with anxiety and depression.  Doing better but still gets occipital HA -Memory is almost back to normal. His coordination and gait are back to baseline. He is independent in his ADLs . He never did get home OT. Has follow-up appointment with the neurosurgeon next week and is hoping to be released to return to work -sleeping 10-12 hrs without using Trazodone and Tramadol. Takes tramadol occasionally for the headache -does not snore  -Requesting a note for work just to let his employer know that he is still on medical leave  2. Right hamstring better but still gets some spasms in the muscle.Voltaren gel helped a lot. requesting a low dose of a muscle relaxant    3. ETOH: Drank only 1 beer since I last saw him. He plans to remain free of alcohol  Patient Active Problem List   Diagnosis Date Noted  . Alcohol abuse 02/11/2017  . 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 Pulled muscle 02/11/2017  . TBI (traumatic brain injury) (HCC) 01/29/2017     Current Outpatient Prescriptions on File Prior to Visit  Medication Sig Dispense Refill  . diclofenac sodium (VOLTAREN) 1 % GEL Apply 2 g topically 4 (four) times daily. 100 g 1  . omeprazole (PRILOSEC) 20 MG capsule Take 1 capsule (20 mg total) by mouth daily. 30 capsule 0  . ondansetron (ZOFRAN) 4 MG tablet Take 1 tablet (4 mg total) by mouth every 6 (six) hours as needed for nausea. 20 tablet 0  . oxyCODONE (OXY IR/ROXICODONE) 5 MG immediate release tablet Take 1 tablet (5 mg total) by mouth every 4 (four) hours as needed (5mg  for mild pain, 10mg  for moderate pain, 15mg  for severe pain). 20 tablet 0  . traMADol (ULTRAM) 50 MG tablet Take 1 tablet (50  mg total) by mouth every 6 (six) hours. 40 tablet 0  . traZODone (DESYREL) 50 MG tablet Take 0.5 tablets (25 mg total) by mouth at bedtime as needed for sleep. 30 tablet 0   No current facility-administered medications on file prior to visit.     No Known Allergies   ROS: Review of Systems As stated above  PHYSICAL EXAM: BP 119/89   Pulse 94   Temp 97.9 F (36.6 C) (Oral)   Resp 16   Wt 210 lb 9.6 oz (95.5 kg)   SpO2 97%   BMI 28.96 kg/m   Physical Exam General appearance - alert, well appearing, middle age caucasian male and in no distress Mental status - oriented to person, place Neurological - cranial nerves II through XII intact, motor and sensory grossly normal bilaterally, normal muscle tone, no tremors, strength 5/5 UE and LEs.  Gait normal Extremities - no LE edema  Depression screen [facility] 2/9 03/25/2017 02/11/2017  Decreased Interest 2 3  Down, Depressed, Hopeless 0 3  PHQ - 2 Score 2 6  Altered sleeping 3 3  Tired, decreased energy 3 3  Change in appetite 0 3  Feeling bad or failure about yourself  0 1  Trouble concentrating 1 3  Moving slowly or fidgety/restless 0 3  Suicidal thoughts 0 1  PHQ-9 Score 9 23   GAD 7 :  Generalized Anxiety Score 03/25/2017 02/11/2017  Nervous, Anxious, on Edge 0 3  Control/stop worrying 0 2  Worry too much - different things 0 2  Trouble relaxing 0 1  Restless 0 2  Easily annoyed or irritable 0 3  Afraid - awful might happen 0 1  Total GAD 7 Score 0 14  Anxiety Difficulty - Extremely difficult    ASSESSMENT AND PLAN: 1. Traumatic brain injury with loss of consciousness, subsequent encounter -Patient has made remarkable recovery physically and mentally. -He will keep his follow-up appointment with the neurosurgeon and hopefully can be released to return to work. 2. Alcohol abuse, in remission -Encourage him to abstain from alcohol  3. Pulled muscle -Flexeril when necessary. He should inform that medication can cause  drowsiness  Patient was given the opportunity to ask questions.  Patient verbalized understanding of the plan and was able to repeat key elements of the plan.   No orders of the defined types were placed in this encounter.    Requested Prescriptions   Signed Prescriptions Disp Refills  . cyclobenzaprine (FLEXERIL) 5 MG tablet 30 tablet 0    Sig: Take 1 tablet (5 mg total) by mouth 2 (two) times daily as needed for muscle spasms.    No future appointments.  Jonah Blueeborah Uno Esau, MD, FACP

## 2017-07-28 ENCOUNTER — Encounter (HOSPITAL_COMMUNITY): Payer: Self-pay | Admitting: *Deleted

## 2017-07-28 ENCOUNTER — Emergency Department (HOSPITAL_BASED_OUTPATIENT_CLINIC_OR_DEPARTMENT_OTHER): Admit: 2017-07-28 | Discharge: 2017-07-28 | Disposition: A | Discharge status: Home / Self Care | Payer: Self-pay

## 2017-07-28 ENCOUNTER — Emergency Department (HOSPITAL_COMMUNITY)
Admission: EM | Admit: 2017-07-28 | Discharge: 2017-07-28 | Disposition: A | Discharge status: Home / Self Care | Payer: Self-pay | Attending: Emergency Medicine | Admitting: Emergency Medicine

## 2017-07-28 DIAGNOSIS — M79609 Pain in unspecified limb: Secondary | ICD-10-CM

## 2017-07-28 DIAGNOSIS — Z79899 Other long term (current) drug therapy: Secondary | ICD-10-CM | POA: Insufficient documentation

## 2017-07-28 DIAGNOSIS — G5762 Lesion of plantar nerve, left lower limb: Secondary | ICD-10-CM | POA: Insufficient documentation

## 2017-07-28 DIAGNOSIS — M79662 Pain in left lower leg: Secondary | ICD-10-CM | POA: Insufficient documentation

## 2017-07-28 DIAGNOSIS — F17210 Nicotine dependence, cigarettes, uncomplicated: Secondary | ICD-10-CM | POA: Insufficient documentation

## 2017-07-28 DIAGNOSIS — T148XXA Other injury of unspecified body region, initial encounter: Secondary | ICD-10-CM

## 2017-07-28 DIAGNOSIS — B070 Plantar wart: Secondary | ICD-10-CM

## 2017-07-28 MED ORDER — IBUPROFEN 200 MG PO TABS
600.0000 mg | ORAL_TABLET | Freq: Once | ORAL | Status: AC
  Administered 2017-07-28: 600 mg via ORAL
  Filled 2017-07-28: qty 3

## 2017-07-28 MED ORDER — DICLOFENAC SODIUM 1 % TD GEL: 2.0000 g | Freq: Four times a day (QID) | TRANSDERMAL | 1 refills | Status: DC
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
## 2017-07-28 NOTE — Discharge Instructions (Signed)
Please see the information and instructions below regarding your visit.  Your diagnoses today include:  1. Pain of left lower leg   2. Morton's neuroma of left foot   3. Plantar wart   4. Pulled muscle     Tests performed today include: See side panel of your discharge paperwork for testing performed today. Vital signs are listed at the bottom of these instructions.   Lower extremity ultrasound-negative for a clot in the veins  Medications prescribed:    Take any prescribed medications only as prescribed, and any over the counter medications only as directed on the packaging.  Voltaren gel.  You may apply this up to 4 times daily on the area of pain.  Home care instructions:  Please follow any educational materials contained in this packet.   Please keep your leg elevated.  Follow-up instructions: Please follow-up with your primary care provider as soon as possible for further evaluation of your symptoms if they are not completely improved.   Return instructions:  Please return to the Emergency Department if you experience worsening symptoms.  Return to the emergency department for any loss of sensation in your lower extremity, white color to your lower extremity, weakness, increase in swelling, or redness of the left lower extremity. Please return if you have any other emergent concerns.  Additional Information: To find a primary care or specialty doctor please call [phone] or [phone] to access "[facility] Find a Doctor Service."  You may also go on the [facility] website at [email or web address]  There are also multiple [facility] and [facility] practices throughout the [place] that are frequently accepting new patients. You may find a clinic that is close to your home and contact them.   [facility] - [address] [place] [phone]  [facility] in [place] (also locations in [place] and [place]) - [address] [place] [phone]  [facility] - [address] [phone]    Your vital signs today were: BP (!) 149/108 (BP Location: Right Arm)    Pulse 71    Temp 97.9 F (36.6 C) (Oral)    Resp 20    Ht 5' 11.5" (1.816 m)    Wt 98 kg (216 lb)    SpO2 100%    BMI 29.71 kg/m  If your blood pressure (BP) was elevated on multiple readings during this visit above 130 for the top number or above 80 for the bottom number, please have this repeated by your primary care provider within one month. --------------  Thank you for allowing us to participate in your care today.

## 2017-07-28 NOTE — Progress Notes (Signed)
Left lower extremity venous duplex has been completed. Negative for DVT. Results were given to Dr. Eudelia Bunchardama.  07/28/17 3:41 PM Olen CordialGreg Celedonio Sortino RVT

## 2017-07-28 NOTE — ED Triage Notes (Signed)
Pt complains of pain in his left foot and left posterior thigh for the past week. Pt states he gets lumps that he has to massage occasionally but has been unable to get relief.

## 2017-11-10 ENCOUNTER — Emergency Department (HOSPITAL_COMMUNITY): Payer: Self-pay

## 2017-11-10 ENCOUNTER — Other Ambulatory Visit: Payer: Self-pay

## 2017-11-10 ENCOUNTER — Emergency Department (HOSPITAL_COMMUNITY)
Admission: EM | Admit: 2017-11-10 | Discharge: 2017-11-10 | Disposition: A | Discharge status: Home / Self Care | Payer: Self-pay | Attending: Emergency Medicine | Admitting: Emergency Medicine

## 2017-11-10 ENCOUNTER — Encounter (HOSPITAL_COMMUNITY): Payer: Self-pay

## 2017-11-10 DIAGNOSIS — Y908 Blood alcohol level of 240 mg/100 ml or more: Secondary | ICD-10-CM | POA: Insufficient documentation

## 2017-11-10 DIAGNOSIS — Y999 Unspecified external cause status: Secondary | ICD-10-CM | POA: Insufficient documentation

## 2017-11-10 DIAGNOSIS — X58XXXA Exposure to other specified factors, initial encounter: Secondary | ICD-10-CM | POA: Insufficient documentation

## 2017-11-10 DIAGNOSIS — Y92481 Parking lot as the place of occurrence of the external cause: Secondary | ICD-10-CM | POA: Insufficient documentation

## 2017-11-10 DIAGNOSIS — R4182 Altered mental status, unspecified: Secondary | ICD-10-CM | POA: Insufficient documentation

## 2017-11-10 DIAGNOSIS — Y939 Activity, unspecified: Secondary | ICD-10-CM | POA: Insufficient documentation

## 2017-11-10 DIAGNOSIS — Z87820 Personal history of traumatic brain injury: Secondary | ICD-10-CM | POA: Insufficient documentation

## 2017-11-10 DIAGNOSIS — S0001XA Abrasion of scalp, initial encounter: Secondary | ICD-10-CM | POA: Insufficient documentation

## 2017-11-10 DIAGNOSIS — F10920 Alcohol use, unspecified with intoxication, uncomplicated: Secondary | ICD-10-CM | POA: Insufficient documentation

## 2017-11-10 DIAGNOSIS — F17210 Nicotine dependence, cigarettes, uncomplicated: Secondary | ICD-10-CM | POA: Insufficient documentation

## 2017-11-10 LAB — COMPREHENSIVE METABOLIC PANEL
ALK PHOS: 85 U/L (ref 38–126)
ALT: 266 U/L — AB (ref 17–63)
AST: 170 U/L — ABNORMAL HIGH (ref 15–41)
Albumin: 4.1 g/dL (ref 3.5–5.0)
Anion gap: 12 (ref 5–15)
BUN: 18 mg/dL (ref 6–20)
CALCIUM: 8.5 mg/dL — AB (ref 8.9–10.3)
CO2: 25 mmol/L (ref 22–32)
CREATININE: 0.91 mg/dL (ref 0.61–1.24)
Chloride: 106 mmol/L (ref 101–111)
Glucose, Bld: 99 mg/dL (ref 65–99)
Potassium: 4.2 mmol/L (ref 3.5–5.1)
Sodium: 143 mmol/L (ref 135–145)
Total Bilirubin: 0.5 mg/dL (ref 0.3–1.2)
Total Protein: 7.3 g/dL (ref 6.5–8.1)

## 2017-11-10 LAB — CBC
HCT: 42.8 % (ref 39.0–52.0)
Hemoglobin: 14.7 g/dL (ref 13.0–17.0)
MCH: 32 pg (ref 26.0–34.0)
MCHC: 34.3 g/dL (ref 30.0–36.0)
MCV: 93.2 fL (ref 78.0–100.0)
PLATELETS: 209 10*3/uL (ref 150–400)
RBC: 4.59 MIL/uL (ref 4.22–5.81)
RDW: 13.2 % (ref 11.5–15.5)
WBC: 9.2 10*3/uL (ref 4.0–10.5)

## 2017-11-10 LAB — RAPID URINE DRUG SCREEN, HOSP PERFORMED
Amphetamines: POSITIVE — AB
BENZODIAZEPINES: NOT DETECTED
Barbiturates: NOT DETECTED
Cocaine: NOT DETECTED
Opiates: NOT DETECTED
Tetrahydrocannabinol: NOT DETECTED

## 2017-11-10 LAB — ETHANOL: ALCOHOL ETHYL (B): 344 mg/dL — AB (ref <10)
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
## 2017-11-10 NOTE — ED Notes (Signed)
Very small abrasion on back of head. No blood noted.

## 2017-11-10 NOTE — ED Notes (Signed)
Robert EMT moved patient from rm 11 to hallway B.

## 2018-03-15 ENCOUNTER — Ambulatory Visit: Payer: Self-pay | Attending: Internal Medicine | Admitting: Physician Assistant

## 2018-03-15 VITALS — BP 137/64 | HR 73 | Temp 98.0°F | Resp 18 | Ht 72.0 in | Wt 217.0 lb

## 2018-03-15 DIAGNOSIS — M79605 Pain in left leg: Secondary | ICD-10-CM

## 2018-03-15 DIAGNOSIS — M791 Myalgia, unspecified site: Secondary | ICD-10-CM | POA: Insufficient documentation

## 2018-03-15 DIAGNOSIS — B070 Plantar wart: Secondary | ICD-10-CM | POA: Insufficient documentation

## 2018-03-15 DIAGNOSIS — T148XXA Other injury of unspecified body region, initial encounter: Secondary | ICD-10-CM

## 2018-03-15 DIAGNOSIS — M79662 Pain in left lower leg: Secondary | ICD-10-CM | POA: Insufficient documentation

## 2018-03-15 MED ORDER — NAPROXEN 500 MG PO TABS: 500.0000 mg | ORAL_TABLET | Freq: Two times a day (BID) | ORAL | 1 refills | Status: DC

## 2018-03-15 MED ORDER — DICLOFENAC SODIUM 1 % TD GEL: 2.0000 g | Freq: Four times a day (QID) | TRANSDERMAL | 3 refills | Status: DC

## 2018-03-15 MED ORDER — DICLOFENAC SODIUM 1 % TD GEL: 2.0000 g | Freq: Four times a day (QID) | TRANSDERMAL | 1 refills | Status: DC

## 2018-03-15 MED ORDER — GABAPENTIN 300 MG PO CAPS: 300.0000 mg | ORAL_CAPSULE | Freq: Every day | ORAL | 1 refills | Status: DC

## 2018-03-15 MED FILL — NAPROXEN 500 MG TABLET: 500 | 30 days supply | Qty: 60 | Fill #0

## 2018-03-15 MED FILL — GABAPENTIN 300 MG CAPSULE: 300 | 30 days supply | Qty: 30 | Fill #0

## 2018-03-15 MED FILL — DICLOFENAC SODIUM 1% GEL: 1 | 12 days supply | Qty: 100 | Fill #0

## 2018-03-15 NOTE — Progress Notes (Signed)
Patient ID: Lucas Williams, male   DOB: 10-26-1975, 42 y.o.   MRN: [id number]   Lucas Williams, is a 42 y.o. male  ZHY:[id number]  [id number]  DOB - 10-26-1975  Subjective:  Chief Complaint and HPI: Lucas Williams is a 42 y.o. male here for L foot and lower leg pain.  He has a plantar wart on the bottom of his L foot that has been resistant to OTC treatment.  The wart causes him to walk differently which has increased his L lower leg pain.  Larey SeatFell from 3 story building in 2001 and has had problems with leg pain ever since.  voltaren gel helps some.  At night, he frequently has cramps and pain in his L lower leg only.  This has been happening for years.   No swelling or erythema of calf.    ROS:   Constitutional:  No f/c, No night sweats, No unexplained weight loss. EENT:  No vision changes, No blurry vision, No hearing changes. No mouth, throat, or ear problems.  Respiratory: No cough, No SOB Cardiac: No CP, no palpitations GI:  No abd pain, No N/V/D. GU: No Urinary s/sx Musculoskeletal: No joint pain Neuro: No headache, no dizziness, no motor weakness.  Skin: No rash Endocrine:  No polydipsia. No polyuria.  Psych: Denies SI/HI  No problems updated.  ALLERGIES: No Known Allergies  PAST MEDICAL HISTORY: Past Medical History:  Diagnosis Date  . Laceration of hand, left     MEDICATIONS AT HOME: Prior to Admission medications   Medication Sig Start Date End Date Taking? Authorizing Provider  diclofenac sodium (VOLTAREN) 1 % GEL Apply 2 g topically 4 (four) times daily. 03/15/18  Yes Hamzeh Tall, Marzella SchleinAngela M, PA-C  ibuprofen (ADVIL,MOTRIN) 200 MG tablet Take 800 mg every 6 (six) hours as needed by mouth for moderate pain.   Yes [provider]  gabapentin (NEURONTIN) 300 MG capsule Take 1 capsule (300 mg total) by mouth at bedtime. 03/15/18   Anders SimmondsMcClung, Indya Oliveria M, PA-C  naproxen (NAPROSYN) 500 MG tablet Take 1 tablet (500 mg total) by mouth 2 (two) times daily with a meal. Prn  pain 03/15/18   Anders SimmondsMcClung, Chidiebere Wynn M, PA-C     Objective:  EXAM:   Vitals:   03/15/18 0840  BP: 137/64  Pulse: 73  Resp: 18  Temp: 98 F (36.7 C)  TempSrc: Oral  SpO2: 100%  Weight: 217 lb (98.4 kg)  Height: 6' (1.829 m)    General appearance : A&OX3. NAD. Non-toxic-appearing HEENT: Atraumatic and Normocephalic.  PERRLA. EOM intact.   Neck: supple, no JVD. No cervical lymphadenopathy. No thyromegaly Chest/Lungs:  Breathing-non-labored, Good air entry bilaterally, breath sounds normal without rales, rhonchi, or wheezing  CVS: S1 S2 regular, no murmurs, gallops, rubs  Extremities: Bilateral Lower Ext shows no edema, both legs are warm to touch with = pulse throughout.  Lo visible abnormality of LLE.  Neg Homan's.  No erythema or swelling of calf.   Neurology:  CN II-XII grossly intact, Non focal.   Psych:  TP linear. J/I WNL. Normal speech. Appropriate eye contact and affect.  Skin:  No Rash.  Large ~1cm plantar wart on plantar surface-mid-foot.    Data Review No results found for: HGBA1C   Assessment & Plan   1. Plantar wart -will likely require excision due to size.   - Ambulatory referral to Podiatry  2. Pulled muscle/muscle pain - diclofenac sodium (VOLTAREN) 1 % GEL; Apply 2 g topically 4 (four) times daily.  Dispense: 100  g; Refill: 1  3. Pain of left lower extremity No red flags - naproxen (NAPROSYN) 500 MG tablet; Take 1 tablet (500 mg total) by mouth 2 (two) times daily with a meal. Prn pain  Dispense: 60 tablet; Refill: 1 - gabapentin (NEURONTIN) 300 MG capsule; Take 1 capsule (300 mg total) by mouth at bedtime.  Dispense: 90 capsule; Refill: 1   Patient have been counseled extensively about nutrition and exercise  Return in about 3 months (around 06/15/2018) for Dr Laural Benes follow-up.  The patient was given clear instructions to go to ER or return to medical center if symptoms don't improve, worsen or new problems develop. The patient verbalized understanding.  The patient was told to call to get lab results if they haven't heard anything in the next week.     Georgian Co, PA-C [facility] and [facility] [place], [place] [phone]   03/15/2018, 8:49 AM

## 2018-04-28 MED FILL — GABAPENTIN 300 MG CAPSULE: 300 | 30 days supply | Qty: 30 | Fill #1

## 2018-04-28 MED FILL — NAPROXEN 500 MG TABLET: 500 | 30 days supply | Qty: 60 | Fill #1

## 2018-04-28 MED FILL — DICLOFENAC SODIUM 1% GEL: 1 | 12 days supply | Qty: 100 | Fill #1

## 2018-11-26 ENCOUNTER — Other Ambulatory Visit: Payer: Self-pay

## 2018-11-26 ENCOUNTER — Emergency Department (HOSPITAL_COMMUNITY)
Admission: EM | Admit: 2018-11-26 | Discharge: 2018-11-26 | Disposition: A | Discharge status: Home / Self Care | Payer: Self-pay | Attending: Emergency Medicine | Admitting: Emergency Medicine

## 2018-11-26 ENCOUNTER — Emergency Department (HOSPITAL_COMMUNITY): Payer: Self-pay

## 2018-11-26 DIAGNOSIS — X58XXXA Exposure to other specified factors, initial encounter: Secondary | ICD-10-CM | POA: Insufficient documentation

## 2018-11-26 DIAGNOSIS — F17210 Nicotine dependence, cigarettes, uncomplicated: Secondary | ICD-10-CM | POA: Insufficient documentation

## 2018-11-26 DIAGNOSIS — F10120 Alcohol abuse with intoxication, uncomplicated: Secondary | ICD-10-CM | POA: Insufficient documentation

## 2018-11-26 DIAGNOSIS — Y929 Unspecified place or not applicable: Secondary | ICD-10-CM | POA: Insufficient documentation

## 2018-11-26 DIAGNOSIS — Y939 Activity, unspecified: Secondary | ICD-10-CM | POA: Insufficient documentation

## 2018-11-26 DIAGNOSIS — Z79899 Other long term (current) drug therapy: Secondary | ICD-10-CM | POA: Insufficient documentation

## 2018-11-26 DIAGNOSIS — S060X9A Concussion with loss of consciousness of unspecified duration, initial encounter: Secondary | ICD-10-CM | POA: Insufficient documentation

## 2018-11-26 DIAGNOSIS — F10920 Alcohol use, unspecified with intoxication, uncomplicated: Secondary | ICD-10-CM

## 2018-11-26 DIAGNOSIS — Y999 Unspecified external cause status: Secondary | ICD-10-CM | POA: Insufficient documentation

## 2018-11-26 LAB — CBG MONITORING, ED
GLUCOSE-CAPILLARY: 94 mg/dL (ref 70–99)
Glucose-Capillary: 95 mg/dL (ref 70–99)

## 2018-11-26 NOTE — ED Notes (Addendum)
Pt now A&Ox4, cooperative and sober

## 2018-11-26 NOTE — ED Notes (Addendum)
Pt ambulated to bathroom without assistance.  Pt changed into paper scrub pants.  Pt has jeans in pt belongs bag at bedside, as well as a blue hoodie, and socks and black tennis shoes.
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
## 2018-11-26 NOTE — ED Notes (Signed)
Bed: [facility] Expected date:  Expected time:  Means of arrival:  Comments: 43 yo ETOH, head injury

## 2018-11-26 NOTE — ED Notes (Signed)
Patient transported to CT 

## 2018-11-26 NOTE — ED Notes (Signed)
Pt states he has had 4 beers and 3 shots since midnight last night

## 2018-11-26 NOTE — ED Provider Notes (Signed)
[facility]-EMERGENCY DEPT Provider Note   CSN: [id number] Arrival date & time: 11/26/18  6063    History   Chief Complaint Chief Complaint  Patient presents with  . Alcohol Intoxication  Level 5 caveat due to alcohol intoxication  HPI Lucas Williams is a 43 y.o. male.     The history is provided by the patient and the EMS personnel.  Alcohol Intoxication  This is a new problem. The problem occurs constantly. The problem has not changed since onset.Nothing aggravates the symptoms. Nothing relieves the symptoms.  Patient brought in by EMS after being found intoxicated at [employer] .  Patient apparently fell from standing has signs of a head injury.  Patient admits to having multiple beers and shots since midnight.  Past Medical History:  Diagnosis Date  . Laceration of hand, left     Patient Active Problem List   Diagnosis Date Noted  . Alcohol abuse 02/11/2017  . 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 Pulled muscle 02/11/2017  . TBI (traumatic brain injury) (HCC) 01/29/2017    Past Surgical History:  Procedure Laterality Date  . FINGER SURGERY     r/t staph infection I&D, hand lac cutting trees  . HERNIA REPAIR     LT ihr  . NERVE REPAIR Left 12/11/2012   Procedure: Left Hand Exploration Wound with Nerve Repair;  Surgeon: Tami Ribas, MD;  Location: [facility];  Service: Orthopedics;  Laterality: Left;        Home Medications    Prior to Admission medications   Medication Sig Start Date End Date Taking? Authorizing Provider  diclofenac sodium (VOLTAREN) 1 % GEL Apply 2 g topically 4 (four) times daily. 03/15/18   Anders Simmonds, PA-C  gabapentin (NEURONTIN) 300 MG capsule Take 1 capsule (300 mg total) by mouth at bedtime. 03/15/18   Anders Simmonds, PA-C  ibuprofen (ADVIL,MOTRIN) 200 MG tablet Take 800 mg every 6 (six) hours as needed by mouth for moderate pain.    [provider]    naproxen (NAPROSYN) 500 MG tablet Take 1 tablet (500 mg total) by mouth 2 (two) times daily with a meal. Prn pain 03/15/18   Anders Simmonds, PA-C    Family History No family history on file.  Social History Social History   Tobacco Use  . Smoking status: Current Every Day Smoker    Packs/day: 0.50  . Smokeless tobacco: Current User  Substance Use Topics  . Alcohol use: Yes    Comment: DAILY  . Drug use: No     Allergies   Patient has no known allergies.   Review of Systems Review of Systems  Unable to perform ROS: Mental status change  alcohol intoxication  Physical Exam Updated Vital Signs BP (!) 127/105   Pulse 60   Temp (!) 97.4 F (36.3 C) (Oral)   Resp 19   SpO2 98%   Physical Exam CONSTITUTIONAL: Disheveled, no acute distress HEAD: Small hematoma noted to occiput, no signs of trauma EYES: EOMI/PERRL ENMT: Mucous membranes moist NECK: supple no meningeal signs SPINE/BACK:entire spine nontender CV: S1/S2 noted, no murmurs/rubs/gallops noted LUNGS: Lungs are clear to auscultation bilaterally, no apparent distress ABDOMEN: soft, nontender NEURO: Pt is awake/alert/appropriate, moves all extremitiesx4.  No facial droop.   EXTREMITIES: pulses normal/equal, full ROM, no signs of trauma, no lacerations to hands SKIN: warm, color normal    ED Treatments / Results  Labs (all labs ordered are listed, but only  abnormal results are displayed) Labs Reviewed  CBG MONITORING, ED  CBG MONITORING, ED    EKG None  Radiology Ct Head Wo Contrast  Result Date: 11/26/2018 CLINICAL DATA:  Intoxicated, fall from standing EXAM: CT HEAD WITHOUT CONTRAST TECHNIQUE: Contiguous axial images were obtained from the base of the skull through the vertex without intravenous contrast. COMPARISON:  11/10/2017 FINDINGS: Brain: No evidence of acute infarction, hemorrhage, hydrocephalus, extra-axial collection or mass lesion/mass effect. Unchanged encephalomalacia of the right  hemisphere, most conspicuous in the right frontal pole. Vascular: No hyperdense vessel or unexpected calcification. Skull: Normal. Negative for fracture or focal lesion. Sinuses/Orbits: No acute finding. Other: Soft tissue hematoma of the left occiput. IMPRESSION: 1. No acute intracranial pathology. Unchanged encephalomalacia of the right hemisphere. 2. Soft tissue hematoma of the left occiput. Electronically Signed   By: Lauralyn Primes M.D.   On: 11/26/2018 10:48    Procedures Procedures   Medications Ordered in ED Medications - No data to display   Initial Impression / Assessment and Plan / ED Course  I have reviewed the triage vital signs and the nursing notes.  Pertinent  Labs/imaging results that were available during my care of the patient were reviewed by me and considered in my medical decision making (see chart for details).        11:53 AM Patient was found intoxicated at a [employer]. He ambulated in the ER, but is now asleep.  Glucose normal.  CT head negative.  Once sober he will be discharged 12:32 PM Pt is more alert.  He is already been walking. Will discharge Final Clinical Impressions(s) / ED Diagnoses   Final diagnoses:  Alcoholic intoxication without complication (HCC)  Concussion with loss of consciousness, initial encounter    ED Discharge Orders    None       Zadie Rhine, MD 11/26/18 1232

## 2018-12-25 ENCOUNTER — Other Ambulatory Visit: Payer: Self-pay

## 2018-12-25 ENCOUNTER — Ambulatory Visit: Payer: Self-pay | Admitting: Podiatry

## 2018-12-25 ENCOUNTER — Encounter: Payer: Self-pay | Admitting: Podiatry

## 2018-12-25 VITALS — BP 139/86 | HR 75

## 2018-12-25 DIAGNOSIS — B070 Plantar wart: Secondary | ICD-10-CM

## 2018-12-25 NOTE — Progress Notes (Signed)
   Subjective: Patient presents today with pain and tenderness on the plantar aspect of the left foot secondary to a plantars wart. Patient states that the pain has been present for several weeks now. Patient denies trauma.   Past Medical History:  Diagnosis Date  . Laceration of hand, left     Objective: Physical Exam General: The patient is alert and oriented x3 in no acute distress.   Dermatology: Hyperkeratotic skin lesion noted to the plantar aspect of the left foot approximately 1 cm in diameter. Pinpoint bleeding noted upon debridement. Skin is warm, dry and supple bilateral lower extremities. Negative for open lesions or macerations.   Vascular: Palpable pedal pulses bilaterally. No edema or erythema noted. Capillary refill within normal limits.   Neurological: Epicritic and protective threshold grossly intact bilaterally.    Musculoskeletal Exam: Pain on palpation to the noted skin lesion.  Range of motion within normal limits to all pedal and ankle joints bilateral. Muscle strength 5/5 in all groups bilateral.    Assessment: #1 plantar wart left foot #2 pain in left foot     Plan of Care:  #1 Patient was evaluated. #2 Excisional debridement of the plantar wart lesion was performed using a chisel blade. Cantharone was applied and the lesion was dressed with a dry sterile dressing. #3 patient is to return to clinic in 2 weeks  Felecia Shelling, DPM [facility]  Dr. Felecia Shelling, DPM    [address]                                        [place], [place] [address]                Office [phone]  Fax [phone]

## 2018-12-27 ENCOUNTER — Encounter: Payer: Self-pay | Admitting: Podiatry

## 2019-01-08 ENCOUNTER — Other Ambulatory Visit: Payer: Self-pay

## 2019-01-08 ENCOUNTER — Ambulatory Visit (INDEPENDENT_AMBULATORY_CARE_PROVIDER_SITE_OTHER): Payer: Self-pay | Admitting: Podiatry

## 2019-01-08 VITALS — Temp 97.7°F

## 2019-01-08 DIAGNOSIS — M722 Plantar fascial fibromatosis: Secondary | ICD-10-CM

## 2019-01-08 DIAGNOSIS — B070 Plantar wart: Secondary | ICD-10-CM

## 2019-01-08 NOTE — Progress Notes (Signed)
   Subjective: Patient presents today for follow-up evaluation of a plantar wart to the left lower extremity. Patient presents today for follow-up treatment and evaluation   Objective: Physical Exam General: The patient is alert and oriented x3 in no acute distress.   Dermatology: Hyperkeratotic skin lesion noted to the plantar aspect of the left foot approximately 1 cm in diameter. Skin is warm, dry and supple bilateral lower extremities. Negative for open lesions or macerations.   Vascular: Palpable pedal pulses bilaterally. No edema or erythema noted. Capillary refill within normal limits.   Neurological: Epicritic and protective threshold grossly intact bilaterally.    Musculoskeletal Exam: Pain on palpation to the noted skin lesion.  Range of motion within normal limits to all pedal and ankle joints bilateral. Muscle strength 5/5 in all groups bilateral.    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 #2 Excisional debridement of the plantar wart lesion was performed using a chisel blade.  Recommend over-the-counter wart remover x2 weeks #3  Injection of 0.5 cc Celestone Soluspan injected into the plantar fibroma area of the left foot along the mid substance of the plantar fascia #4 return to clinic prn   * manager at [facility], DPM [facility]  Dr. Felecia Shelling, DPM    [name] [place], [place] [address]                Office [phone]  Fax [phone]

## 2019-05-28 ENCOUNTER — Emergency Department (HOSPITAL_COMMUNITY)
Admission: EM | Admit: 2019-05-28 | Discharge: 2019-05-28 | Disposition: A | Discharge status: Home / Self Care | Payer: Self-pay | Attending: Emergency Medicine | Admitting: Emergency Medicine

## 2019-05-28 ENCOUNTER — Emergency Department (HOSPITAL_COMMUNITY): Payer: Self-pay

## 2019-05-28 ENCOUNTER — Encounter (HOSPITAL_COMMUNITY): Payer: Self-pay | Admitting: Emergency Medicine

## 2019-05-28 DIAGNOSIS — F17210 Nicotine dependence, cigarettes, uncomplicated: Secondary | ICD-10-CM | POA: Insufficient documentation

## 2019-05-28 DIAGNOSIS — R4182 Altered mental status, unspecified: Secondary | ICD-10-CM | POA: Insufficient documentation

## 2019-05-28 DIAGNOSIS — R112 Nausea with vomiting, unspecified: Secondary | ICD-10-CM | POA: Insufficient documentation

## 2019-05-28 DIAGNOSIS — R569 Unspecified convulsions: Secondary | ICD-10-CM | POA: Insufficient documentation

## 2019-05-28 DIAGNOSIS — R197 Diarrhea, unspecified: Secondary | ICD-10-CM | POA: Insufficient documentation

## 2019-05-28 DIAGNOSIS — F17228 Nicotine dependence, chewing tobacco, with other nicotine-induced disorders: Secondary | ICD-10-CM | POA: Insufficient documentation

## 2019-05-28 DIAGNOSIS — R109 Unspecified abdominal pain: Secondary | ICD-10-CM | POA: Insufficient documentation

## 2019-05-28 LAB — URINALYSIS, ROUTINE W REFLEX MICROSCOPIC
Bacteria, UA: NONE SEEN
Bilirubin Urine: NEGATIVE
Glucose, UA: NEGATIVE mg/dL
Ketones, ur: 5 mg/dL — AB
Leukocytes,Ua: NEGATIVE
Nitrite: NEGATIVE
Protein, ur: NEGATIVE mg/dL
Specific Gravity, Urine: 1.008 (ref 1.005–1.030)
pH: 5 (ref 5.0–8.0)

## 2019-05-28 LAB — POCT I-STAT EG7
Acid-base deficit: 1 mmol/L (ref 0.0–2.0)
Bicarbonate: 23.5 mmol/L (ref 20.0–28.0)
Calcium, Ion: 1.11 mmol/L — ABNORMAL LOW (ref 1.15–1.40)
HCT: 45 % (ref 39.0–52.0)
Hemoglobin: 15.3 g/dL (ref 13.0–17.0)
O2 Saturation: 96 %
Potassium: 3.9 mmol/L (ref 3.5–5.1)
Sodium: 136 mmol/L (ref 135–145)
TCO2: 25 mmol/L (ref 22–32)
pCO2, Ven: 37.2 mmHg — ABNORMAL LOW (ref 44.0–60.0)
pH, Ven: 7.408 (ref 7.250–7.430)
pO2, Ven: 84 mmHg — ABNORMAL HIGH (ref 32.0–45.0)

## 2019-05-28 LAB — COMPREHENSIVE METABOLIC PANEL
ALT: 175 U/L — ABNORMAL HIGH (ref 0–44)
AST: 151 U/L — ABNORMAL HIGH (ref 15–41)
Albumin: 4.2 g/dL (ref 3.5–5.0)
Alkaline Phosphatase: 92 U/L (ref 38–126)
Anion gap: 22 — ABNORMAL HIGH (ref 5–15)
BUN: 15 mg/dL (ref 6–20)
CO2: 15 mmol/L — ABNORMAL LOW (ref 22–32)
Calcium: 8.8 mg/dL — ABNORMAL LOW (ref 8.9–10.3)
Chloride: 100 mmol/L (ref 98–111)
Creatinine, Ser: 1.14 mg/dL (ref 0.61–1.24)
GFR calc Af Amer: >60 mL/min (ref >60)
GFR calc non Af Amer: >60 mL/min (ref >60)
Glucose, Bld: 125 mg/dL — ABNORMAL HIGH (ref 70–99)
Potassium: 4.1 mmol/L (ref 3.5–5.1)
Sodium: 137 mmol/L (ref 135–145)
Total Bilirubin: 0.6 mg/dL (ref 0.3–1.2)
Total Protein: 7.5 g/dL (ref 6.5–8.1)

## 2019-05-28 LAB — LACTIC ACID, PLASMA
Lactic Acid, Venous: 2.4 mmol/L (ref 0.5–1.9)
Lactic Acid, Venous: 1.4 mmol/L (ref 0.5–1.9)

## 2019-05-28 LAB — RAPID URINE DRUG SCREEN, HOSP PERFORMED
Amphetamines: NOT DETECTED
Barbiturates: NOT DETECTED
Benzodiazepines: NOT DETECTED
Cocaine: NOT DETECTED
Opiates: NOT DETECTED
Tetrahydrocannabinol: NOT DETECTED

## 2019-05-28 LAB — CBC
HCT: 47.4 % (ref 39.0–52.0)
Hemoglobin: 15.6 g/dL (ref 13.0–17.0)
MCH: 31.5 pg (ref 26.0–34.0)
MCHC: 32.9 g/dL (ref 30.0–36.0)
MCV: 95.8 fL (ref 80.0–100.0)
Platelets: 228 10*3/uL (ref 150–400)
RBC: 4.95 MIL/uL (ref 4.22–5.81)
RDW: 13.5 % (ref 11.5–15.5)
WBC: 14 10*3/uL — ABNORMAL HIGH (ref 4.0–10.5)
nRBC: 0 % (ref 0.0–0.2)

## 2019-05-28 LAB — ETHANOL: Alcohol, Ethyl (B): <10 mg/dL (ref <10)

## 2019-05-28 LAB — CBG MONITORING, ED: Glucose-Capillary: 98 mg/dL (ref 70–99)

## 2019-05-28 LAB — LIPASE, BLOOD: Lipase: 20 U/L (ref 11–51)

## 2019-05-28 MED ORDER — ONDANSETRON 4 MG PO TBDP: 4.0000 mg | ORAL_TABLET | Freq: Three times a day (TID) | ORAL | 0 refills | Status: DC | PRN

## 2019-05-28 MED ORDER — LEVETIRACETAM 500 MG PO TABS: 500.0000 mg | ORAL_TABLET | Freq: Two times a day (BID) | ORAL | 0 refills | Status: DC

## 2019-05-28 MED ORDER — ACETAMINOPHEN 325 MG PO TABS
650.0000 mg | ORAL_TABLET | Freq: Once | ORAL | Status: AC
  Administered 2019-05-28: 21:00:00 650 mg via ORAL
  Filled 2019-05-28: qty 2

## 2019-05-28 MED ORDER — SODIUM CHLORIDE 0.9 % IV BOLUS
1000.0000 mL | Freq: Once | INTRAVENOUS | Status: AC
  Administered 2019-05-28: 17:00:00 1000 mL via INTRAVENOUS

## 2019-05-28 MED ORDER — SODIUM CHLORIDE 0.9% FLUSH: 3.0000 mL | Freq: Once | INTRAVENOUS | Status: DC
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
## 2019-05-28 NOTE — ED Triage Notes (Addendum)
Pt is coming from [employer] where co workers found him lying on the floor not responding on ems arrival he was lying on his right side with eyes open with alert to voice but no answering questions or following commands. Ems reports almost postictal like behavior. Pt has since became alert and oriented able to answer questions and follow commands. Hx of TBI, no known hx of seizure.   142/80 110 ST 90% RA  113 CBG

## 2019-05-28 NOTE — ED Notes (Signed)
Patient Alert and oriented to baseline. Stable and ambulatory to baseline. Patient verbalized understanding of the discharge instructions.  Patient belongings were taken by the patient.   

## 2019-08-05 ENCOUNTER — Encounter (HOSPITAL_COMMUNITY): Payer: Self-pay

## 2019-08-05 ENCOUNTER — Emergency Department (HOSPITAL_COMMUNITY)
Admission: EM | Admit: 2019-08-05 | Discharge: 2019-08-05 | Disposition: A | Discharge status: Home / Self Care | Payer: Self-pay | Attending: Emergency Medicine | Admitting: Emergency Medicine

## 2019-08-05 ENCOUNTER — Other Ambulatory Visit: Payer: Self-pay

## 2019-08-05 DIAGNOSIS — Z79899 Other long term (current) drug therapy: Secondary | ICD-10-CM | POA: Insufficient documentation

## 2019-08-05 DIAGNOSIS — R569 Unspecified convulsions: Secondary | ICD-10-CM | POA: Insufficient documentation

## 2019-08-05 DIAGNOSIS — Z87820 Personal history of traumatic brain injury: Secondary | ICD-10-CM | POA: Insufficient documentation

## 2019-08-05 DIAGNOSIS — F17210 Nicotine dependence, cigarettes, uncomplicated: Secondary | ICD-10-CM | POA: Insufficient documentation

## 2019-08-05 LAB — BASIC METABOLIC PANEL
Anion gap: 17 — ABNORMAL HIGH (ref 5–15)
BUN: 14 mg/dL (ref 6–20)
CO2: 17 mmol/L — ABNORMAL LOW (ref 22–32)
Calcium: 9 mg/dL (ref 8.9–10.3)
Chloride: 105 mmol/L (ref 98–111)
Creatinine, Ser: 0.99 mg/dL (ref 0.61–1.24)
GFR calc Af Amer: >60 mL/min (ref >60)
GFR calc non Af Amer: >60 mL/min (ref >60)
Glucose, Bld: 142 mg/dL — ABNORMAL HIGH (ref 70–99)
Potassium: 4.3 mmol/L (ref 3.5–5.1)
Sodium: 139 mmol/L (ref 135–145)

## 2019-08-05 LAB — CBC WITH DIFFERENTIAL/PLATELET
Abs Immature Granulocytes: 0.05 10*3/uL (ref 0.00–0.07)
Basophils Absolute: 0 10*3/uL (ref 0.0–0.1)
Basophils Relative: 1 %
Eosinophils Absolute: 0.1 10*3/uL (ref 0.0–0.5)
Eosinophils Relative: 1 %
HCT: 48.9 % (ref 39.0–52.0)
Hemoglobin: 16.4 g/dL (ref 13.0–17.0)
Immature Granulocytes: 1 %
Lymphocytes Relative: 15 %
Lymphs Abs: 1.2 10*3/uL (ref 0.7–4.0)
MCH: 32 pg (ref 26.0–34.0)
MCHC: 33.5 g/dL (ref 30.0–36.0)
MCV: 95.3 fL (ref 80.0–100.0)
Monocytes Absolute: 0.6 10*3/uL (ref 0.1–1.0)
Monocytes Relative: 8 %
Neutro Abs: 6 10*3/uL (ref 1.7–7.7)
Neutrophils Relative %: 74 %
Platelets: 198 10*3/uL (ref 150–400)
RBC: 5.13 MIL/uL (ref 4.22–5.81)
RDW: 13.2 % (ref 11.5–15.5)
WBC: 7.9 10*3/uL (ref 4.0–10.5)
nRBC: 0 % (ref 0.0–0.2)

## 2019-08-05 LAB — ETHANOL: Alcohol, Ethyl (B): 21 mg/dL — ABNORMAL HIGH (ref <10)

## 2019-08-05 MED ORDER — LEVETIRACETAM IN NACL 500 MG/100ML IV SOLN
500.0000 mg | Freq: Once | INTRAVENOUS | Status: AC
  Administered 2019-08-05: 500 mg via INTRAVENOUS
  Filled 2019-08-05: qty 100

## 2019-08-05 MED ORDER — LORAZEPAM 2 MG/ML IJ SOLN
1.0000 mg | Freq: Once | INTRAMUSCULAR | Status: AC
  Administered 2019-08-05: 1 mg via INTRAVENOUS
  Filled 2019-08-05: qty 1
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
## 2019-08-05 NOTE — ED Triage Notes (Signed)
He was seen by peers at an [facility] to have a brief ("less than a minute") tonic-clonic seizure. Pt. Arrives awake and drowsy and tells [place] that he is on an anti-seizure medication, however, he cannot recall the name of it. He has no c/o pain or discomfort.

## 2019-08-05 NOTE — Discharge Instructions (Addendum)
Please return for any problem. Follow up with your regular care provider as instructed. Follow up with [facility] Neurology as instructed.   Drink Alcohol in moderation.

## 2019-10-03 ENCOUNTER — Other Ambulatory Visit: Payer: Self-pay

## 2019-10-03 ENCOUNTER — Emergency Department (HOSPITAL_COMMUNITY)
Admission: EM | Admit: 2019-10-03 | Discharge: 2019-10-03 | Discharge status: Against Medical Advice | Payer: Self-pay | Attending: Emergency Medicine | Admitting: Emergency Medicine

## 2019-10-03 DIAGNOSIS — Z5321 Procedure and treatment not carried out due to patient leaving prior to being seen by health care provider: Secondary | ICD-10-CM | POA: Insufficient documentation

## 2019-10-03 NOTE — ED Triage Notes (Signed)
Pt reports chronic bilateral leg pain, worse on left side. Reports he has been drinking beer to help with the pain today. Hx of a fall in 2001.

## 2019-10-03 NOTE — ED Notes (Signed)
PT called x3, no answer  

## 2019-10-26 ENCOUNTER — Emergency Department (HOSPITAL_COMMUNITY)
Admission: EM | Admit: 2019-10-26 | Discharge: 2019-10-26 | Disposition: A | Discharge status: Home / Self Care | Payer: Self-pay | Attending: Emergency Medicine | Admitting: Emergency Medicine

## 2019-10-26 ENCOUNTER — Encounter (HOSPITAL_COMMUNITY): Payer: Self-pay | Admitting: Emergency Medicine

## 2019-10-26 ENCOUNTER — Emergency Department (HOSPITAL_COMMUNITY): Payer: Self-pay

## 2019-10-26 DIAGNOSIS — F17220 Nicotine dependence, chewing tobacco, uncomplicated: Secondary | ICD-10-CM | POA: Insufficient documentation

## 2019-10-26 DIAGNOSIS — S20229A Contusion of unspecified back wall of thorax, initial encounter: Secondary | ICD-10-CM | POA: Insufficient documentation

## 2019-10-26 DIAGNOSIS — Z79899 Other long term (current) drug therapy: Secondary | ICD-10-CM | POA: Insufficient documentation

## 2019-10-26 DIAGNOSIS — Y929 Unspecified place or not applicable: Secondary | ICD-10-CM | POA: Insufficient documentation

## 2019-10-26 DIAGNOSIS — Y9301 Activity, walking, marching and hiking: Secondary | ICD-10-CM | POA: Insufficient documentation

## 2019-10-26 DIAGNOSIS — F17200 Nicotine dependence, unspecified, uncomplicated: Secondary | ICD-10-CM | POA: Insufficient documentation

## 2019-10-26 DIAGNOSIS — M79651 Pain in right thigh: Secondary | ICD-10-CM | POA: Insufficient documentation

## 2019-10-26 DIAGNOSIS — Y999 Unspecified external cause status: Secondary | ICD-10-CM | POA: Insufficient documentation

## 2019-10-26 MED ORDER — IBUPROFEN 600 MG PO TABS: 600.0000 mg | ORAL_TABLET | Freq: Four times a day (QID) | ORAL | 0 refills | Status: DC | PRN

## 2019-10-26 MED ORDER — IBUPROFEN 200 MG PO TABS
600.0000 mg | ORAL_TABLET | Freq: Once | ORAL | Status: AC
  Administered 2019-10-26: 06:00:00 600 mg via ORAL
  Filled 2019-10-26: qty 3

## 2019-10-26 MED FILL — IBUPROFEN 600 MG TABLET: 600 | 10 days supply | Qty: 30 | Fill #0
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
## 2019-10-26 NOTE — Discharge Instructions (Addendum)
Your back x-rays are negative for any broken bone. Recommend ibuprofen for pain of back and thigh. Cool compresses, rest.

## 2019-10-28 ENCOUNTER — Emergency Department (HOSPITAL_COMMUNITY): Payer: Self-pay

## 2019-10-28 ENCOUNTER — Emergency Department (HOSPITAL_COMMUNITY)
Admission: EM | Admit: 2019-10-28 | Discharge: 2019-10-28 | Disposition: A | Discharge status: Home / Self Care | Payer: Self-pay | Attending: Emergency Medicine | Admitting: Emergency Medicine

## 2019-10-28 ENCOUNTER — Encounter (HOSPITAL_COMMUNITY): Payer: Self-pay

## 2019-10-28 DIAGNOSIS — R569 Unspecified convulsions: Secondary | ICD-10-CM | POA: Insufficient documentation

## 2019-10-28 DIAGNOSIS — F17210 Nicotine dependence, cigarettes, uncomplicated: Secondary | ICD-10-CM | POA: Insufficient documentation

## 2019-10-28 DIAGNOSIS — Z79899 Other long term (current) drug therapy: Secondary | ICD-10-CM | POA: Insufficient documentation

## 2019-10-28 LAB — BASIC METABOLIC PANEL
Anion gap: 20 — ABNORMAL HIGH (ref 5–15)
BUN: 15 mg/dL (ref 6–20)
CO2: 20 mmol/L — ABNORMAL LOW (ref 22–32)
Calcium: 9 mg/dL (ref 8.9–10.3)
Chloride: 97 mmol/L — ABNORMAL LOW (ref 98–111)
Creatinine, Ser: 1.09 mg/dL (ref 0.61–1.24)
GFR calc Af Amer: >60 mL/min (ref >60)
GFR calc non Af Amer: >60 mL/min (ref >60)
Glucose, Bld: 124 mg/dL — ABNORMAL HIGH (ref 70–99)
Potassium: 3.8 mmol/L (ref 3.5–5.1)
Sodium: 137 mmol/L (ref 135–145)

## 2019-10-28 LAB — CBC
HCT: 44.4 % (ref 39.0–52.0)
Hemoglobin: 14.9 g/dL (ref 13.0–17.0)
MCH: 31.2 pg (ref 26.0–34.0)
MCHC: 33.6 g/dL (ref 30.0–36.0)
MCV: 93.1 fL (ref 80.0–100.0)
Platelets: 233 10*3/uL (ref 150–400)
RBC: 4.77 MIL/uL (ref 4.22–5.81)
RDW: 13.2 % (ref 11.5–15.5)
WBC: 13.8 10*3/uL — ABNORMAL HIGH (ref 4.0–10.5)
nRBC: 0 % (ref 0.0–0.2)

## 2019-10-28 LAB — CBG MONITORING, ED: Glucose-Capillary: 124 mg/dL — ABNORMAL HIGH (ref 70–99)

## 2019-10-28 MED ORDER — LEVETIRACETAM IN NACL 1000 MG/100ML IV SOLN
1000.0000 mg | Freq: Once | INTRAVENOUS | Status: AC
  Administered 2019-10-28: 1000 mg via INTRAVENOUS
  Filled 2019-10-28: qty 100

## 2019-10-28 MED ORDER — LEVETIRACETAM 500 MG PO TABS: 500.0000 mg | ORAL_TABLET | Freq: Two times a day (BID) | ORAL | 1 refills | Status: DC

## 2019-10-28 MED ORDER — LORAZEPAM 2 MG/ML IJ SOLN
1.0000 mg | Freq: Once | INTRAMUSCULAR | Status: AC
  Administered 2019-10-28: 1 mg via INTRAVENOUS
  Filled 2019-10-28: qty 1

## 2019-10-28 NOTE — ED Notes (Signed)
Transported to CT 

## 2019-10-28 NOTE — ED Provider Notes (Signed)
[facility]-EMERGENCY DEPT Provider Note   CSN: [id number] Arrival date & time: 10/28/19  1213     History Chief Complaint  Patient presents with  . Seizures  . Altered Mental Status    Lucas Williams is a 44 y.o. male.  44 year old male with prior medical history as detailed below presents for evaluation following reported seizure.  Patient was witnessed to have his seizure-like episode.  He was brought to the ED by EMS.  Upon initial EMS evaluation the patient was noted to be postictal.  Patient is significantly improved with essentially normal mentation upon arrival to the ED.  Patient reports that he is not currently taking any antiepileptic medications.  He is unsure of his last seizure.  He denies current injury or other complaint.  The history is provided by the patient and medical records.  Seizures Seizure type:  Unable to specify Initial focality:  None Episode characteristics: focal shaking   Episode characteristics: no apnea   Severity:  Mild Duration:  5 minutes Timing:  Once Progression:  Resolved      Past Medical History:  Diagnosis Date  . Laceration of hand, left     Patient Active Problem List   Diagnosis Date Noted  . Alcohol abuse 02/11/2017  . 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 Pulled muscle 02/11/2017  . TBI (traumatic brain injury) (HCC) 01/29/2017    Past Surgical History:  Procedure Laterality Date  . FINGER SURGERY     r/t staph infection I&D, hand lac cutting trees  . HERNIA REPAIR     LT ihr  . NERVE REPAIR Left 12/11/2012   Procedure: Left Hand Exploration Wound with Nerve Repair;  Surgeon: Tami Ribas, MD;  Location: [facility];  Service: Orthopedics;  Laterality: Left;       No family history on file.  Social History   Tobacco Use  . Smoking status: Current Every Day Smoker    Packs/day: 0.50  . Smokeless tobacco: Current User  Substance Use Topics  .  Alcohol use: Yes    Alcohol/week: 6.0 standard drinks    Types: 2 Shots of liquor, 4 Cans of beer per week    Comment: weekly  . Drug use: No    Home Medications Prior to Admission medications   Medication Sig Start Date End Date Taking? Authorizing Provider  ibuprofen (ADVIL) 600 MG tablet Take 1 tablet (600 mg total) by mouth every 6 (six) hours as needed. 10/26/19   Elpidio Anis, PA-C  levETIRAcetam (KEPPRA) 500 MG tablet Take 1 tablet (500 mg total) by mouth 2 (two) times daily. 05/28/19   Ignacia Palma, MD  ondansetron (ZOFRAN ODT) 4 MG disintegrating tablet Take 1 tablet (4 mg total) by mouth every 8 (eight) hours as needed for nausea or vomiting. 05/28/19   Ignacia Palma, MD    Allergies    Patient has no known allergies.  Review of Systems   Review of Systems  All other systems reviewed and are negative.   Physical Exam Updated Vital Signs BP (!) 176/90   Pulse (!) 115   Temp 98.3 F (36.8 C) (Oral)   Resp (!) 33   SpO2 97%   Physical Exam Vitals and nursing note reviewed.  Constitutional:      General: He is not in acute distress.    Appearance: He is well-developed.  HENT:     Head: Normocephalic and atraumatic.  Eyes:     Conjunctiva/sclera:  Conjunctivae normal.     Pupils: Pupils are equal, round, and reactive to light.  Cardiovascular:     Rate and Rhythm: Normal rate and regular rhythm.     Heart sounds: Normal heart sounds.  Pulmonary:     Effort: Pulmonary effort is normal. No respiratory distress.     Breath sounds: Normal breath sounds.  Abdominal:     General: There is no distension.     Palpations: Abdomen is soft.     Tenderness: There is no abdominal tenderness.  Musculoskeletal:        General: No deformity. Normal range of motion.     Cervical back: Normal range of motion and neck supple.  Skin:    General: Skin is warm and dry.  Neurological:     General: No focal deficit present.     Mental Status: He is alert and oriented  to person, place, and time. Mental status is at baseline.     Cranial Nerves: No cranial nerve deficit.     Sensory: No sensory deficit.     Motor: No weakness.     Coordination: Coordination normal.     Gait: Gait normal.     Deep Tendon Reflexes: Reflexes normal.     ED Results / Procedures / Treatments   Labs (all labs ordered are listed, but only abnormal results are displayed) Labs Reviewed  BASIC METABOLIC PANEL - Abnormal; Notable for the following components:      Result Value   Chloride 97 (*)    CO2 20 (*)    Glucose, Bld 124 (*)    Anion gap 20 (*)    All other components within normal limits  CBC - Abnormal; Notable for the following components:   WBC 13.8 (*)    All other components within normal limits  CBG MONITORING, ED - Abnormal; Notable for the following components:   Glucose-Capillary 124 (*)    All other components within normal limits    EKG None  Radiology CT Head Wo Contrast  Result Date: 10/28/2019 CLINICAL DATA:  Seizure EXAM: CT HEAD WITHOUT CONTRAST TECHNIQUE: Contiguous axial images were obtained from the base of the skull through the vertex without intravenous contrast. COMPARISON:  05/28/2019 FINDINGS: Brain: No evidence of acute infarction, hemorrhage, hydrocephalus, extra-axial collection or mass lesion/mass effect. 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 Vascular: No hyperdense vessel or unexpected calcification. Skull: Normal. Negative for fracture or focal lesion. Sinuses/Orbits: No acute finding. Other: None. IMPRESSION: 1.  No acute intracranial pathology. 2. 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 Electronically Signed   By: Lauralyn Primes M.D.   On: 10/28/2019 13:31    Procedures Procedures (including critical care time)  Medications Ordered in ED Medications  LORazepam (ATIVAN)  injection 1 mg (1 mg Intravenous Given 10/28/19 1259)  levETIRAcetam (KEPPRA) IVPB 1000 mg/100 mL premix (0 mg Intravenous Stopped 10/28/19 1357)    ED Course  I have reviewed the triage vital signs and the nursing notes.  Pertinent labs & imaging results that were available during my care of the patient were reviewed by me and considered in my medical decision making (see chart for details).    MDM Rules/Calculators/A&P                      MDM  Screen complete  Lucas Williams was evaluated in Emergency Department on  10/28/2019 for the symptoms described in the history of present illness. He was evaluated in the context of the global COVID-19 pandemic, which necessitated consideration that the patient might be at risk for infection with the SARS-CoV-2 virus that causes COVID-19. Institutional protocols and algorithms that pertain to the evaluation of patients at risk for COVID-19 are in a state of rapid change based on information released by regulatory bodies including the CDC and federal and state organizations. These policies and algorithms were followed during the patient's care in the ED.  Patient with longstanding history of seizure presents after observed seizure-like activity.  Screening evaluation and work-up in the ED does not reveal significant abnormality.  Patient feels improved following his ED evaluation and desires discharge.  He is given a prescription for Keppra which he states that he should be able to fill.  Importance of close follow-up is stressed.  Strict return precautions given and understood.   Final Clinical Impression(s) / ED Diagnoses Final diagnoses:  Seizure [facility])    Rx / DC Orders ED Discharge Orders         Ordered    levETIRAcetam (KEPPRA) 500 MG tablet  2 times daily     10/28/19 1408           Valarie Merino, MD 10/28/19 1431

## 2019-10-28 NOTE — Discharge Instructions (Signed)
Please return for any problem.  Follow-up with your regular care provider as instructed. °

## 2019-10-28 NOTE — ED Triage Notes (Addendum)
Patient arrived via [facility] from [facility] side walk.  Patient postictal on arrival and combative with ems.  Patient is now calm.  Bystander saw patient have a seizure. Patient fell off a bench onto pavement.   Bleeding in mouth from biting tongue per ems   C-collar placed on patient by ems for falling off bench  Patient is finally able to start answering questions per ems  No neuro defects at this time per ems Patient is still confused, unsure what baseline is for patient per ems.    EMS did not need to given anything IV Patient has not had a seizure with ems   Hx. TBI  HR-140 BP-160/100 CBG-104 98.9 oral temp 96% on RA patient placed on 4 liters

## 2019-10-28 NOTE — ED Notes (Addendum)
Spoke to patients sister and updated on discharge instructions. Sister made aware to come pick patient up. Sister will be here in 20 minutes.   Sister states patient has been binge drinkingX1 week. EDP made aware.

## 2019-10-29 MED FILL — levETIRAcetam 500 MG TABS: 500 | 30 days supply | Qty: 60 | Fill #0

## 2019-11-06 ENCOUNTER — Encounter: Payer: Self-pay | Admitting: Neurology

## 2019-11-06 ENCOUNTER — Ambulatory Visit (INDEPENDENT_AMBULATORY_CARE_PROVIDER_SITE_OTHER): Payer: Self-pay | Admitting: Neurology

## 2019-11-06 ENCOUNTER — Other Ambulatory Visit: Payer: Self-pay

## 2019-11-06 VITALS — BP 141/84 | HR 70 | Temp 98.3°F | Ht 72.0 in | Wt 239.0 lb

## 2019-11-06 DIAGNOSIS — G40209 Localization-related (focal) (partial) symptomatic epilepsy and epileptic syndromes with complex partial seizures, not intractable, without status epilepticus: Secondary | ICD-10-CM

## 2019-11-06 MED ORDER — LEVETIRACETAM 500 MG PO TABS: 500.0000 mg | ORAL_TABLET | Freq: Two times a day (BID) | ORAL | 11 refills | Status: DC

## 2019-11-06 MED ORDER — LEVETIRACETAM 500 MG PO TABS: 500.0000 mg | ORAL_TABLET | Freq: Two times a day (BID) | ORAL | 4 refills | Status: DC

## 2019-11-06 NOTE — Progress Notes (Signed)
[facility]    Provider:  Dr Lucia Gaskins Requesting Provider: No ref. provider found Primary Care Provider:  Patient, No Pcp Per  CC: Seizures  HPI:  Lucas Williams is a 44 y.o. male here as requested by No ref. provider found for seizures.  Patient's been to the emergency room twice, reviewed records, the first time was May 30, 2019 was in the setting of dehydration and illness, the second time he went in November he had missed multiple doses and again he was prescribed and encouraged to take the Keppra, he has had imaging including CT of the head.. Today he is here alone.  He reports am PMHx TBI and alcohol use but now he only has 3 beers a week. He has head trauma 3-4 years ago but didn't star having seizures until 6 months ago. No inciting events. He work's at [employer], he is uninsured, he reports he had a seizure, complete loss of consciousness, when he woke up he was told by an EMT that he had a seizure. He was on the floor making noises, he does not know what happened he was told he moved and he was making a lot of noises, bit tongue very badly, brought him tot he ED 05/2019  it was in the setting of nausea, vomiting, diarrhea. He was given a prescription for Keppra but he did not take it, he reports he could not afford it it was several $100 at the pharmacy and he did not take it. In November had another seizure, went tot he ED, he had missed a dose per ED report but patient states today he may not have even started taking it so unclear, similar loss of consciousness, generalized body shaking. He had a seizure last Sunday, has not been taking the Keppra beause of financial reasons, he has been only taking 250mg  a day. He is uninsured.  He denies any significant alcohol use.  He denies any inciting events to the seizures he does not know why this is happening, he denies any drug use, he did have head injury several years ago but unclear why he started having seizures 6 months  ago, he does endorse tongue biting, the seizures last a few minutes and that he is postictal afterwards, no history of seizures in his past, no family history of seizures, no weakness, no focal deficits.No other focal neurologic deficits, associated symptoms, inciting events or modifiable factors.  Reviewed notes, labs and imaging from outside physicians, which showed:  Personally reviewed CT of the head October 28, 2019, May 28, 2019, agree with the following  2. 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  08/05/2019 alcohol was elevated at 21 10/28/2019 CBC showed elevated white blood cells, BMP showed decreased chloride, decreased CO2, elevated glucose, increased anion gap May 28, 2019 rapid urine drug screen was negative May 28, 2019 lactic acid initially elevated 2.4 then normalized to 1.4 August 27, 2019 ethanol normal  Review of Systems: Patient complains of symptoms per HPI as well as the following symptoms: Seizures. Pertinent negatives and positives per HPI. All others negative.   Social History   Socioeconomic History  . Marital status: Single    Spouse name: Not on file  . Number of children: 2  . Years of education: Not on file  . Highest education level: High school graduate  Occupational History  . Not on file  Tobacco Use  . Smoking status: Current Every Day Smoker  Packs/day: 0.50  . Smokeless tobacco: Never Used  . Tobacco comment: trying to decrease  Substance and Sexual Activity  . Alcohol use: Yes    Alcohol/week: 6.0 standard drinks    Types: 4 Cans of beer, 2 Shots of liquor per week    Comment: weekly; update 11/06/19 pt states he cut down on the amount he drinks  . Drug use: No  . Sexual activity: Not Currently  Other Topics Concern  . Not on file  Social History Narrative   Lives with his sister   Right handed   Caffeine: mtn dew about a bottle a day, maybe  1 cup of coffee daily   Drinks lots of water   Social Determinants of Health   Financial Resource Strain:   . Difficulty of Paying Living Expenses: Not on file  Food Insecurity:   . Worried About Programme researcher, broadcasting/film/video in the Last Year: Not on file  . Ran Out of Food in the Last Year: Not on file  Transportation Needs:   . Lack of Transportation (Medical): Not on file  . Lack of Transportation (Non-Medical): Not on file  Physical Activity:   . Days of Exercise per Week: Not on file  . Minutes of Exercise per Session: Not on file  Stress:   . Feeling of Stress : Not on file  Social Connections:   . Frequency of Communication with Friends and Family: Not on file  . Frequency of Social Gatherings with Friends and Family: Not on file  . Attends Religious Services: Not on file  . Active Member of Clubs or Organizations: Not on file  . Attends Banker Meetings: Not on file  . Marital Status: Not on file  Intimate Partner Violence:   . Fear of Current or Ex-Partner: Not on file  . Emotionally Abused: Not on file  . Physically Abused: Not on file  . Sexually Abused: Not on file    Family History  Problem Relation Age of Onset  . Seizures Neg Hx     Past Medical History:  Diagnosis Date  . Laceration of hand, left     Patient Active Problem List   Diagnosis Date Noted  . 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 Alcohol abuse 02/11/2017  . 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 Pulled muscle 02/11/2017  . TBI (traumatic brain injury) (HCC) 01/29/2017    Past Surgical History:  Procedure Laterality Date  . FINGER SURGERY     r/t staph infection I&D, hand lac cutting trees  . HERNIA REPAIR     LT ihr  . NERVE REPAIR Left 12/11/2012   Procedure: Left Hand Exploration Wound with Nerve Repair;  Surgeon: Tami Ribas, MD;  Location: [facility];  Service: Orthopedics;  Laterality: Left;    Current  Outpatient Medications  Medication Sig Dispense Refill  . ibuprofen (ADVIL) 600 MG tablet Take 1 tablet (600 mg total) by mouth every 6 (six) hours as needed. 30 tablet 0  . ondansetron (ZOFRAN ODT) 4 MG disintegrating tablet Take 1 tablet (4 mg total) by mouth every 8 (eight) hours as needed for nausea or vomiting. 20 tablet 0  . levETIRAcetam (KEPPRA) 500 MG tablet Take 1 tablet (500 mg total) by mouth 2 (two) times daily. 180 tablet 4   No current facility-administered medications for this visit.    Allergies as of 11/06/2019  . (No Known Allergies)    Vitals: BP Marland Kitchen)  141/84 (BP Location: Right Arm, Patient Position: Sitting)   Pulse 70   Temp 98.3 F (36.8 C) Comment: taken at front  Ht 6' (1.829 m)   Wt 239 lb (108.4 kg)   BMI 32.41 kg/m  Last Weight:  Wt Readings from Last 1 Encounters:  11/06/19 239 lb (108.4 kg)   Last Height:   Ht Readings from Last 1 Encounters:  11/06/19 6' (1.829 m)     Physical exam: Exam: Gen: NAD, conversant, well nourised, obese, well groomed                     CV: RRR, no MRG. No Carotid Bruits. No peripheral edema, warm, nontender Eyes: Conjunctivae clear without exudates or hemorrhage  Neuro: Detailed Neurologic Exam  Speech:    Speech is normal; fluent and spontaneous with normal comprehension.  Cognition:    The patient is oriented to person, place, and time;     recent and remote memory intact;     language fluent;     normal attention, concentration,     fund of knowledge Cranial Nerves:    The pupils are equal, round, and reactive to light. The fundi are normal and spontaneous venous pulsations are present. Visual fields are full to finger confrontation. Extraocular movements are intact. Trigeminal sensation is intact and the muscles of mastication are normal. The face is symmetric. The palate elevates in the midline. Hearing intact. Voice is normal. Shoulder shrug is normal. The tongue has normal motion without  fasciculations.   Coordination:    Normal finger to nose and heel to shin. Normal rapid alternating movements.   Gait:    Heel-toe and tandem gait are normal.   Motor Observation:    No asymmetry, no atrophy, and no involuntary movements noted. Tone:    Normal muscle tone.    Posture:    Posture is normal. normal erect    Strength:    Strength is V/V in the upper and lower limbs.      Sensation: intact to LT     Reflex Exam:  DTR's:    Deep tendon reflexes in the upper and lower extremities are normal bilaterally.   Toes:    The toes are downgoing bilaterally.   Clonus:    Clonus is absent.    Assessment/Plan: This is a 44 year old male with a history of traumatic brain injury and alcohol use with multiple seizures.  He has a history of traumatic brain injury and the CT of his head does show encephalomalacia which can be seizure focus.  He denies any significant alcohol use however his ethanol was elevated on one occasion in the emergency room for seizure.  Patient bit his tongue, was postictal, labs did show consistently with seizure including elevated lactic acid, elevated white blood cells.  Patient appears to be having focal onset seizures likely due to encephalomalacia from traumatic brain injury however I am just not sure how much alcohol is involved, he states he only has 3-6 beers a week his EtOH was 21 in November at the last time he had a seizure.  Unfortunately he is uninsured and he has not been taking his medication, today I gave him money to go pick up the prescription, we gave him all the information for [facility] financial assistance, I provided him with written prescriptions and coupons from [facility] where he can get the prescription of Keppra for approximately $10 or less.  I did discuss no driving, seizure precautions, and  advised him to get his applications filled out so he can get [facility] financial assistance as we will likely need MRI of the brain with and without  contrast, EEG to complete the work-up.  I highly encouraged him to have compliant with his medication, if he runs into any problems he can always reach out to [place] and we can help him get his medications please do not go without medications, missed doses, or decrease your dose because you do not have the money. He also needs a pcp, hopefully if his financial assistance is approved we can get him the care he needs.  Discussed: (he is not driving)  Per [facility] statutes, patients with seizures are not allowed to drive until they have been seizure-free for six months.    Use caution when using heavy equipment or power tools. Avoid working on ladders or at heights. Take showers instead of baths. Ensure the water temperature is not too high on the home water heater. Do not go swimming alone. Do not lock yourself in a room alone (i.e. bathroom). When caring for infants or small children, sit down when holding, feeding, or changing them to minimize risk of injury to the child in the event you have a seizure. Maintain good sleep hygiene. Avoid alcohol.    If patient has another seizure, call 911 and bring them back to the ED if: A.  The seizure lasts longer than 5 minutes.      B.  The patient doesn't wake shortly after the seizure or has new problems such as difficulty seeing, speaking or moving following the seizure C.  The patient was injured during the seizure D.  The patient has a temperature over 102 F (39C) E.  The patient vomited during the seizure and now is having trouble breathing  Per [facility] statutes, patients with seizures are not allowed to drive until they have been seizure-free for six months.  Other recommendations include using caution when using heavy equipment or power tools. Avoid working on ladders or at heights. Take showers instead of baths.  Do not swim alone.  Ensure the water temperature is not too high on the home water heater. Do not go swimming alone. Do not  lock yourself in a room alone (i.e. bathroom). When caring for infants or small children, sit down when holding, feeding, or changing them to minimize risk of injury to the child in the event you have a seizure. Maintain good sleep hygiene. Avoid alcohol.  Also recommend adequate sleep, hydration, good diet and minimize stress.  During the Seizure  - First, ensure adequate ventilation and place patients on the floor on their left side  Loosen clothing around the neck and ensure the airway is patent. If the patient is clenching the teeth, do not force the mouth open with any object as this can cause severe damage - Remove all items from the surrounding that can be hazardous. The patient may be oblivious to what's happening and may not even know what he or she is doing. If the patient is confused and wandering, either gently guide him/her away and block access to outside areas - Reassure the individual and be comforting - Call 911. In most cases, the seizure ends before EMS arrives. However, there are cases when seizures may last over 3 to 5 minutes. Or the individual may have developed breathing difficulties or severe injuries. If a pregnant patient or a person with diabetes develops a seizure, it is prudent  to call an ambulance. - Finally, if the patient does not regain full consciousness, then call EMS. Most patients will remain confused for about 45 to 90 minutes after a seizure, so you must use judgment in calling for help. - Avoid restraints but make sure the patient is in a bed with padded side rails - Place the individual in a lateral position with the neck slightly flexed; this will help the saliva drain from the mouth and prevent the tongue from falling backward - Remove all nearby furniture and other hazards from the area - Provide verbal assurance as the individual is regaining consciousness - Provide the patient with privacy if possible - Call for help and start treatment as ordered by the  caregiver   fter the Seizure (Postictal Stage)  After a seizure, most patients experience confusion, fatigue, muscle pain and/or a headache. Thus, one should permit the individual to sleep. For the next few days, reassurance is essential. Being calm and helping reorient the person is also of importance.  Most seizures are painless and end spontaneously. Seizures are not harmful to others but can lead to complications such as stress on the lungs, brain and the heart. Individuals with prior lung problems may develop labored breathing and respiratory distress.   Discussed: Discussed Patients with epilepsy have a small risk of sudden unexpected death, a condition referred to as sudden unexpected death in epilepsy (SUDEP). SUDEP is defined specifically as the sudden, unexpected, witnessed or unwitnessed, nontraumatic and nondrowning death in patients with epilepsy with or without evidence for a seizure, and excluding documented status epilepticus, in which post mortem examination does not reveal a structural or toxicologic cause for death     Meds ordered this encounter  Medications  . DISCONTD: levETIRAcetam (KEPPRA) 500 MG tablet    Sig: Take 1 tablet (500 mg total) by mouth 2 (two) times daily.    Dispense:  60 tablet    Refill:  11  . levETIRAcetam (KEPPRA) 500 MG tablet    Sig: Take 1 tablet (500 mg total) by mouth 2 (two) times daily.    Dispense:  180 tablet    Refill:  4    A total of 60 minutes was spent on this patient's care face-to-face also reviewing imaging, past records, recent hospitalization or office notes and results. Over half this time was spent on counseling patient on the  1. Focal epilepsy with impairment of consciousness (HCC)    diagnosis and different diagnostic and therapeutic options, counseling and coordination of care, risks and benefitsof management, compliance, or risk factor reduction and education.     Naomie Dean, MD  [facility] [address] [place], [place] [address]  Phone [phone] Fax [phone]

## 2019-11-06 NOTE — Patient Instructions (Addendum)
Keppra 500mg  twice daily [facility] financial assistance You will need further workup please email me or call when you get financial assistance to schedule (MRI brain and EEG)   Per [facility] statutes, patients with seizures are not allowed to drive until they have been seizure-free for six months.    Use caution when using heavy equipment or power tools. Avoid working on ladders or at heights. Take showers instead of baths. Ensure the water temperature is not too high on the home water heater. Do not go swimming alone. Do not lock yourself in a room alone (i.e. bathroom). When caring for infants or small children, sit down when holding, feeding, or changing them to minimize risk of injury to the child in the event you have a seizure. Maintain good sleep hygiene. Avoid alcohol.    If patient has another seizure, call 911 and bring them back to the ED if: A.  The seizure lasts longer than 5 minutes.      B.  The patient doesn't wake shortly after the seizure or has new problems such as difficulty seeing, speaking or moving following the seizure C.  The patient was injured during the seizure D.  The patient has a temperature over 102 F (39C) E.  The patient vomited during the seizure and now is having trouble breathing  Per [facility] statutes, patients with seizures are not allowed to drive until they have been seizure-free for six months.  Other recommendations include using caution when using heavy equipment or power tools. Avoid working on ladders or at heights. Take showers instead of baths.  Do not swim alone.  Ensure the water temperature is not too high on the home water heater. Do not go swimming alone. Do not lock yourself in a room alone (i.e. bathroom). When caring for infants or small children, sit down when holding, feeding, or changing them to minimize risk of injury to the child in the event you have a seizure. Maintain good sleep hygiene. Avoid alcohol.  Also recommend  adequate sleep, hydration, good diet and minimize stress.  During the Seizure  - First, ensure adequate ventilation and place patients on the floor on their left side  Loosen clothing around the neck and ensure the airway is patent. If the patient is clenching the teeth, do not force the mouth open with any object as this can cause severe damage - Remove all items from the surrounding that can be hazardous. The patient may be oblivious to what's happening and may not even know what he or she is doing. If the patient is confused and wandering, either gently guide him/her away and block access to outside areas - Reassure the individual and be comforting - Call 911. In most cases, the seizure ends before EMS arrives. However, there are cases when seizures may last over 3 to 5 minutes. Or the individual may have developed breathing difficulties or severe injuries. If a pregnant patient or a person with diabetes develops a seizure, it is prudent to call an ambulance. - Finally, if the patient does not regain full consciousness, then call EMS. Most patients will remain confused for about 45 to 90 minutes after a seizure, so you must use judgment in calling for help. - Avoid restraints but make sure the patient is in a bed with padded side rails - Place the individual in a lateral position with the neck slightly flexed; this will help the saliva drain from the mouth and prevent the tongue  from falling backward - Remove all nearby furniture and other hazards from the area - Provide verbal assurance as the individual is regaining consciousness - Provide the patient with privacy if possible - Call for help and start treatment as ordered by the caregiver   fter the Seizure (Postictal Stage)  After a seizure, most patients experience confusion, fatigue, muscle pain and/or a headache. Thus, one should permit the individual to sleep. For the next few days, reassurance is essential. Being calm and helping  reorient the person is also of importance.  Most seizures are painless and end spontaneously. Seizures are not harmful to others but can lead to complications such as stress on the lungs, brain and the heart. Individuals with prior lung problems may develop labored breathing and respiratory distress.  Epilepsy Epilepsy is when a person keeps having seizures. A seizure is a burst of abnormal activity in the brain. A seizure can change how you think or behave, and it can make it hard to be aware of what is happening. This condition can cause problems such as:  Falls, accidents, and injury.  Sadness (depression).  Poor memory.  Sudden unexplained death in epilepsy (SUDEP). This is rare. Its cause is not known. Most people with epilepsy lead normal lives. What are the causes? This condition may be caused by:  A head injury.  An injury that happens at birth.  A high fever during childhood.  A stroke.  Bleeding that goes into or around the brain.  Certain medicines and drugs.  Having too little oxygen for a long period of time.  Abnormal brain development.  Certain infections.  Brain tumors.  Conditions that are passed from parent to child (are hereditary). What are the signs or symptoms? Symptoms of a seizure vary from person to person. They may include:  Jerky movements of muscles (convulsions).  Stiffening of the body.  Movements of the arms or legs that you are not able to control.  Passing out (loss of consciousness).  Breathing problems.  Sudden falls.  Confusion.  Head nodding.  Eye blinking or twitching.  Lip smacking.  Drooling.  Fast eye movements.  Grunting.  Not being able to control when you pee or poop.  Staring.  Being hard to wake up (unresponsiveness). Some people have symptoms right before a seizure happens (aura) and right after a seizure happens. These symptoms include:  Fear or anxiety.  Feeling sick to your stomach  (nauseous).  Feeling like the room is spinning (vertigo).  A feeling of having seen or heard something before (dj vu).  Odd tastes or smells.  Changes in how you see (vision), such as seeing flashing lights or spots. Symptoms that follow a seizure include:  Being confused.  Being sleepy.  Having a headache. How is this treated? Treatment can control seizures. Treatment for this condition may involve:  Taking medicines to control seizures.  Having a device (vagus nerve stimulator) put in the chest. The device sends signals to a nerve and to the brain to prevent seizures.  Brain surgery to stop seizures from happening or to reduce how often they happen.  Having blood tests often to make sure you are getting the right amount of medicine. Once this condition has been diagnosed, it is important to start treatment as soon as possible. For some people, epilepsy goes away in time. Others will need treatment for the rest of their life. Follow these instructions at home: Medicines  Take over-the-counter and prescription medicines only as told  by your doctor.  Avoid anything that may keep your medicine from working, such as alcohol. Activity  Get enough rest. Lack of sleep can make seizures more likely to occur.  Follow your doctor's advice about driving, swimming, and doing anything else that would be dangerous if you had a seizure. ? If you live in the [place] check with your local DMV (department of motor vehicles) to find out about local driving laws. Each state has rules about when you can return to driving. Teaching others Teach friends and family what to do if you have a seizure. They should:  Lay you on the ground to prevent a fall.  Cushion your head and body.  Loosen any tight clothing around your neck.  Turn you on your side.  Stay with you until you are better.  Not hold you down.  Not put anything in your mouth.  Know whether or not you need emergency  care.  General instructions  Avoid anything that causes you to have seizures.  Keep a seizure diary. Write down what you remember about each seizure. Be sure to include what might have caused it.  Keep all follow-up visits as told by your doctor. This is important. Contact a doctor if:  You have a change in how often or when you have seizures.  You get an infection or start to feel sick. You may have more seizures when you are sick. Get help right away if:  A seizure does not stop after 5 minutes.  You have more than one seizure in a row, and you do not have enough time between the seizures to feel better.  A seizure makes it harder to breathe.  A seizure is different from other seizures you have had.  A seizure makes you unable to speak or use a part of your body.  You did not wake up right after a seizure. These symptoms may be an emergency. Do not wait to see if the symptoms will go away. Get medical help right away. Call your local emergency services (911 in the [place] Do not drive yourself to the hospital. Summary  Epilepsy is when a person keeps having seizures. A seizure is a burst of abnormal activity in the brain.  Treatment can control seizures.  Teach friends and family what to do if you have a seizure. This information is not intended to replace advice given to you by your health care provider. Make sure you discuss any questions you have with your health care provider. Document Revised: 05/01/2018 Document Reviewed: 05/01/2018 Elsevier Patient Education  2020 Elsevier Inc.  Levetiracetam tablets What is this medicine? LEVETIRACETAM (lee ve tye RA se tam) is an antiepileptic drug. It is used with other medicines to treat certain types of seizures. This medicine may be used for other purposes; ask your health care provider or pharmacist if you have questions. COMMON BRAND NAME(S): Keppra, Roweepra What should I tell my health care provider before I take this  medicine? They need to know if you have any of these conditions:  kidney disease  suicidal thoughts, plans, or attempt; a previous suicide attempt by you or a family member  an unusual or allergic reaction to levetiracetam, other medicines, foods, dyes, or preservatives  pregnant or trying to get pregnant  breast-feeding How should I use this medicine? Take this medicine by mouth with a glass of water. Follow the directions on the prescription label. Swallow the tablets whole. Do not crush or chew this medicine.  You may take this medicine with or without food. Take your doses at regular intervals. Do not take your medicine more often than directed. Do not stop taking this medicine or any of your seizure medicines unless instructed by your doctor or health care professional. Stopping your medicine suddenly can increase your seizures or their severity. A special MedGuide will be given to you by the pharmacist with each prescription and refill. Be sure to read this information carefully each time. Contact your pediatrician or health care professional regarding the use of this medication in children. While this drug may be prescribed for children as young as 78 years of age for selected conditions, precautions do apply. Overdosage: If you think you have taken too much of this medicine contact a poison control center or emergency room at once. NOTE: This medicine is only for you. Do not share this medicine with others. What if I miss a dose? If you miss a dose, take it as soon as you can. If it is almost time for your next dose, take only that dose. Do not take double or extra doses. What may interact with this medicine? This medicine may interact with the following medications:  carbamazepine  colesevelam  probenecid  sevelamer This list may not describe all possible interactions. Give your health care provider a list of all the medicines, herbs, non-prescription drugs, or dietary  supplements you use. Also tell them if you smoke, drink alcohol, or use illegal drugs. Some items may interact with your medicine. What should I watch for while using this medicine? Visit your doctor or health care provider for a regular check on your progress. Wear a medical identification bracelet or chain to say you have epilepsy, and carry a card that lists all your medications. This medicine may cause serious skin reactions. They can happen weeks to months after starting the medicine. Contact your health care provider right away if you notice fevers or flu-like symptoms with a rash. The rash may be red or purple and then turn into blisters or peeling of the skin. Or, you might notice a red rash with swelling of the face, lips or lymph nodes in your neck or under your arms. It is important to take this medicine exactly as instructed by your health care provider. When first starting treatment, your dose may need to be adjusted. It may take weeks or months before your dose is stable. You should contact your doctor or health care provider if your seizures get worse or if you have any new types of seizures. You may get drowsy or dizzy. Do not drive, use machinery, or do anything that needs mental alertness until you know how this medicine affects you. Do not stand or sit up quickly, especially if you are an older patient. This reduces the risk of dizzy or fainting spells. Alcohol may interfere with the effect of this medicine. Avoid alcoholic drinks. The use of this medicine may increase the chance of suicidal thoughts or actions. Pay special attention to how you are responding while on this medicine. Any worsening of mood, or thoughts of suicide or dying should be reported to your health care provider right away. Women who become pregnant while using this medicine may enroll in the [place] American Antiepileptic Drug Pregnancy Registry by calling [phone]. This registry collects information about the  safety of antiepileptic drug use during pregnancy. What side effects may I notice from receiving this medicine? Side effects that you should report to your doctor or  health care professional as soon as possible:  allergic reactions like skin rash, itching or hives, swelling of the face, lips, or tongue  breathing problems  dark urine  general ill feeling or flu-like symptoms  problems with balance, talking, walking  rash, fever, and swollen lymph nodes  redness, blistering, peeling or loosening of the skin, including inside the mouth  unusually weak or tired  worsening of mood, thoughts or actions of suicide or dying  yellowing of the eyes or skin Side effects that usually do not require medical attention (report to your doctor or health care professional if they continue or are bothersome):  diarrhea  dizzy, drowsy  headache  loss of appetite This list may not describe all possible side effects. Call your doctor for medical advice about side effects. You may report side effects to FDA at 1-800-FDA-1088. Where should I keep my medicine? Keep out of reach of children. Store at room temperature between 15 and 30 degrees C (59 and 86 degrees F). Throw away any unused medicine after the expiration date. NOTE: This sheet is a summary. It may not cover all possible information. If you have questions about this medicine, talk to your doctor, pharmacist, or health care provider.  2020 Elsevier/Gold Standard (2018-12-08 15:23:36)

## 2019-11-14 ENCOUNTER — Encounter (HOSPITAL_COMMUNITY): Payer: Self-pay

## 2019-11-14 ENCOUNTER — Other Ambulatory Visit: Payer: Self-pay

## 2019-11-14 ENCOUNTER — Emergency Department (HOSPITAL_COMMUNITY)
Admission: EM | Admit: 2019-11-14 | Discharge: 2019-11-15 | Disposition: A | Discharge status: Home / Self Care | Payer: Self-pay | Attending: Emergency Medicine | Admitting: Emergency Medicine

## 2019-11-14 DIAGNOSIS — Z20822 Contact with and (suspected) exposure to covid-19: Secondary | ICD-10-CM | POA: Insufficient documentation

## 2019-11-14 DIAGNOSIS — F1010 Alcohol abuse, uncomplicated: Secondary | ICD-10-CM | POA: Diagnosis present

## 2019-11-14 DIAGNOSIS — F1994 Other psychoactive substance use, unspecified with psychoactive substance-induced mood disorder: Secondary | ICD-10-CM

## 2019-11-14 DIAGNOSIS — R45851 Suicidal ideations: Secondary | ICD-10-CM | POA: Insufficient documentation

## 2019-11-14 DIAGNOSIS — Y908 Blood alcohol level of 240 mg/100 ml or more: Secondary | ICD-10-CM | POA: Insufficient documentation

## 2019-11-14 DIAGNOSIS — F17200 Nicotine dependence, unspecified, uncomplicated: Secondary | ICD-10-CM | POA: Insufficient documentation

## 2019-11-14 DIAGNOSIS — F10288 Alcohol dependence with other alcohol-induced disorder: Secondary | ICD-10-CM | POA: Insufficient documentation

## 2019-11-14 LAB — COMPREHENSIVE METABOLIC PANEL
ALT: 158 U/L — ABNORMAL HIGH (ref 0–44)
AST: 99 U/L — ABNORMAL HIGH (ref 15–41)
Albumin: 4.2 g/dL (ref 3.5–5.0)
Alkaline Phosphatase: 100 U/L (ref 38–126)
Anion gap: 10 (ref 5–15)
BUN: 16 mg/dL (ref 6–20)
CO2: 22 mmol/L (ref 22–32)
Calcium: 8.7 mg/dL — ABNORMAL LOW (ref 8.9–10.3)
Chloride: 112 mmol/L — ABNORMAL HIGH (ref 98–111)
Creatinine, Ser: 0.99 mg/dL (ref 0.61–1.24)
GFR calc Af Amer: >60 mL/min (ref >60)
GFR calc non Af Amer: >60 mL/min (ref >60)
Glucose, Bld: 112 mg/dL — ABNORMAL HIGH (ref 70–99)
Potassium: 4.5 mmol/L (ref 3.5–5.1)
Sodium: 144 mmol/L (ref 135–145)
Total Bilirubin: 0.5 mg/dL (ref 0.3–1.2)
Total Protein: 8.1 g/dL (ref 6.5–8.1)

## 2019-11-14 LAB — ETHANOL: Alcohol, Ethyl (B): 307 mg/dL (ref <10)

## 2019-11-14 LAB — CBC
HCT: 46.5 % (ref 39.0–52.0)
Hemoglobin: 15.3 g/dL (ref 13.0–17.0)
MCH: 31 pg (ref 26.0–34.0)
MCHC: 32.9 g/dL (ref 30.0–36.0)
MCV: 94.1 fL (ref 80.0–100.0)
Platelets: 259 10*3/uL (ref 150–400)
RBC: 4.94 MIL/uL (ref 4.22–5.81)
RDW: 13.5 % (ref 11.5–15.5)
WBC: 7.9 10*3/uL (ref 4.0–10.5)
nRBC: 0 % (ref 0.0–0.2)

## 2019-11-14 LAB — RAPID URINE DRUG SCREEN, HOSP PERFORMED
Amphetamines: NOT DETECTED
Barbiturates: NOT DETECTED
Benzodiazepines: NOT DETECTED
Cocaine: NOT DETECTED
Opiates: NOT DETECTED
Tetrahydrocannabinol: NOT DETECTED

## 2019-11-14 LAB — SALICYLATE LEVEL: Salicylate Lvl: <7 mg/dL — ABNORMAL LOW (ref 7.0–30.0)

## 2019-11-14 LAB — ACETAMINOPHEN LEVEL: Acetaminophen (Tylenol), Serum: <10 ug/mL — ABNORMAL LOW (ref 10–30)

## 2019-11-14 MED ORDER — LORAZEPAM 2 MG/ML IJ SOLN: 0.0000 mg | Freq: Two times a day (BID) | INTRAMUSCULAR | Status: DC

## 2019-11-14 MED ORDER — LORAZEPAM 2 MG/ML IJ SOLN: 0.0000 mg | Freq: Four times a day (QID) | INTRAMUSCULAR | Status: DC

## 2019-11-14 MED ORDER — LORAZEPAM 1 MG PO TABS
0.0000 mg | ORAL_TABLET | Freq: Four times a day (QID) | ORAL | Status: DC
  Administered 2019-11-15: 1 mg via ORAL
  Filled 2019-11-14 (×2): qty 1

## 2019-11-14 MED ORDER — THIAMINE HCL 100 MG/ML IJ SOLN: 100.0000 mg | Freq: Every day | INTRAMUSCULAR | Status: DC

## 2019-11-14 MED ORDER — IBUPROFEN 200 MG PO TABS
600.0000 mg | ORAL_TABLET | Freq: Four times a day (QID) | ORAL | Status: DC | PRN
  Administered 2019-11-14: 600 mg via ORAL
  Filled 2019-11-14: qty 3

## 2019-11-14 MED ORDER — LEVETIRACETAM 500 MG PO TABS
500.0000 mg | ORAL_TABLET | Freq: Two times a day (BID) | ORAL | Status: DC
  Administered 2019-11-14 – 2019-11-15 (×2): 500 mg via ORAL
  Filled 2019-11-14 (×2): qty 1

## 2019-11-14 MED ORDER — LORAZEPAM 1 MG PO TABS: 0.0000 mg | ORAL_TABLET | Freq: Two times a day (BID) | ORAL | Status: DC

## 2019-11-14 MED ORDER — THIAMINE HCL 100 MG PO TABS
100.0000 mg | ORAL_TABLET | Freq: Every day | ORAL | Status: DC
  Administered 2019-11-15 (×2): 100 mg via ORAL
  Filled 2019-11-14 (×2): qty 1

## 2019-11-14 NOTE — ED Notes (Signed)
TTS consult in progress. °

## 2019-11-14 NOTE — BHH Counselor (Signed)
Clinician spoke to [place], RN to follow up on pt's IVC paperwork being fax to [facility]. RN to fax IVC paperwork. Once IVC paperwork is received and reviewed pt's assessment will be completed.   Redmond Pulling, MS, [facility], [facility] Triage Specialist [phone].

## 2019-11-14 NOTE — BHH Counselor (Signed)
Spoke to [place], Charity fundraiser to fax pt's IVC paperwork. Once IVC paperwork is received and reviewed pt's assessment will be completed.     Redmond Pulling, MS, [facility], [facility] Triage Specialist [phone]

## 2019-11-14 NOTE — ED Notes (Addendum)
Sister called for an update, reassured her he is in TCU and secure, sister will call back in the morning for update on plan of care.

## 2019-11-14 NOTE — ED Provider Notes (Signed)
[facility]-EMERGENCY DEPT Provider Note   CSN: [id number] Arrival date & time: 11/14/19  2001     History Chief Complaint  Patient presents with  . IVC  . Suicidal    Lucas Williams is a 44 y.o. male.  44 year old male who presents with suicidal ideations as well as alcohol abuse.  Patient placed under IVC due to telling the petitioner he was going to go to the pharmacy and pick up his Keppra which he takes for his history of seizures and overdose on it.  Patient does admit to this.  Denies any active intentional ingestions.  Last used alcohol today and had approximately 11 drinks.        Past Medical History:  Diagnosis Date  . Laceration of hand, left     Patient Active Problem List   Diagnosis Date Noted  . 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 Alcohol abuse 02/11/2017  . 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 Pulled muscle 02/11/2017  . TBI (traumatic brain injury) (HCC) 01/29/2017    Past Surgical History:  Procedure Laterality Date  . FINGER SURGERY     r/t staph infection I&D, hand lac cutting trees  . HERNIA REPAIR     LT ihr  . NERVE REPAIR Left 12/11/2012   Procedure: Left Hand Exploration Wound with Nerve Repair;  Surgeon: Tami Ribas, MD;  Location: [facility];  Service: Orthopedics;  Laterality: Left;       Family History  Problem Relation Age of Onset  . Seizures Neg Hx     Social History   Tobacco Use  . Smoking status: Current Every Day Smoker    Packs/day: 0.50  . Smokeless tobacco: Never Used  . Tobacco comment: trying to decrease  Substance Use Topics  . Alcohol use: Yes    Alcohol/week: 6.0 standard drinks    Types: 4 Cans of beer, 2 Shots of liquor per week    Comment: weekly; update 11/06/19 pt states he cut down on the amount he drinks  . Drug use: No    Home Medications Prior to Admission medications   Medication Sig Start Date End  Date Taking? Authorizing Provider  ibuprofen (ADVIL) 600 MG tablet Take 1 tablet (600 mg total) by mouth every 6 (six) hours as needed. 10/26/19   Elpidio Anis, PA-C  levETIRAcetam (KEPPRA) 500 MG tablet Take 1 tablet (500 mg total) by mouth 2 (two) times daily. 11/06/19   Anson Fret, MD  ondansetron (ZOFRAN ODT) 4 MG disintegrating tablet Take 1 tablet (4 mg total) by mouth every 8 (eight) hours as needed for nausea or vomiting. 05/28/19   Ignacia Palma, MD    Allergies    Patient has no known allergies.  Review of Systems   Review of Systems  All other systems reviewed and are negative.   Physical Exam Updated Vital Signs BP 131/68 (BP Location: Right Arm)   Pulse (!) 118   Temp 98.2 F (36.8 C) (Oral)   Resp 16   Ht 1.829 m (6')   Wt 108.4 kg   SpO2 97%   BMI 32.41 kg/m   Physical Exam Vitals and nursing note reviewed.  Constitutional:      General: He is not in acute distress.    Appearance: Normal appearance. He is well-developed. He is not toxic-appearing.  HENT:     Head: Normocephalic and atraumatic.  Eyes:     General: Lids are  normal.     Conjunctiva/sclera: Conjunctivae normal.     Pupils: Pupils are equal, round, and reactive to light.  Neck:     Thyroid: No thyroid mass.     Trachea: No tracheal deviation.  Cardiovascular:     Rate and Rhythm: Normal rate and regular rhythm.     Heart sounds: Normal heart sounds. No murmur. No gallop.   Pulmonary:     Effort: Pulmonary effort is normal. No respiratory distress.     Breath sounds: Normal breath sounds. No stridor. No decreased breath sounds, wheezing, rhonchi or rales.  Abdominal:     General: Bowel sounds are normal. There is no distension.     Palpations: Abdomen is soft.     Tenderness: There is no abdominal tenderness. There is no rebound.  Musculoskeletal:        General: No tenderness. Normal range of motion.     Cervical back: Normal range of motion and neck supple.  Skin:     General: Skin is warm and dry.     Findings: No abrasion or rash.  Neurological:     Mental Status: He is alert and oriented to person, place, and time.     GCS: GCS eye subscore is 4. GCS verbal subscore is 5. GCS motor subscore is 6.     Cranial Nerves: No cranial nerve deficit.     Sensory: No sensory deficit.  Psychiatric:        Attention and Perception: Attention normal.        Mood and Affect: Affect is flat.        Speech: Speech is delayed.        Behavior: Behavior is withdrawn.        Thought Content: Thought content includes suicidal ideation.     ED Results / Procedures / Treatments   Labs (all labs ordered are listed, but only abnormal results are displayed) Labs Reviewed  RESPIRATORY PANEL BY RT PCR (FLU A&B, COVID)  COMPREHENSIVE METABOLIC PANEL  ETHANOL  SALICYLATE LEVEL  ACETAMINOPHEN LEVEL  CBC  RAPID URINE DRUG SCREEN, HOSP PERFORMED    EKG None  Radiology No results found.  Procedures Procedures (including critical care time)  Medications Ordered in ED Medications  levETIRAcetam (KEPPRA) tablet 500 mg (has no administration in time range)  ibuprofen (ADVIL) tablet 600 mg (has no administration in time range)    ED Course  I have reviewed the triage vital signs and the nursing notes.  Pertinent labs & imaging results that were available during my care of the patient were reviewed by me and considered in my medical decision making (see chart for details).    MDM Rules/Calculators/A&P                      Patient medically clear for psychiatric disposition Final Clinical Impression(s) / ED Diagnoses Final diagnoses:  None    Rx / DC Orders ED Discharge Orders    None       Lacretia Leigh, MD 11/14/19 2245

## 2019-11-14 NOTE — BHH Counselor (Signed)
IVC paperwork to be faxed. Once IVC paperwork is received and reviewed pt's assessment will be completed.   Fax number: [phone].   Redmond Pulling, MS, [facility], [facility] Triage Specialist [phone]

## 2019-11-14 NOTE — ED Triage Notes (Signed)
Pt BIB [facility] under IVC. Papers state "respondent suffered a traumatic brain injury that causes him to have seizures. Earlier today he called the petitioner and stated that he was going to the pharmacy to pick up his medications and then overdose on them. Respondent abuses alcohol daily."

## 2019-11-15 ENCOUNTER — Ambulatory Visit (HOSPITAL_COMMUNITY): Payer: Self-pay

## 2019-11-15 DIAGNOSIS — F1994 Other psychoactive substance use, unspecified with psychoactive substance-induced mood disorder: Secondary | ICD-10-CM

## 2019-11-15 LAB — RESPIRATORY PANEL BY RT PCR (FLU A&B, COVID)
Influenza A by PCR: NEGATIVE
Influenza B by PCR: NEGATIVE
SARS Coronavirus 2 by RT PCR: NEGATIVE

## 2019-11-15 NOTE — Patient Outreach (Signed)
CPSS met with Pt an was able to gain information to better assist Pt. CPSS was made aware that Pt does not have any concerns that he needs help with. Pt stated that he understands that he has a drinking problem but he does not want any help at this time.

## 2019-11-15 NOTE — ED Provider Notes (Signed)
44 year old male history of 2 brain injury, alcohol abuse, IVC by sister with reports that he stated he was going to overdose on his medications. TTS note reviewed and recommend patient observed and reassessed by psychiatry. Psych is seen and evaluated patient is rescinding IVC and discharging patient   Margarita Grizzle, MD 11/15/19 1154

## 2019-11-15 NOTE — ED Notes (Signed)
Lucas Williams at [facility] stated pt will be  reassessed in the mornig per Preston Fleeting, MD.

## 2019-11-15 NOTE — BH Assessment (Addendum)
BHH Assessment Progress Note  Per Ophelia Shoulder, FNP, this pt does not require psychiatric hospitalization at this time.  Pt presents under IVC with two petitions on his chart, one initiated by pt's sister and one initiated by EDP Alona Bene.  These have been rescinded by Nelly Rout, MD.  Pt is to be discharged from [facility] with recommendation to follow up with [facility] [place].  This has been included in pt's discharge instructions.  Pt would also benefit from seeing Peer Support Specialists, and a peer support consult has been ordered for pt.  Pt's nurse, Addison Naegeli, has been notified.  Doylene Canning, MA Triage Specialist [phone]
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
## 2019-11-15 NOTE — BHH Suicide Risk Assessment (Cosign Needed)
Suicide Risk Assessment  Discharge Assessment   [facility] Discharge Suicide Risk Assessment   Principal Problem: Substance induced mood disorder ([place]) Discharge Diagnoses: Principal Problem:   Substance induced mood disorder ([place]) Active Problems:   Alcohol abuse   Total Time spent with patient: 30 minutes  Musculoskeletal: Strength & Muscle Tone: within normal limits Gait & Station: normal Patient leans: N/A  Psychiatric Specialty Exam:   Blood pressure (!) 146/87, pulse 76, temperature (!) 97.5 F (36.4 C), temperature source Oral, resp. rate 16, height 6' (1.829 m), weight 108.4 kg, SpO2 95 %.Body mass index is 32.41 kg/m.  General Appearance: Casual and appears tired but otherwise NAD  Eye Contact::  Good  Speech:  Clear and Coherent and Normal Rate409  Volume:  Normal  Mood:  Euthymic and appears tired  Affect:  Appropriate and Congruent  Thought Process:  Coherent and Descriptions of Associations: Intact  Orientation:  Full (Time, Place, and Person)  Thought Content:  Logical, Rumination and states he is not sure why he is here  Suicidal Thoughts:  No  Homicidal Thoughts:  No  Memory:  Immediate;   Good Recent;   Good Remote;   Good  Judgement:  Other:  okay today but questionable in the setting of sustance abuse  Insight:  Good and acknowleges suicidal ideations associated with excessive alcohol usage  Psychomotor Activity:  Normal  Concentration:  Good  Recall:  Good  Fund of Knowledge:Good  Language: Good  Akathisia:  Negative  Handed:  Right  AIMS (if indicated):     Assets:  Desire for Improvement Resilience  Sleep:     Cognition: WNL  ADL's:  Intact   Mental Status Per Nursing Assessment::   On Admission:     Demographic Factors:  Male  Loss Factors: NA  Historical Factors: Impulsivity and related to alcohol use and TBI  Risk Reduction Factors:   Living with another person, especially a relative and Positive social support  Continued Clinical  Symptoms:  Alcohol/Substance Abuse/Dependencies Medical Diagnoses and Treatments/Surgeries TBI  Cognitive Features That Contribute To Risk:  None    Suicide Risk:  Mild:  Suicidal ideation of limited frequency, intensity, duration, and specificity.  There are no identifiable plans, no associated intent, mild dysphoria and related symptoms, good self-control (both objective and subjective assessment), few other risk factors, and identifiable protective factors, including available and accessible social support.  Plan Of Care/Follow-up recommendations:  44 year old male male patient brought to [facility] ED via IVC for evaluation of suicidal ideations with a plan to overdose on bills. His suicidal behavior is in the context of chronic alcohol abuse (historically consumed 11 drinks prior to arrival), and TBI.  On admission, his BAL was 307 and his UDS was negative. He has been evaluated by our facility before with similar presentation and alcohol withdrawal.  He has a medical hx for seizures, was seen by Dr Jaynee Eagles from [facility] follow-up 2/16 and Keppra medication compliance encouraged. He reports his last seizure was 4 days ago, and reports medication compliance.  Ironically, per IVC the patient verbalized intent to overdose on Keppra.      After resting overnight, he is more clear and coherent today; and no longer endorses suicidal ideations or plan.  He demonstrates situational insight by correlating his suicidal thoughts with his alcohol use.  He admits to chronic alcohol use and after reviewing the consequences of his continued substance abuse (disrupted family process, new or worsening medical concerns and declining  health, etc.,) he relates that he is interested in receiving help. At present he does not receive outpatient mental health care.   In the context of his substance use contributing to mood disturbance.  In the absence of acute safety concerns, recommend outpatient  care.  The patient demonstrates situational insight and agrees to accept referral resources for treatment.    Recommend discharge home.  The patient appears reasonably screened and/or stabilized for discharge and does not appear to have emergency psychiatric concerns/conditions requiring further screening, evaluation, or treatment at this time prior to discharge.    -Peer support ordered prior to discharge -SW provided information to [facility] of the [place] for outpatient services -Dr.Kumar rescinded IVC  Chales Abrahams, NP 11/15/2019, 1:50 PM

## 2019-11-15 NOTE — Discharge Instructions (Signed)
For your behavioral health needs you are advised to follow up with [facility].  New patients are seen at their walk-in clinic.  Walk-in hours are Monday - Friday from 8:30 am - 12:00 pm, and from 1:00 pm - 2:30 pm.  Walk-in patients are seen on a first come, first served basis, so try to arrive as early as possible for the best chance of being seen the same day:       [facility]      [address]      [place], [place] [address]      [phone] 

## 2019-12-31 ENCOUNTER — Emergency Department (HOSPITAL_COMMUNITY)
Admission: EM | Admit: 2019-12-31 | Discharge: 2019-12-31 | Disposition: A | Discharge status: Home / Self Care | Payer: Self-pay | Attending: Emergency Medicine | Admitting: Emergency Medicine

## 2019-12-31 ENCOUNTER — Other Ambulatory Visit: Payer: Self-pay

## 2019-12-31 ENCOUNTER — Encounter (HOSPITAL_COMMUNITY): Payer: Self-pay | Admitting: Emergency Medicine

## 2019-12-31 DIAGNOSIS — F17210 Nicotine dependence, cigarettes, uncomplicated: Secondary | ICD-10-CM | POA: Insufficient documentation

## 2019-12-31 DIAGNOSIS — R569 Unspecified convulsions: Secondary | ICD-10-CM | POA: Insufficient documentation

## 2019-12-31 DIAGNOSIS — G40909 Epilepsy, unspecified, not intractable, without status epilepticus: Secondary | ICD-10-CM | POA: Insufficient documentation

## 2019-12-31 HISTORY — DX: Unspecified convulsions: R56.9

## 2019-12-31 LAB — BLOOD GAS, VENOUS
Acid-base deficit: 21 mmol/L — ABNORMAL HIGH (ref 0.0–2.0)
Acid-base deficit: 1.6 mmol/L (ref 0.0–2.0)
Bicarbonate: 9.7 mmol/L — ABNORMAL LOW (ref 20.0–28.0)
Bicarbonate: 22.9 mmol/L (ref 20.0–28.0)
O2 Saturation: 93.5 %
O2 Saturation: 80.5 %
Patient temperature: 98.6
Patient temperature: 98.6
pCO2, Ven: 36.9 mmHg — ABNORMAL LOW (ref 44.0–60.0)
pCO2, Ven: 40 mmHg — ABNORMAL LOW (ref 44.0–60.0)
pH, Ven: 7.048 — CL (ref 7.250–7.430)
pH, Ven: 7.375 (ref 7.250–7.430)
pO2, Ven: 108 mmHg — ABNORMAL HIGH (ref 32.0–45.0)
pO2, Ven: 50.2 mmHg — ABNORMAL HIGH (ref 32.0–45.0)

## 2019-12-31 LAB — COMPREHENSIVE METABOLIC PANEL
ALT: 244 U/L — ABNORMAL HIGH (ref 0–44)
AST: 254 U/L — ABNORMAL HIGH (ref 15–41)
Albumin: 4.5 g/dL (ref 3.5–5.0)
Alkaline Phosphatase: 142 U/L — ABNORMAL HIGH (ref 38–126)
Anion gap: 28 — ABNORMAL HIGH (ref 5–15)
BUN: 14 mg/dL (ref 6–20)
CO2: 10 mmol/L — ABNORMAL LOW (ref 22–32)
Calcium: 9 mg/dL (ref 8.9–10.3)
Chloride: 106 mmol/L (ref 98–111)
Creatinine, Ser: 1.08 mg/dL (ref 0.61–1.24)
GFR calc Af Amer: >60 mL/min (ref >60)
GFR calc non Af Amer: >60 mL/min (ref >60)
Glucose, Bld: 133 mg/dL — ABNORMAL HIGH (ref 70–99)
Potassium: 3.6 mmol/L (ref 3.5–5.1)
Sodium: 144 mmol/L (ref 135–145)
Total Bilirubin: 0.8 mg/dL (ref 0.3–1.2)
Total Protein: 8.9 g/dL — ABNORMAL HIGH (ref 6.5–8.1)

## 2019-12-31 LAB — ACETAMINOPHEN LEVEL: Acetaminophen (Tylenol), Serum: <10 ug/mL — ABNORMAL LOW (ref 10–30)

## 2019-12-31 LAB — CBC WITH DIFFERENTIAL/PLATELET
Abs Immature Granulocytes: 0.07 10*3/uL (ref 0.00–0.07)
Basophils Absolute: 0.1 10*3/uL (ref 0.0–0.1)
Basophils Relative: 1 %
Eosinophils Absolute: 0.1 10*3/uL (ref 0.0–0.5)
Eosinophils Relative: 1 %
HCT: 51.6 % (ref 39.0–52.0)
Hemoglobin: 16 g/dL (ref 13.0–17.0)
Immature Granulocytes: 1 %
Lymphocytes Relative: 39 %
Lymphs Abs: 4.3 10*3/uL — ABNORMAL HIGH (ref 0.7–4.0)
MCH: 31.4 pg (ref 26.0–34.0)
MCHC: 31 g/dL (ref 30.0–36.0)
MCV: 101.4 fL — ABNORMAL HIGH (ref 80.0–100.0)
Monocytes Absolute: 1.4 10*3/uL — ABNORMAL HIGH (ref 0.1–1.0)
Monocytes Relative: 12 %
Neutro Abs: 5.2 10*3/uL (ref 1.7–7.7)
Neutrophils Relative %: 46 %
Platelets: 143 10*3/uL — ABNORMAL LOW (ref 150–400)
RBC: 5.09 MIL/uL (ref 4.22–5.81)
RDW: 14.4 % (ref 11.5–15.5)
WBC: 11.1 10*3/uL — ABNORMAL HIGH (ref 4.0–10.5)
nRBC: 0 % (ref 0.0–0.2)

## 2019-12-31 LAB — LACTIC ACID, PLASMA
Lactic Acid, Venous: >11 mmol/L (ref 0.5–1.9)
Lactic Acid, Venous: 1.4 mmol/L (ref 0.5–1.9)

## 2019-12-31 LAB — CBG MONITORING, ED: Glucose-Capillary: 123 mg/dL — ABNORMAL HIGH (ref 70–99)

## 2019-12-31 LAB — URINALYSIS, ROUTINE W REFLEX MICROSCOPIC
Bilirubin Urine: NEGATIVE
Glucose, UA: NEGATIVE mg/dL
Ketones, ur: NEGATIVE mg/dL
Leukocytes,Ua: NEGATIVE
Nitrite: NEGATIVE
Protein, ur: 100 mg/dL — AB
Specific Gravity, Urine: 1.014 (ref 1.005–1.030)
pH: 5 (ref 5.0–8.0)

## 2019-12-31 LAB — RAPID URINE DRUG SCREEN, HOSP PERFORMED
Amphetamines: NOT DETECTED
Barbiturates: NOT DETECTED
Benzodiazepines: NOT DETECTED
Cocaine: NOT DETECTED
Opiates: NOT DETECTED
Tetrahydrocannabinol: NOT DETECTED

## 2019-12-31 LAB — LIPASE, BLOOD: Lipase: 27 U/L (ref 11–51)

## 2019-12-31 LAB — PROTIME-INR
INR: 1.1 (ref 0.8–1.2)
Prothrombin Time: 14.3 seconds (ref 11.4–15.2)

## 2019-12-31 LAB — TROPONIN I (HIGH SENSITIVITY)
Troponin I (High Sensitivity): 6 ng/L (ref <18)
Troponin I (High Sensitivity): 7 ng/L (ref <18)

## 2019-12-31 LAB — PHOSPHORUS: Phosphorus: 3.7 mg/dL (ref 2.5–4.6)

## 2019-12-31 LAB — MAGNESIUM: Magnesium: 2.4 mg/dL (ref 1.7–2.4)

## 2019-12-31 LAB — ETHANOL: Alcohol, Ethyl (B): 72 mg/dL — ABNORMAL HIGH (ref <10)

## 2019-12-31 LAB — SALICYLATE LEVEL: Salicylate Lvl: <7 mg/dL — ABNORMAL LOW (ref 7.0–30.0)

## 2019-12-31 LAB — TSH: TSH: 1.701 u[IU]/mL (ref 0.350–4.500)

## 2019-12-31 MED ORDER — THIAMINE HCL 100 MG/ML IJ SOLN
Freq: Once | INTRAVENOUS | Status: AC
  Filled 2019-12-31: qty 1000

## 2019-12-31 MED ORDER — LEVETIRACETAM IN NACL 1000 MG/100ML IV SOLN
1000.0000 mg | Freq: Once | INTRAVENOUS | Status: AC
  Administered 2019-12-31: 18:00:00 1000 mg via INTRAVENOUS
  Filled 2019-12-31: qty 100

## 2019-12-31 MED ORDER — LORAZEPAM 2 MG/ML IJ SOLN
1.0000 mg | Freq: Once | INTRAMUSCULAR | Status: AC
  Administered 2019-12-31: 22:00:00 1 mg via INTRAVENOUS
  Filled 2019-12-31: qty 1

## 2019-12-31 MED ORDER — LORAZEPAM 2 MG/ML IJ SOLN
2.0000 mg | Freq: Once | INTRAMUSCULAR | Status: AC
  Administered 2019-12-31: 2 mg via INTRAVENOUS

## 2019-12-31 MED ORDER — SODIUM CHLORIDE 0.9 % IV BOLUS
500.0000 mL | Freq: Once | INTRAVENOUS | Status: AC
  Administered 2019-12-31: 500 mL via INTRAVENOUS

## 2019-12-31 NOTE — ED Notes (Signed)
Date and time results received: 12/31/19 6:22 PM  (use smartphrase ".now" to insert current time)  Test: Lactic Critical Value: >11  Name of Provider Notified: Pfeiffer  Orders Received? Or Actions Taken?: Orders Received - See Orders for details

## 2019-12-31 NOTE — Discharge Instructions (Addendum)
1.  Take all of your seizure medications as prescribed. 2.  Avoid triggers of seizures such as use of alcohol and drugs.  Try to get regular rest and sleep at night.  Eat regular meals. 3.  Return to the emergency department if you have any concerns or recurrence of seizures despite taking your medications.

## 2019-12-31 NOTE — ED Notes (Signed)
Seizure precautions in place.

## 2019-12-31 NOTE — ED Notes (Signed)
Date and time results received: 12/31/19 4:29 PM  (use smartphrase ".now" to insert current time)  Test: vbg Critical Value: ph 7.048  Name of Provider Notified: Pfeiffer  Orders Received? Or Actions Taken?: Orders Received - See Orders for details

## 2019-12-31 NOTE — ED Triage Notes (Signed)
Per [facility] pt pick up oon side of road for seizure. Unsure what medication he takes but takes it twice a day. Reports started couple years ago after a TBI.  HR 130, BP 140/96  cbg 107 18g in left hand

## 2019-12-31 NOTE — ED Provider Notes (Signed)
[facility]-EMERGENCY DEPT Provider Note   CSN: [id number] Arrival date & time: 12/31/19  1506     History Chief Complaint  Patient presents with  . Seizures    Lucas Williams is a 44 y.o. male.  HPI Patient is brought in by EMS.  He had been noted by bystanders to be having a seizure at the side of the road.  Very limited additional history.  Patient is not giving history.  On arrival, the patient was alert and talking to the triage staff.  While in the waiting room, he had several minutes of seizure activity and transferred back to a  Room.  Subsequently obtain some history from the patient.  He has history of alcohol abuse but is denying that he has significant regular alcohol consumption or that he experiences withdrawal seizures.  He is reporting that he takes his Keppra but might of missed several doses earlier in the week.  He reports he has history of brain injury.  He does not have headache at this time now.  He denies he is recently been sick with fevers or chills.    Past Medical History:  Diagnosis Date  . Laceration of hand, left   . Seizures [facility])     Patient Active Problem List   Diagnosis Date Noted  . Substance induced mood disorder (HCC) 11/15/2019  . 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 Alcohol abuse 02/11/2017  . 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 Pulled muscle 02/11/2017  . TBI (traumatic brain injury) (HCC) 01/29/2017    Past Surgical History:  Procedure Laterality Date  . FINGER SURGERY     r/t staph infection I&D, hand lac cutting trees  . HERNIA REPAIR     LT ihr  . NERVE REPAIR Left 12/11/2012   Procedure: Left Hand Exploration Wound with Nerve Repair;  Surgeon: Tami Ribas, MD;  Location: [facility];  Service: Orthopedics;  Laterality: Left;       Family History  Problem Relation Age of Onset  . Seizures Neg Hx     Social History   Tobacco  Use  . Smoking status: Current Every Day Smoker    Packs/day: 0.50  . Smokeless tobacco: Never Used  . Tobacco comment: trying to decrease  Substance Use Topics  . Alcohol use: Yes    Alcohol/week: 6.0 standard drinks    Types: 4 Cans of beer, 2 Shots of liquor per week    Comment: weekly; update 11/06/19 pt states he cut down on the amount he drinks  . Drug use: No    Home Medications Prior to Admission medications   Medication Sig Start Date End Date Taking? Authorizing Provider  levETIRAcetam (KEPPRA) 500 MG tablet Take 1 tablet (500 mg total) by mouth 2 (two) times daily. 11/06/19  Yes Anson Fret, MD  ibuprofen (ADVIL) 600 MG tablet Take 1 tablet (600 mg total) by mouth every 6 (six) hours as needed. Patient not taking: Reported on 12/31/2019 10/26/19   Elpidio Anis, PA-C  ondansetron (ZOFRAN ODT) 4 MG disintegrating tablet Take 1 tablet (4 mg total) by mouth every 8 (eight) hours as needed for nausea or vomiting. Patient not taking: Reported on 12/31/2019 05/28/19   Ignacia Palma, MD    Allergies    Patient has no known allergies.  Review of Systems   Review of Systems 10 Systems reviewed and are negative for acute change except as noted in the  HPI.  Physical Exam Updated Vital Signs BP (!) 168/108   Pulse 99   Temp 98.6 F (37 C) (Rectal)   Resp (!) 28   SpO2 96%   Physical Exam Constitutional:      Comments: On first exam patient is drowsy and diaphoretic and warm.  No active seizure activity no respiratory distress.  HENT:     Head: Normocephalic and atraumatic.     Mouth/Throat:     Comments: Patient does have a bite to the right side of his tongue.  No active bleeding.  Dentition intact. Cardiovascular:     Comments: Tachycardia no rub murmur gallop Pulmonary:     Effort: Pulmonary effort is normal.     Breath sounds: Normal breath sounds.  Abdominal:     General: There is no distension.     Palpations: Abdomen is soft.     Tenderness: There is  no abdominal tenderness. There is no guarding.  Musculoskeletal:        General: No swelling or tenderness. Normal range of motion.     Cervical back: Neck supple.     Right lower leg: No edema.     Left lower leg: No edema.     Comments: No deformities of extremities.  No peripheral edema.  Extremities are warm and dry.  Skin:    General: Skin is warm.     Comments: Patient was diaphoretic on first exam.  Skin warm with good color.  Neurological:     Comments: Initially patient was lethargic and could say his name and follow some very simple commands.  He could move both of his toes at command and perform grip strength.  He was however confused and could not do any more detailed exam.  Eyes were conjugate and pupils approximately 5 mm and responsive.  Subsequent exam when patient had improved mental status is for patient with alert with clear speech.  Answering questions appropriate.  All movements coordinated purposeful symmetric.     ED Results / Procedures / Treatments   Labs (all labs ordered are listed, but only abnormal results are displayed) Labs Reviewed  COMPREHENSIVE METABOLIC PANEL - Abnormal; Notable for the following components:      Result Value   CO2 10 (*)    Glucose, Bld 133 (*)    Total Protein 8.9 (*)    AST 254 (*)    ALT 244 (*)    Alkaline Phosphatase 142 (*)    Anion gap 28 (*)    All other components within normal limits  ETHANOL - Abnormal; Notable for the following components:   Alcohol, Ethyl (B) 72 (*)    All other components within normal limits  SALICYLATE LEVEL - Abnormal; Notable for the following components:   Salicylate Lvl <7.0 (*)    All other components within normal limits  ACETAMINOPHEN LEVEL - Abnormal; Notable for the following components:   Acetaminophen (Tylenol), Serum <10 (*)    All other components within normal limits  LACTIC ACID, PLASMA - Abnormal; Notable for the following components:   Lactic Acid, Venous >11.0 (*)    All  other components within normal limits  CBC WITH DIFFERENTIAL/PLATELET - Abnormal; Notable for the following components:   WBC 11.1 (*)    MCV 101.4 (*)    Platelets 143 (*)    Lymphs Abs 4.3 (*)    Monocytes Absolute 1.4 (*)    All other components within normal limits  URINALYSIS, ROUTINE W REFLEX MICROSCOPIC - Abnormal;  Notable for the following components:   Hgb urine dipstick MODERATE (*)    Protein, ur 100 (*)    Bacteria, UA RARE (*)    All other components within normal limits  BLOOD GAS, VENOUS - Abnormal; Notable for the following components:   pH, Ven 7.048 (*)    pCO2, Ven 36.9 (*)    pO2, Ven 108.0 (*)    Bicarbonate 9.7 (*)    Acid-base deficit 21.0 (*)    All other components within normal limits  BLOOD GAS, VENOUS - Abnormal; Notable for the following components:   pCO2, Ven 40.0 (*)    pO2, Ven 50.2 (*)    All other components within normal limits  CBG MONITORING, ED - Abnormal; Notable for the following components:   Glucose-Capillary 123 (*)    All other components within normal limits  LIPASE, BLOOD  LACTIC ACID, PLASMA  PROTIME-INR  RAPID URINE DRUG SCREEN, HOSP PERFORMED  MAGNESIUM  PHOSPHORUS  TSH  LEVETIRACETAM LEVEL  TROPONIN I (HIGH SENSITIVITY)  TROPONIN I (HIGH SENSITIVITY)    EKG EKG Interpretation  Date/Time:  Monday December 31 2019 15:57:36 EDT Ventricular Rate:  124 PR Interval:    QRS Duration: 96 QT Interval:  325 QTC Calculation: 467 R Axis:   4 Text Interpretation: Sinus tachycardia no sig change from previous Confirmed by Charlesetta Shanks [phone]) on 12/31/2019 5:05:36 PM   Radiology No results found.  Procedures Procedures (including critical care time) CRITICAL CARE Performed by: Charlesetta Shanks   Total critical care time: 60 minutes  Critical care time was exclusive of separately billable procedures and treating other patients.  Critical care was necessary to treat or prevent imminent or life-threatening  deterioration.  Critical care was time spent personally by me on the following activities: development of treatment plan with patient and/or surrogate as well as nursing, discussions with consultants, evaluation of patient's response to treatment, examination of patient, obtaining history from patient or surrogate, ordering and performing treatments and interventions, ordering and review of laboratory studies, ordering and review of radiographic studies, pulse oximetry and re-evaluation of patient's condition. Medications Ordered in ED Medications  sodium chloride 0.9 % bolus 500 mL (0 mLs Intravenous Stopped 12/31/19 1712)  sodium chloride 0.9 % 1,000 mL with thiamine 818 mg, folic acid 1 mg, multivitamins adult 10 mL infusion ( Intravenous Stopped 12/31/19 2134)  LORazepam (ATIVAN) injection 2 mg (2 mg Intravenous Given 12/31/19 1727)  levETIRAcetam (KEPPRA) IVPB 1000 mg/100 mL premix (0 mg Intravenous Stopped 12/31/19 1805)  LORazepam (ATIVAN) injection 1 mg (1 mg Intravenous Given 12/31/19 2141)    ED Course  I have reviewed the triage vital signs and the nursing notes.  Pertinent labs & imaging results that were available during my care of the patient were reviewed by me and considered in my medical decision making (see chart for details).  Clinical Course as of Dec 30 2201  Mon Dec 31, 2019  1747 Patient is much more alert and appropriate at this time.  His speech is clear.  He can answer questions.  He reports he has been compliant with his Keppra but might of missed some doses a few days ago.  He is denying alcohol use or possibility of alcohol withdrawal.   [MP]  2031 Patient is alert.  He is answering questions appropriately.  He has some fine tremor when he holds his hands in complete extension.  Patient reiterates that he does not get alcohol withdrawal.  He reports he had a  beer and a shot this evening but does not drink heavily and does not go into withdrawal.   [MP]    Clinical  Course User Index [MP] Arby Barrette, MD   MDM Rules/Calculators/A&P                     Patient presents with seizures.  He has known history of seizure disorder.  Patient reports several missed doses of Keppra earlier in the week.  Patient was loaded with Keppra.  Patient also has history of alcohol use.  He was given Ativan.  Once seizure resolved and patient was hydrated mental status is clear.  He has had no recurrence of seizure.  He has been easy to awaken and no residual signs of confusion.  Patient denies heavy use of alcohol.  He reports he can go days without drinking and not get tremors or seizures.  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  Patient counseled on following up as soon as possible with neurology.  Return precautions reviewed.  Final Clinical Impression(s) / ED Diagnoses Final diagnoses:  Seizure [facility])  Seizure disorder [facility])    Rx / DC Orders ED Discharge Orders    None       Arby Barrette, MD 12/31/19 2207

## 2019-12-31 NOTE — ED Notes (Signed)
Called lab to display updated reuslts

## 2020-01-01 ENCOUNTER — Telehealth: Payer: Self-pay | Admitting: *Deleted

## 2020-01-01 NOTE — Telephone Encounter (Signed)
Message from Dr. Lucia Gaskins on 01/01/20:    I would like to increse is keppra. I see he had breakthrough seizures due to missing some doses but I still think he would benefit from going up to 1000mg  twice daily. Please give him a call thanks   ---------- I called the pt. His vm was full. I called his sister (on [name]) and LVM asking for call back. Advised of office hours. Left office number in message.

## 2020-01-03 ENCOUNTER — Encounter: Payer: Self-pay | Admitting: *Deleted

## 2020-01-03 ENCOUNTER — Ambulatory Visit: Payer: Self-pay | Admitting: Family Medicine

## 2020-01-03 DIAGNOSIS — M79672 Pain in left foot: Secondary | ICD-10-CM | POA: Insufficient documentation

## 2020-01-03 DIAGNOSIS — Y9302 Activity, running: Secondary | ICD-10-CM | POA: Insufficient documentation

## 2020-01-03 DIAGNOSIS — X58XXXA Exposure to other specified factors, initial encounter: Secondary | ICD-10-CM | POA: Insufficient documentation

## 2020-01-03 DIAGNOSIS — Y929 Unspecified place or not applicable: Secondary | ICD-10-CM | POA: Insufficient documentation

## 2020-01-03 DIAGNOSIS — Y999 Unspecified external cause status: Secondary | ICD-10-CM | POA: Insufficient documentation

## 2020-01-03 DIAGNOSIS — S90822A Blister (nonthermal), left foot, initial encounter: Secondary | ICD-10-CM | POA: Insufficient documentation

## 2020-01-03 DIAGNOSIS — S39012A Strain of muscle, fascia and tendon of lower back, initial encounter: Secondary | ICD-10-CM | POA: Insufficient documentation

## 2020-01-03 DIAGNOSIS — F17210 Nicotine dependence, cigarettes, uncomplicated: Secondary | ICD-10-CM | POA: Insufficient documentation

## 2020-01-03 DIAGNOSIS — S90821A Blister (nonthermal), right foot, initial encounter: Secondary | ICD-10-CM | POA: Insufficient documentation

## 2020-01-03 DIAGNOSIS — M79671 Pain in right foot: Secondary | ICD-10-CM | POA: Insufficient documentation

## 2020-01-03 DIAGNOSIS — F10120 Alcohol abuse with intoxication, uncomplicated: Secondary | ICD-10-CM | POA: Insufficient documentation

## 2020-01-03 NOTE — Telephone Encounter (Addendum)
Tried to reach pt for a third time. VM full. Tried to reach pt's sister [name] (on [place]). LVM asking for call back.   Letter mailed to pt since I have been unable to reach him over a course of 3 days.

## 2020-01-04 ENCOUNTER — Encounter (HOSPITAL_COMMUNITY): Payer: Self-pay

## 2020-01-04 ENCOUNTER — Emergency Department (HOSPITAL_COMMUNITY)
Admission: EM | Admit: 2020-01-04 | Discharge: 2020-01-04 | Disposition: A | Discharge status: Home / Self Care | Payer: Self-pay | Attending: Emergency Medicine | Admitting: Emergency Medicine

## 2020-01-04 ENCOUNTER — Other Ambulatory Visit: Payer: Self-pay

## 2020-01-04 DIAGNOSIS — S90822A Blister (nonthermal), left foot, initial encounter: Secondary | ICD-10-CM

## 2020-01-04 DIAGNOSIS — F10920 Alcohol use, unspecified with intoxication, uncomplicated: Secondary | ICD-10-CM

## 2020-01-04 DIAGNOSIS — S39012A Strain of muscle, fascia and tendon of lower back, initial encounter: Secondary | ICD-10-CM

## 2020-01-04 DIAGNOSIS — S90821A Blister (nonthermal), right foot, initial encounter: Secondary | ICD-10-CM

## 2020-01-04 MED ORDER — IBUPROFEN 200 MG PO TABS
600.0000 mg | ORAL_TABLET | Freq: Once | ORAL | Status: AC
  Administered 2020-01-04: 600 mg via ORAL
  Filled 2020-01-04: qty 3

## 2020-01-04 NOTE — Discharge Instructions (Signed)
Thank you for allowing me to care for you today in the Emergency Department.   Please follow-up with Dr. Logan Bores regarding the blisters on your feet.  Do not pop them.  This is likely from the way your shoes are rubbing against your feet.  This should resolve if you wear another pair shoes.  You can take 600 mg of ibuprofen with food every 6 hours as needed for pain.  Apply warm or cool compresses.  Follow-up with Dr. Logan Bores if your symptoms do not improve with this regimen.  Follow-up with Dr. Daisy Blossom regarding your Keppra dosing.  Return to the emergency department if you become unable to walk, if you develop significant redness, warmth, or thick, mucus-like drainage from the wounds on your feet, or other new, concerning symptoms.

## 2020-01-04 NOTE — ED Provider Notes (Signed)
[place] DEPT Provider Note   CSN: [id number] Arrival date & time: 01/03/20  2334     History Chief Complaint  Patient presents with  . Back Pain    Lucas Williams is a 44 y.o. male with a history of alcohol use disorder, epilepsy, and TBI who presents to the emergency department with a chief complaint of bilateral foot pain.  The patient states he has been "running and jumping around like a 44 year old" over the last few days.  He is unable to state why he has had significantly increased activity over the last few days, but subsequently has developed bilateral foot pain that radiates up his bilateral lower legs.  He is also endorsing bilateral low back pain.  He feels as if he pulled a muscle.  Pain in his low back is worse with movement.  Pain in his bilateral feet is worse with weightbearing and ambulation.  He has noticed several blisters on his bilateral feet since the pain began.  No drainage, redness, or warmth.  He denies any recent falls.  No numbness, weakness, fever, chills, GU complaints.  No history of cancer or IV drug use.  He reports that at approximately 20:30 he consumed about 3 shots of liquor and 5 beers over the course of an hour.  No treatment prior to arrival.  Reports that he has been noncompliant with his home Keppra because he does not like the way it makes him feel.  He is following up with Dr. Lavell Anchors on May 5.  He denies any recent seizure-like activity since he was discharged from the ER earlier this week.  The history is provided by the patient. No language interpreter was used.       Past Medical History:  Diagnosis Date  . Laceration of hand, left   . Seizures [facility])     Patient Active Problem List   Diagnosis Date Noted  . Substance induced mood disorder ([place]) 11/15/2019  . Focal epilepsy with impairment of consciousness ([place]) 11/06/2019  . Alcohol abuse 02/11/2017  . Adjustment reaction with anxiety and  depression 02/11/2017  . Pulled muscle 02/11/2017  . TBI (traumatic brain injury) ([place]) 01/29/2017    Past Surgical History:  Procedure Laterality Date  . FINGER SURGERY     r/t staph infection I&D, hand lac cutting trees  . HERNIA REPAIR     LT ihr  . NERVE REPAIR Left 12/11/2012   Procedure: Left Hand Exploration Wound with Nerve Repair;  Surgeon: Tennis Must, MD;  Location: [place];  Service: Orthopedics;  Laterality: Left;       Family History  Problem Relation Age of Onset  . Seizures Neg Hx     Social History   Tobacco Use  . Smoking status: Current Every Day Smoker    Packs/day: 0.50  . Smokeless tobacco: Never Used  . Tobacco comment: trying to decrease  Substance Use Topics  . Alcohol use: Yes    Alcohol/week: 6.0 standard drinks    Types: 4 Cans of beer, 2 Shots of liquor per week    Comment: weekly; update 11/06/19 pt states he cut down on the amount he drinks  . Drug use: No    Home Medications Prior to Admission medications   Medication Sig Start Date End Date Taking? Authorizing Provider  levETIRAcetam (KEPPRA) 500 MG tablet Take 1 tablet (500 mg total) by mouth 2 (two) times daily. 11/06/19   Melvenia Beam, MD  Allergies    Patient has no known allergies.  Review of Systems   Review of Systems  Constitutional: Negative for appetite change and fever.  Respiratory: Negative for shortness of breath.   Cardiovascular: Negative for chest pain.  Gastrointestinal: Negative for abdominal pain, diarrhea, nausea and vomiting.  Genitourinary: Negative for dysuria.  Musculoskeletal: Positive for back pain, gait problem and myalgias. Negative for arthralgias, joint swelling, neck pain and neck stiffness.  Skin: Positive for wound. Negative for color change and rash.  Allergic/Immunologic: Negative for immunocompromised state.  Neurological: Positive for syncope. Negative for weakness and headaches.  Psychiatric/Behavioral: Negative for  confusion.    Physical Exam Updated Vital Signs BP (!) 141/80 (BP Location: Right Arm)   Pulse 91   Temp 98.8 F (37.1 C) (Oral)   Resp 17   Ht 6' (1.829 m)   Wt 113.4 kg   SpO2 98%   BMI 33.91 kg/m   Physical Exam Vitals and nursing note reviewed.  Constitutional:      Appearance: He is well-developed.     Comments: Patient appears intoxicated.  HENT:     Head: Normocephalic.  Eyes:     Conjunctiva/sclera: Conjunctivae normal.     Comments: Bilateral eyes are injected.  Cardiovascular:     Rate and Rhythm: Normal rate and regular rhythm.     Heart sounds: No murmur.  Pulmonary:     Effort: Pulmonary effort is normal.  Abdominal:     General: There is no distension.     Palpations: Abdomen is soft.  Musculoskeletal:     Cervical back: Neck supple.     Comments: No tenderness to the cervical, thoracic, or lumbar spinous processes.  No crepitus or step-offs.   Neurovascularly intact at the bilateral lower extremities.  No focal tenderness to the bilateral feet or ankles.  No tenderness to the bilateral calves.  No edema.  Joints of the bilateral lower extremities are nontender with full active and passive range of motion.  Skin:    General: Skin is warm and dry.     Comments: Blister is noted on the medial aspect of the right hallux.  No surrounding signs of infection.  No drainage.  There is a blister noted to the right posterior heel that is filled with blood.  No drainage.  Multiple nondraining blisters are noted to the dorsum of the digits on the left feet.  Large calluses are noted to the soles of the bilateral feet.  No evidence of infection.  Neurological:     Mental Status: He is alert.     Comments: Speech is slightly slurred.  Minimal ataxia.  Psychiatric:        Behavior: Behavior normal.   Right foot   Right foot   Left foot      ED Results / Procedures / Treatments   Labs (all labs ordered are listed, but only abnormal results are  displayed) Labs Reviewed - No data to display  EKG None  Radiology No results found.  Procedures Procedures (including critical care time)  Medications Ordered in ED Medications  ibuprofen (ADVIL) tablet 600 mg (600 mg Oral Given 01/04/20 0239)    ED Course  I have reviewed the triage vital signs and the nursing notes.  Pertinent labs & imaging results that were available during my care of the patient were reviewed by me and considered in my medical decision making (see chart for details).    MDM Rules/Calculators/A&P  44 year old male with a history of alcohol use disorder, epilepsy, and TBI presenting with bilateral foot pain radiating up his legs that began after he has had increased physical activity over the last few days.  On physical exam, he has multiple blisters noted to the bilateral feet.  No evidence of infection, including abscess or cellulitis.  No focal tenderness.  I suspect this is the source of his pain.  He is also endorsing bilateral low back pain.  No GU complaints.  No red flags.  No tenderness to the spine on exam.  Suspect musculoskeletal strain.  Doubt cauda equina.  Ibuprofen given for pain.  Will give a referral to podiatry for follow-up and the patient was encouraged to wear a different pair of shoes as this was likely the etiology of his symptoms.  On initial exam, patient appears acutely intoxicated.  He was was observed in the ER until he clinically appeared sober.  He was able to ambulate without ataxia and slurred speech had resolved prior to discharge.  ER return precautions given.  He is hemodynamically stable in no acute distress.  Safe discharge to home with outpatient follow-up to podiatry.  Final Clinical Impression(s) / ED Diagnoses Final diagnoses:  Blister of left foot, initial encounter  Blister of right foot, initial encounter  Acute myofascial strain of lumbar region, initial encounter  Acute alcoholic intoxication  without complication [facility])    Rx / DC Orders ED Discharge Orders    None       Barkley Boards, PA-C 01/04/20 0304    Dione Booze, MD 01/04/20 [id number]

## 2020-01-04 NOTE — Telephone Encounter (Signed)
Pt's sister Stephan Minister called, she can be reached at [phone]

## 2020-01-04 NOTE — ED Triage Notes (Signed)
Pt c/o generalized body aches and describes pains shooting from him back down both legs.

## 2020-01-06 ENCOUNTER — Emergency Department (HOSPITAL_COMMUNITY)
Admission: EM | Admit: 2020-01-06 | Discharge: 2020-01-06 | Disposition: A | Discharge status: Home / Self Care | Payer: Self-pay | Attending: Emergency Medicine | Admitting: Emergency Medicine

## 2020-01-06 ENCOUNTER — Encounter (HOSPITAL_COMMUNITY): Payer: Self-pay | Admitting: Emergency Medicine

## 2020-01-06 ENCOUNTER — Other Ambulatory Visit: Payer: Self-pay

## 2020-01-06 DIAGNOSIS — F17200 Nicotine dependence, unspecified, uncomplicated: Secondary | ICD-10-CM | POA: Insufficient documentation

## 2020-01-06 DIAGNOSIS — F10920 Alcohol use, unspecified with intoxication, uncomplicated: Secondary | ICD-10-CM | POA: Insufficient documentation

## 2020-01-06 LAB — LEVETIRACETAM LEVEL: Levetiracetam Lvl: <1 ug/mL — ABNORMAL LOW (ref 10.0–40.0)

## 2020-01-06 NOTE — ED Provider Notes (Signed)
[facility]-EMERGENCY DEPT Provider Note   CSN: [id number] Arrival date & time: 01/06/20  0045     History Chief Complaint  Patient presents with  . Alcohol Intoxication    Level 5 caveat due to alcohol intoxication  Lucas Williams is a 44 y.o. male with history of seizures, alcohol abuse, TBI presents brought in by EMS for evaluation of alcohol intoxication.  Reportedly the patient was found at the [facility] on [facility] and was sleeping at a table there.  Patient is asleep on my initial assessment but very easily aroused and conversational.  He tells me that he has had 5 or 6 shots of bourbon and 3 beers today.  Denies seizures.  He tells me that he recently picked off some skin from a blister to the right foot because it was bothering him.  Otherwise he has no complaints.  He is oriented to person place and time.  The history is provided by the patient.       Past Medical History:  Diagnosis Date  . Laceration of hand, left   . Seizures [facility])     Patient Active Problem List   Diagnosis Date Noted  . Substance induced mood disorder (HCC) 11/15/2019  . 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 Alcohol abuse 02/11/2017  . 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 Pulled muscle 02/11/2017  . TBI (traumatic brain injury) (HCC) 01/29/2017    Past Surgical History:  Procedure Laterality Date  . FINGER SURGERY     r/t staph infection I&D, hand lac cutting trees  . HERNIA REPAIR     LT ihr  . NERVE REPAIR Left 12/11/2012   Procedure: Left Hand Exploration Wound with Nerve Repair;  Surgeon: Tami Ribas, MD;  Location: [facility];  Service: Orthopedics;  Laterality: Left;       Family History  Problem Relation Age of Onset  . Seizures Neg Hx     Social History   Tobacco Use  . Smoking status: Current Every Day Smoker    Packs/day: 0.50  . Smokeless tobacco: Never Used  . Tobacco  comment: trying to decrease  Substance Use Topics  . Alcohol use: Yes    Alcohol/week: 6.0 standard drinks    Types: 4 Cans of beer, 2 Shots of liquor per week    Comment: weekly; update 11/06/19 pt states he cut down on the amount he drinks  . Drug use: No    Home Medications Prior to Admission medications   Medication Sig Start Date End Date Taking? Authorizing Provider  levETIRAcetam (KEPPRA) 500 MG tablet Take 1 tablet (500 mg total) by mouth 2 (two) times daily. 11/06/19   Anson Fret, MD    Allergies    Patient has no known allergies.  Review of Systems   Review of Systems  Constitutional: Negative for chills and fever.  Respiratory: Negative for shortness of breath.   Cardiovascular: Negative for chest pain.  Gastrointestinal: Negative for abdominal pain.  Skin: Positive for wound.  Neurological: Negative for seizures.    Physical Exam Updated Vital Signs BP 135/72   Pulse 74   Temp 97.7 F (36.5 C)   Resp 20   Ht 6' (1.829 m)   Wt 113.4 kg   SpO2 95%   BMI 33.91 kg/m   Physical Exam Vitals and nursing note reviewed.  Constitutional:      General: He is not in acute distress.  Appearance: He is well-developed.     Comments: Sleeping, easily aroused and remains alert and conversational when awake  HENT:     Head: Normocephalic and atraumatic.  Eyes:     General:        Right eye: No discharge.        Left eye: No discharge.     Conjunctiva/sclera: Conjunctivae normal.  Neck:     Vascular: No JVD.     Trachea: No tracheal deviation.  Cardiovascular:     Rate and Rhythm: Normal rate and regular rhythm.     Pulses: Normal pulses.     Heart sounds: Normal heart sounds.  Pulmonary:     Effort: Pulmonary effort is normal.     Breath sounds: Normal breath sounds.  Abdominal:     General: There is no distension.     Tenderness: There is no abdominal tenderness. There is no guarding or rebound.  Musculoskeletal:        General: No tenderness.    Skin:    General: Skin is warm and dry.     Findings: No erythema.     Comments: 3 x 2 cm de-roofed blister to the medial aspect of the left heel.  No surrounding erythema, induration or abnormal drainage.  Neurological:     Mental Status: He is alert.     Comments: Oriented to person place and time.  Moves all extremities spontaneously without difficulty.  Speech is mildly dysarthric in the setting of alcohol intoxication.  Psychiatric:        Behavior: Behavior normal.     ED Results / Procedures / Treatments   Labs (all labs ordered are listed, but only abnormal results are displayed) Labs Reviewed - No data to display  EKG None  Radiology No results found.  Procedures Procedures (including critical care time)  Medications Ordered in ED Medications - No data to display  ED Course  I have reviewed the triage vital signs and the nursing notes.  Pertinent labs & imaging results that were available during my care of the patient were reviewed by me and considered in my medical decision making (see chart for details).    MDM Rules/Calculators/A&P                      Patient presents brought in by EMS for evaluation of alcohol intoxication.  He admits to drinking several beers and a few liquor beverages today.  He is afebrile, vital signs are stable.  He is nontoxic in appearance.  No signs of head trauma.  Doubt seizure.  Clinically he is intoxicated.  He mentions to me that he has a blister that he deroofed on his left heel.  No signs of secondary skin infection on examination.  No concern for necrotizing fasciitis.  We will observe him until he is clinically sober.  6:00AM Patient reevaluated.  He was sleeping but easily aroused and remains alert upon awakening.  He is oriented to person place time and events.  He is ambulatory with steady gait and balance.  He is tolerating p.o. fluids without difficulty.  He is clinically sober.  Stable for discharge at this time though I  encouraged follow-up with Lavinia Sharps at the [facility] for reevaluation.  Discussed strict ED return precautions.  Patient verbalized understanding of and agreement with plan and patient stable for discharge at this time. Final Clinical Impression(s) / ED Diagnoses Final diagnoses:  Alcoholic intoxication without complication (HCC)  Rx / DC Orders ED Discharge Orders    None       Debroah Baller 96/88/64 [id number]    Delora Fuel, MD 04/09/81 1205

## 2020-01-06 NOTE — ED Notes (Signed)
Pt awake and appropriate for d/c. NAD noted.

## 2020-01-06 NOTE — ED Triage Notes (Signed)
Patient brought by [facility] at the [facility] on [address]. Patient says he passed at the table at [facility].

## 2020-01-06 NOTE — ED Notes (Signed)
Pt lying in bed, eye's closed, chest rising and falling. Will continue to monitor.  

## 2020-01-06 NOTE — ED Notes (Signed)
Pt lying in bed, eye's closed, chest rising and falling. Pt to MTF and will reassess.

## 2020-01-06 NOTE — ED Notes (Signed)
Pt lying in bed. Awakens easily. Pt denies any needs. Will continue to monitor.  

## 2020-01-06 NOTE — ED Notes (Signed)
Pt lying in bed awake but drowsy. Warm blanket given. Pt denies any other needs. Will continue to monitor.

## 2020-01-06 NOTE — Discharge Instructions (Signed)
Follow-up with Lucas Williams.  Return to the emergency department if any concerning signs or symptoms develop.

## 2020-01-07 NOTE — Telephone Encounter (Signed)
Angie and Megan: Can we discuss please? I'd like some guidance thanks  Bethany, if patient has a pcp this should be referred to them but I will also ask Angie and Aundra Millet for some guidance on how to handle this thanks
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
## 2020-01-17 NOTE — Telephone Encounter (Signed)
Per Dr. Lucia Gaskins, will see pt at his f/u next week. No record of PCP in chart.
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
## 2020-01-23 ENCOUNTER — Encounter: Payer: Self-pay | Admitting: Family Medicine

## 2020-01-23 ENCOUNTER — Ambulatory Visit: Payer: Self-pay | Admitting: Family Medicine

## 2020-02-07 ENCOUNTER — Ambulatory Visit: Payer: Self-pay | Admitting: Neurology

## 2020-02-15 ENCOUNTER — Other Ambulatory Visit: Payer: Self-pay

## 2020-02-15 ENCOUNTER — Encounter (HOSPITAL_COMMUNITY): Payer: Self-pay | Admitting: Emergency Medicine

## 2020-02-15 ENCOUNTER — Emergency Department (HOSPITAL_COMMUNITY)
Admission: EM | Admit: 2020-02-15 | Discharge: 2020-02-15 | Disposition: A | Discharge status: Home / Self Care | Payer: Self-pay | Attending: Emergency Medicine | Admitting: Emergency Medicine

## 2020-02-15 DIAGNOSIS — G40909 Epilepsy, unspecified, not intractable, without status epilepticus: Secondary | ICD-10-CM

## 2020-02-15 DIAGNOSIS — F17210 Nicotine dependence, cigarettes, uncomplicated: Secondary | ICD-10-CM | POA: Insufficient documentation

## 2020-02-15 DIAGNOSIS — E876 Hypokalemia: Secondary | ICD-10-CM

## 2020-02-15 DIAGNOSIS — R030 Elevated blood-pressure reading, without diagnosis of hypertension: Secondary | ICD-10-CM

## 2020-02-15 DIAGNOSIS — R569 Unspecified convulsions: Secondary | ICD-10-CM

## 2020-02-15 DIAGNOSIS — Z79899 Other long term (current) drug therapy: Secondary | ICD-10-CM | POA: Insufficient documentation

## 2020-02-15 LAB — BASIC METABOLIC PANEL
Anion gap: 8 (ref 5–15)
BUN: 13 mg/dL (ref 6–20)
CO2: 25 mmol/L (ref 22–32)
Calcium: 8.5 mg/dL — ABNORMAL LOW (ref 8.9–10.3)
Chloride: 105 mmol/L (ref 98–111)
Creatinine, Ser: 0.76 mg/dL (ref 0.61–1.24)
GFR calc Af Amer: >60 mL/min (ref >60)
GFR calc non Af Amer: >60 mL/min (ref >60)
Glucose, Bld: 108 mg/dL — ABNORMAL HIGH (ref 70–99)
Potassium: 3.4 mmol/L — ABNORMAL LOW (ref 3.5–5.1)
Sodium: 138 mmol/L (ref 135–145)

## 2020-02-15 MED ORDER — LEVETIRACETAM IN NACL 500 MG/100ML IV SOLN
500.0000 mg | Freq: Once | INTRAVENOUS | Status: AC
  Administered 2020-02-15: 500 mg via INTRAVENOUS
  Filled 2020-02-15: qty 100

## 2020-02-15 MED ORDER — POTASSIUM CHLORIDE CRYS ER 20 MEQ PO TBCR
40.0000 meq | EXTENDED_RELEASE_TABLET | Freq: Once | ORAL | Status: AC
  Administered 2020-02-15: 40 meq via ORAL
  Filled 2020-02-15: qty 2

## 2020-02-15 NOTE — Discharge Instructions (Signed)
It was our pleasure to provide your ER care today - we hope that you feel better.  Take your seizure medication as prescribed. Follow up with neurologist in 1 week. No driving, swimming, operating heavy machinery, or other potentially dangerous activity until cleared to do so by your doctor.   From today's labs, your potassium level is slightly low (3.4) - eat plenty of fruits and vegetables, and follow up with your doctor.  Also, your blood pressure is high today - limit salt intake, and see attached information - follow up with primary care doctor in the coming week for recheck of blood pressure.   Return to ER if worse, new symptoms, new or severe pain, recurrent seizures, numbness/weakness, change in speech or vision, chest pain, trouble breathing, or other concern.

## 2020-02-15 NOTE — ED Triage Notes (Signed)
Pt reports hx seizures and had one this morning about 3-4 hours ago. Reports takes Sheralyn Boatman and last took after seizure.
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
## 2020-02-19 LAB — LEVETIRACETAM LEVEL: Levetiracetam Lvl: <1 ug/mL — ABNORMAL LOW (ref 10.0–40.0)

## 2020-03-08 ENCOUNTER — Other Ambulatory Visit: Payer: Self-pay

## 2020-03-08 ENCOUNTER — Encounter (HOSPITAL_COMMUNITY): Payer: Self-pay | Admitting: *Deleted

## 2020-03-08 ENCOUNTER — Emergency Department (HOSPITAL_COMMUNITY)
Admission: EM | Admit: 2020-03-08 | Discharge: 2020-03-09 | Disposition: A | Discharge status: Home / Self Care | Payer: Self-pay | Attending: Emergency Medicine | Admitting: Emergency Medicine

## 2020-03-08 DIAGNOSIS — F17200 Nicotine dependence, unspecified, uncomplicated: Secondary | ICD-10-CM | POA: Insufficient documentation

## 2020-03-08 DIAGNOSIS — R945 Abnormal results of liver function studies: Secondary | ICD-10-CM | POA: Insufficient documentation

## 2020-03-08 DIAGNOSIS — R7989 Other specified abnormal findings of blood chemistry: Secondary | ICD-10-CM

## 2020-03-08 DIAGNOSIS — R569 Unspecified convulsions: Secondary | ICD-10-CM | POA: Insufficient documentation

## 2020-03-08 LAB — CBG MONITORING, ED: Glucose-Capillary: 89 mg/dL (ref 70–99)

## 2020-03-08 MED ORDER — LEVETIRACETAM IN NACL 500 MG/100ML IV SOLN: 500.0000 mg | Freq: Once | INTRAVENOUS | Status: DC

## 2020-03-08 MED ORDER — SODIUM CHLORIDE 0.9 % IV BOLUS
500.0000 mL | Freq: Once | INTRAVENOUS | Status: AC
  Administered 2020-03-09: 500 mL via INTRAVENOUS

## 2020-03-08 MED ORDER — LEVETIRACETAM IN NACL 1000 MG/100ML IV SOLN
1000.0000 mg | Freq: Once | INTRAVENOUS | Status: AC
  Administered 2020-03-09: 1000 mg via INTRAVENOUS
  Filled 2020-03-08: qty 100

## 2020-03-08 NOTE — ED Triage Notes (Signed)
Pt states he had a seizure at work about 2.5 hours ago. Just a little sore now

## 2020-03-08 NOTE — ED Provider Notes (Signed)
[place] DEPT Provider Note   CSN: [id number] Arrival date & time: 03/08/20  1603     History Chief Complaint  Patient presents with  . Seizures    Lucas Williams is a 44 y.o. male presenting for evaluation after seizure.  Patient states he was sitting at work when he had a witnessed seizure by a Mudlogger.  He states he did not fall or hit his head.  Since then, he has been feeling slightly sore and overall tired, but denies any further seizure-like activity.  He states he ran out of his Keppra yesterday, last dose was yesterday morning.  This he has missed 2 doses of his Keppra.  He denies any other medical problems noted on the medications daily.  He denies recent fevers, chills, chest pain, shortness breath, cough, nausea, vomiting, abdominal pain, urinary symptoms, normal bowel movements.  He reports he now smokes 1/2 pack cigarettes a day.  Reports intermittent alcohol use, none today or yesterday (reports 6 drinks/wk).  He denies drug use.  He follows with Dr. Lavell Anchors from neurology.  He states he will not be able to get his Keppra filled until Monday, in 2 days.  Additional history obtained from chart review.  Patient with a history of seizure disorder as well as several ER visits for alcohol intoxication.  HPI     Past Medical History:  Diagnosis Date  . Laceration of hand, left   . Seizures [facility])     Patient Active Problem List   Diagnosis Date Noted  . Substance induced mood disorder ([place]) 11/15/2019  . Focal epilepsy with impairment of consciousness ([place]) 11/06/2019  . Alcohol abuse 02/11/2017  . 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 Pulled muscle 02/11/2017  . TBI (traumatic brain injury) ([place]) 01/29/2017    Past Surgical History:  Procedure Laterality Date  . FINGER SURGERY     r/t staph infection I&D, hand lac cutting trees  . HERNIA REPAIR     LT ihr  . NERVE REPAIR Left 12/11/2012   Procedure: Left  Hand Exploration Wound with Nerve Repair;  Surgeon: Tennis Must, MD;  Location: [place];  Service: Orthopedics;  Laterality: Left;       Family History  Problem Relation Age of Onset  . Seizures Neg Hx     Social History   Tobacco Use  . Smoking status: Current Every Day Smoker    Packs/day: 0.50  . Smokeless tobacco: Never Used  . Tobacco comment: trying to decrease  Vaping Use  . Vaping Use: Former  Substance Use Topics  . Alcohol use: Yes    Alcohol/week: 6.0 standard drinks    Types: 4 Cans of beer, 2 Shots of liquor per week    Comment: weekly; update 11/06/19 pt states he cut down on the amount he drinks  . Drug use: No    Home Medications Prior to Admission medications   Medication Sig Start Date End Date Taking? Authorizing Provider  levETIRAcetam (KEPPRA) 500 MG tablet Take 1 tablet (500 mg total) by mouth 2 (two) times daily. 11/06/19  Yes Melvenia Beam, MD  Multiple Vitamin (MULTIVITAMIN ADULT) TABS Take 1 tablet by mouth daily.   Yes [provider]    Allergies    Patient has no known allergies.  Review of Systems   Review of Systems  Neurological: Positive for seizures.  All other systems reviewed and are negative.   Physical Exam Updated Vital Signs BP Marland Kitchen)  162/84 (BP Location: Right Arm)   Pulse 76   Temp 98.5 F (36.9 C) (Oral)   Resp 18   Ht 6' (1.829 m)   Wt 104.3 kg   SpO2 98%   BMI 31.19 kg/m   Physical Exam Vitals and nursing note reviewed.  Constitutional:      General: He is not in acute distress.    Appearance: He is well-developed.     Comments: Resting in the bed in no acute distress  HENT:     Head: Normocephalic and atraumatic.     Comments: No signs of head trauma Eyes:     Extraocular Movements: Extraocular movements intact.     Conjunctiva/sclera: Conjunctivae normal.     Pupils: Pupils are equal, round, and reactive to light.  Cardiovascular:     Rate and Rhythm: Normal rate and regular  rhythm.     Pulses: Normal pulses.  Pulmonary:     Effort: Pulmonary effort is normal. No respiratory distress.     Breath sounds: Normal breath sounds. No wheezing.  Abdominal:     General: There is no distension.     Palpations: Abdomen is soft. There is no mass.     Tenderness: There is no abdominal tenderness. There is no guarding or rebound.  Musculoskeletal:        General: Normal range of motion.     Cervical back: Normal range of motion and neck supple.  Skin:    General: Skin is warm and dry.     Capillary Refill: Capillary refill takes less than 2 seconds.  Neurological:     Mental Status: He is alert and oriented to person, place, and time.     GCS: GCS eye subscore is 4. GCS verbal subscore is 5. GCS motor subscore is 6.     Cranial Nerves: Cranial nerves are intact.     Sensory: Sensation is intact.     Motor: Motor function is intact.     Comments: No focal neurologic deficits.  CN intact.  Strength and sensation intact x4.  Ambulatory without difficulty.     ED Results / Procedures / Treatments   Labs (all labs ordered are listed, but only abnormal results are displayed) Labs Reviewed  MAGNESIUM - Abnormal; Notable for the following components:      Result Value   Magnesium 1.6 (*)    All other components within normal limits  COMPREHENSIVE METABOLIC PANEL - Abnormal; Notable for the following components:   AST 104 (*)    ALT 125 (*)    All other components within normal limits  CBC WITH DIFFERENTIAL/PLATELET  CBG MONITORING, ED    EKG None  Radiology No results found.  Procedures Procedures (including critical care time)  Medications Ordered in ED Medications  levETIRAcetam (KEPPRA) IVPB 1000 mg/100 mL premix (0 mg Intravenous Stopped 03/09/20 0120)  sodium chloride 0.9 % bolus 500 mL (0 mLs Intravenous Stopped 03/09/20 0120)    ED Course  I have reviewed the triage vital signs and the nursing notes.  Pertinent labs & imaging results that were  available during my care of the patient were reviewed by me and considered in my medical decision making (see chart for details).    MDM Rules/Calculators/A&P                          Patient presenting for evaluation after seizure.  On exam, patient is neurologically intact.  Seizure occurred approximately 9  hours prior to my evaluation of the patient.  He has not had any further seizure-like activity.  Consider seizure due to not having his medicine.  Patient denies significant alcohol use, however has a history of ER visits for alcohol intoxication.  At this time, he does not appear to be in withdrawals.  At his last ER visit for seizure, he was found to have hypokalemia, as such consider metabolic abnormality.  Will check labs, give loading dose of Keppra, and reassess.  Labs interpreted by me, overall reassuring.  Mild elevation in LFTs, improved from previous.  Electrolytes otherwise stable.  Patient without further seizure-like activity, was monitored for total of 10-1/2 hours in the ED without seizure.  Discussed continued seizure precautions and restrictions.  Discussed follow-up with Dr. Lucia Gaskins as he has had 2 seizures in the past month.  Discussed importance of taking medication.  Discussed stopping alcohol use due to elevated LFTs.  At this time, patient appears safe for discharge.  Return precautions given.  Patient states he understands and agrees to plan.  Final Clinical Impression(s) / ED Diagnoses Final diagnoses:  Seizure (HCC)  Elevated LFTs    Rx / DC Orders ED Discharge Orders    None       Alveria Apley, PA-C 03/09/20 0334    Pricilla Loveless, MD 03/09/20 [phone]

## 2020-03-09 LAB — COMPREHENSIVE METABOLIC PANEL
ALT: 125 U/L — ABNORMAL HIGH (ref 0–44)
AST: 104 U/L — ABNORMAL HIGH (ref 15–41)
Albumin: 4 g/dL (ref 3.5–5.0)
Alkaline Phosphatase: 61 U/L (ref 38–126)
Anion gap: 12 (ref 5–15)
BUN: 12 mg/dL (ref 6–20)
CO2: 24 mmol/L (ref 22–32)
Calcium: 8.9 mg/dL (ref 8.9–10.3)
Chloride: 101 mmol/L (ref 98–111)
Creatinine, Ser: 0.75 mg/dL (ref 0.61–1.24)
GFR calc Af Amer: >60 mL/min (ref >60)
GFR calc non Af Amer: >60 mL/min (ref >60)
Glucose, Bld: 88 mg/dL (ref 70–99)
Potassium: 3.9 mmol/L (ref 3.5–5.1)
Sodium: 137 mmol/L (ref 135–145)
Total Bilirubin: 1.1 mg/dL (ref 0.3–1.2)
Total Protein: 7.7 g/dL (ref 6.5–8.1)

## 2020-03-09 LAB — CBC WITH DIFFERENTIAL/PLATELET
Abs Immature Granulocytes: 0.04 10*3/uL (ref 0.00–0.07)
Basophils Absolute: 0.1 10*3/uL (ref 0.0–0.1)
Basophils Relative: 1 %
Eosinophils Absolute: 0.1 10*3/uL (ref 0.0–0.5)
Eosinophils Relative: 2 %
HCT: 44.5 % (ref 39.0–52.0)
Hemoglobin: 15.1 g/dL (ref 13.0–17.0)
Immature Granulocytes: 1 %
Lymphocytes Relative: 24 %
Lymphs Abs: 1.8 10*3/uL (ref 0.7–4.0)
MCH: 31.8 pg (ref 26.0–34.0)
MCHC: 33.9 g/dL (ref 30.0–36.0)
MCV: 93.7 fL (ref 80.0–100.0)
Monocytes Absolute: 0.9 10*3/uL (ref 0.1–1.0)
Monocytes Relative: 12 %
Neutro Abs: 4.6 10*3/uL (ref 1.7–7.7)
Neutrophils Relative %: 60 %
Platelets: 252 10*3/uL (ref 150–400)
RBC: 4.75 MIL/uL (ref 4.22–5.81)
RDW: 13.2 % (ref 11.5–15.5)
WBC: 7.6 10*3/uL (ref 4.0–10.5)
nRBC: 0 % (ref 0.0–0.2)

## 2020-03-09 LAB — MAGNESIUM: Magnesium: 1.6 mg/dL — ABNORMAL LOW (ref 1.7–2.4)

## 2020-03-09 NOTE — Discharge Instructions (Signed)
Follow up with your neurologist, as you have had at least 2 seizures in the past month.  Your liver enzymes are slightly elevated today. This is likely due to your alcohol use. It is important that you stop drinking and get your liver enzymes rechecked with your primary care doctor.  Return to the ER with any new, worsening, or concerning symptoms.

## 2020-05-10 ENCOUNTER — Other Ambulatory Visit: Payer: Self-pay

## 2020-05-10 ENCOUNTER — Emergency Department (HOSPITAL_COMMUNITY)
Admission: EM | Admit: 2020-05-10 | Discharge: 2020-05-10 | Disposition: A | Discharge status: Home / Self Care | Payer: Self-pay | Attending: Emergency Medicine | Admitting: Emergency Medicine

## 2020-05-10 ENCOUNTER — Encounter (HOSPITAL_COMMUNITY): Payer: Self-pay

## 2020-05-10 DIAGNOSIS — R21 Rash and other nonspecific skin eruption: Secondary | ICD-10-CM | POA: Insufficient documentation

## 2020-05-10 DIAGNOSIS — T63421A Toxic effect of venom of ants, accidental (unintentional), initial encounter: Secondary | ICD-10-CM

## 2020-05-10 DIAGNOSIS — S80869A Insect bite (nonvenomous), unspecified lower leg, initial encounter: Secondary | ICD-10-CM | POA: Insufficient documentation

## 2020-05-10 DIAGNOSIS — F17200 Nicotine dependence, unspecified, uncomplicated: Secondary | ICD-10-CM | POA: Insufficient documentation

## 2020-05-10 DIAGNOSIS — W57XXXA Bitten or stung by nonvenomous insect and other nonvenomous arthropods, initial encounter: Secondary | ICD-10-CM | POA: Insufficient documentation

## 2020-05-10 DIAGNOSIS — Y999 Unspecified external cause status: Secondary | ICD-10-CM | POA: Insufficient documentation

## 2020-05-10 DIAGNOSIS — Y9389 Activity, other specified: Secondary | ICD-10-CM | POA: Insufficient documentation

## 2020-05-10 DIAGNOSIS — Y9289 Other specified places as the place of occurrence of the external cause: Secondary | ICD-10-CM | POA: Insufficient documentation

## 2020-05-10 LAB — COMPREHENSIVE METABOLIC PANEL
ALT: 173 U/L — ABNORMAL HIGH (ref 0–44)
AST: 117 U/L — ABNORMAL HIGH (ref 15–41)
Albumin: 3.9 g/dL (ref 3.5–5.0)
Alkaline Phosphatase: 123 U/L (ref 38–126)
Anion gap: 11 (ref 5–15)
BUN: 12 mg/dL (ref 6–20)
CO2: 21 mmol/L — ABNORMAL LOW (ref 22–32)
Calcium: 8.6 mg/dL — ABNORMAL LOW (ref 8.9–10.3)
Chloride: 107 mmol/L (ref 98–111)
Creatinine, Ser: 0.85 mg/dL (ref 0.61–1.24)
GFR calc Af Amer: >60 mL/min (ref >60)
GFR calc non Af Amer: >60 mL/min (ref >60)
Glucose, Bld: 110 mg/dL — ABNORMAL HIGH (ref 70–99)
Potassium: 4.2 mmol/L (ref 3.5–5.1)
Sodium: 139 mmol/L (ref 135–145)
Total Bilirubin: 0.5 mg/dL (ref 0.3–1.2)
Total Protein: 8 g/dL (ref 6.5–8.1)

## 2020-05-10 LAB — CBC WITH DIFFERENTIAL/PLATELET
Abs Immature Granulocytes: 0.03 10*3/uL (ref 0.00–0.07)
Basophils Absolute: 0 10*3/uL (ref 0.0–0.1)
Basophils Relative: 0 %
Eosinophils Absolute: 0.3 10*3/uL (ref 0.0–0.5)
Eosinophils Relative: 4 %
HCT: 42.8 % (ref 39.0–52.0)
Hemoglobin: 14.2 g/dL (ref 13.0–17.0)
Immature Granulocytes: 0 %
Lymphocytes Relative: 30 %
Lymphs Abs: 2.1 10*3/uL (ref 0.7–4.0)
MCH: 31.1 pg (ref 26.0–34.0)
MCHC: 33.2 g/dL (ref 30.0–36.0)
MCV: 93.9 fL (ref 80.0–100.0)
Monocytes Absolute: 0.7 10*3/uL (ref 0.1–1.0)
Monocytes Relative: 10 %
Neutro Abs: 3.9 10*3/uL (ref 1.7–7.7)
Neutrophils Relative %: 56 %
Platelets: 176 10*3/uL (ref 150–400)
RBC: 4.56 MIL/uL (ref 4.22–5.81)
RDW: 13.6 % (ref 11.5–15.5)
WBC: 7 10*3/uL (ref 4.0–10.5)
nRBC: 0 % (ref 0.0–0.2)

## 2020-05-10 MED ORDER — IBUPROFEN 600 MG PO TABS
600.0000 mg | ORAL_TABLET | Freq: Four times a day (QID) | ORAL | 0 refills | Status: DC | PRN
Start: 2020-05-10 — End: 2022-01-18

## 2020-05-10 NOTE — Discharge Instructions (Addendum)
Follow up with your doctor or with the [facility] for recheck if you develop worsening symptoms.   Take ibuprofen as prescribed. Wash with soap and water at least twice daily to keep the area clean and to avoid infection.

## 2020-05-10 NOTE — ED Triage Notes (Signed)
Pt has many ant bites to left lower extremity. Sts fatigue and pain.

## 2020-05-10 NOTE — ED Provider Notes (Signed)
[facility]-EMERGENCY DEPT Provider Note   CSN: [id number] Arrival date & time: 05/10/20  0051     History Chief Complaint  Patient presents with  . Insect Bite    Chosen Lucas Williams is a 44 y.o. male.  Patient to ED for evaluation of painful red bumps on the lower extremities x 2 days. He reports helping a friend do some work in his yard and feels he was exposed to Archivist, though he cannot provide history of visualized ants. He reports now he feels fatigued and nauseous. No vomiting or known fever.   The history is provided by the patient. No language interpreter was used.       Past Medical History:  Diagnosis Date  . Laceration of hand, left   . Seizures [facility])     Patient Active Problem List   Diagnosis Date Noted  . Substance induced mood disorder (HCC) 11/15/2019  . 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 Alcohol abuse 02/11/2017  . 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 Pulled muscle 02/11/2017  . TBI (traumatic brain injury) (HCC) 01/29/2017    Past Surgical History:  Procedure Laterality Date  . FINGER SURGERY     r/t staph infection I&D, hand lac cutting trees  . HERNIA REPAIR     LT ihr  . NERVE REPAIR Left 12/11/2012   Procedure: Left Hand Exploration Wound with Nerve Repair;  Surgeon: Tami Ribas, MD;  Location: [facility];  Service: Orthopedics;  Laterality: Left;       Family History  Problem Relation Age of Onset  . Seizures Neg Hx     Social History   Tobacco Use  . Smoking status: Current Every Day Smoker    Packs/day: 0.50  . Smokeless tobacco: Never Used  . Tobacco comment: trying to decrease  Vaping Use  . Vaping Use: Former  Substance Use Topics  . Alcohol use: Yes    Alcohol/week: 6.0 standard drinks    Types: 4 Cans of beer, 2 Shots of liquor per week    Comment: weekly; update 11/06/19 pt states he cut down on the amount he drinks  . Drug  use: No    Home Medications Prior to Admission medications   Medication Sig Start Date End Date Taking? Authorizing Provider  levETIRAcetam (KEPPRA) 500 MG tablet Take 1 tablet (500 mg total) by mouth 2 (two) times daily. 11/06/19   Anson Fret, MD  Multiple Vitamin (MULTIVITAMIN ADULT) TABS Take 1 tablet by mouth daily.    [provider]    Allergies    Patient has no known allergies.  Review of Systems   Review of Systems  Constitutional: Positive for fatigue. Negative for chills.  HENT: Negative for trouble swallowing.   Respiratory: Negative for shortness of breath.   Cardiovascular: Negative for chest pain.  Gastrointestinal: Positive for nausea. Negative for vomiting.  Skin: Positive for rash.    Physical Exam Updated Vital Signs BP (!) 146/103 (BP Location: Right Arm)   Pulse 94   Temp 97.9 F (36.6 C) (Oral)   Resp 18   Ht 6' (1.829 m)   Wt 105 kg   SpO2 95%   BMI 31.39 kg/m   Physical Exam Vitals and nursing note reviewed.  Constitutional:      Appearance: He is well-developed.  Cardiovascular:     Pulses: Normal pulses.  Pulmonary:     Effort: Pulmonary effort is normal.  Musculoskeletal:  General: Normal range of motion.     Cervical back: Normal range of motion.  Skin:    General: Skin is warm and dry.     Comments: Multiple/numerous small pustules on red base limited to bilateral lower extremities. No drainage. No swelling.  Neurological:     Mental Status: He is alert and oriented to person, place, and time.     ED Results / Procedures / Treatments   Labs (all labs ordered are listed, but only abnormal results are displayed) Labs Reviewed  COMPREHENSIVE METABOLIC PANEL - Abnormal; Notable for the following components:      Result Value   CO2 21 (*)    Glucose, Bld 110 (*)    Calcium 8.6 (*)    AST 117 (*)    ALT 173 (*)    All other components within normal limits  CBC WITH DIFFERENTIAL/PLATELET   Results for  orders placed or performed during the hospital encounter of 05/10/20  CBC with Differential  Result Value Ref Range   WBC 7.0 4.0 - 10.5 K/uL   RBC 4.56 4.22 - 5.81 MIL/uL   Hemoglobin 14.2 13.0 - 17.0 g/dL   HCT 30.1 39 - 52 %   MCV 93.9 80.0 - 100.0 fL   MCH 31.1 26.0 - 34.0 pg   MCHC 33.2 30.0 - 36.0 g/dL   RDW 60.1 09.3 - 23.5 %   Platelets 176 150 - 400 K/uL   nRBC 0.0 0.0 - 0.2 %   Neutrophils Relative % 56 %   Neutro Abs 3.9 1.7 - 7.7 K/uL   Lymphocytes Relative 30 %   Lymphs Abs 2.1 0.7 - 4.0 K/uL   Monocytes Relative 10 %   Monocytes Absolute 0.7 0 - 1 K/uL   Eosinophils Relative 4 %   Eosinophils Absolute 0.3 0 - 0 K/uL   Basophils Relative 0 %   Basophils Absolute 0.0 0 - 0 K/uL   Immature Granulocytes 0 %   Abs Immature Granulocytes 0.03 0.00 - 0.07 K/uL  Comprehensive metabolic panel  Result Value Ref Range   Sodium 139 135 - 145 mmol/L   Potassium 4.2 3.5 - 5.1 mmol/L   Chloride 107 98 - 111 mmol/L   CO2 21 (L) 22 - 32 mmol/L   Glucose, Bld 110 (H) 70 - 99 mg/dL   BUN 12 6 - 20 mg/dL   Creatinine, Ser 5.73 0.61 - 1.24 mg/dL   Calcium 8.6 (L) 8.9 - 10.3 mg/dL   Total Protein 8.0 6.5 - 8.1 g/dL   Albumin 3.9 3.5 - 5.0 g/dL   AST 220 (H) 15 - 41 U/L   ALT 173 (H) 0 - 44 U/L   Alkaline Phosphatase 123 38 - 126 U/L   Total Bilirubin 0.5 0.3 - 1.2 mg/dL   GFR calc non Af Amer >60 >60 mL/min   GFR calc Af Amer >60 >60 mL/min   Anion gap 11 5 - 15    EKG None  Radiology No results found.  Procedures Procedures (including critical care time)  Medications Ordered in ED Medications - No data to display  ED Course  I have reviewed the triage vital signs and the nursing notes.  Pertinent labs & imaging results that were available during my care of the patient were reviewed by me and considered in my medical decision making (see chart for details).    MDM Rules/Calculators/A&P  Patient to ED with painful rash to LE's, now with  fatigue and nausea. No fever.   Rash is bilateral. Appears c/w reaction to fire ant bites. Lab WNL. VSS, afebrile.   He is appropriate for discharge home. Recommend ibuprofen, regular cleaning.   Final Clinical Impression(s) / ED Diagnoses Final diagnoses:  None   1. Fire Teacher, early years/pre  Rx / DC Orders ED Discharge Orders    None       Elpidio Anis, PA-C 05/10/20 0455    Geoffery Lyons, MD 05/10/20 [phone]

## 2020-05-13 ENCOUNTER — Emergency Department (HOSPITAL_COMMUNITY)
Admission: EM | Admit: 2020-05-13 | Discharge: 2020-05-14 | Disposition: A | Discharge status: Home / Self Care | Payer: Self-pay | Attending: Emergency Medicine | Admitting: Emergency Medicine

## 2020-05-13 ENCOUNTER — Other Ambulatory Visit: Payer: Self-pay

## 2020-05-13 ENCOUNTER — Encounter (HOSPITAL_COMMUNITY): Payer: Self-pay

## 2020-05-13 DIAGNOSIS — Z87820 Personal history of traumatic brain injury: Secondary | ICD-10-CM | POA: Insufficient documentation

## 2020-05-13 DIAGNOSIS — Y908 Blood alcohol level of 240 mg/100 ml or more: Secondary | ICD-10-CM | POA: Insufficient documentation

## 2020-05-13 DIAGNOSIS — F17210 Nicotine dependence, cigarettes, uncomplicated: Secondary | ICD-10-CM | POA: Insufficient documentation

## 2020-05-13 DIAGNOSIS — Z79899 Other long term (current) drug therapy: Secondary | ICD-10-CM | POA: Insufficient documentation

## 2020-05-13 DIAGNOSIS — F10129 Alcohol abuse with intoxication, unspecified: Secondary | ICD-10-CM | POA: Insufficient documentation

## 2020-05-13 DIAGNOSIS — F10920 Alcohol use, unspecified with intoxication, uncomplicated: Secondary | ICD-10-CM

## 2020-05-13 DIAGNOSIS — R569 Unspecified convulsions: Secondary | ICD-10-CM | POA: Insufficient documentation

## 2020-05-13 LAB — CBC WITH DIFFERENTIAL/PLATELET
Abs Immature Granulocytes: 0.08 10*3/uL — ABNORMAL HIGH (ref 0.00–0.07)
Basophils Absolute: 0.1 10*3/uL (ref 0.0–0.1)
Basophils Relative: 1 %
Eosinophils Absolute: 0.4 10*3/uL (ref 0.0–0.5)
Eosinophils Relative: 5 %
HCT: 42.5 % (ref 39.0–52.0)
Hemoglobin: 14.4 g/dL (ref 13.0–17.0)
Immature Granulocytes: 1 %
Lymphocytes Relative: 37 %
Lymphs Abs: 3.2 10*3/uL (ref 0.7–4.0)
MCH: 32 pg (ref 26.0–34.0)
MCHC: 33.9 g/dL (ref 30.0–36.0)
MCV: 94.4 fL (ref 80.0–100.0)
Monocytes Absolute: 0.7 10*3/uL (ref 0.1–1.0)
Monocytes Relative: 8 %
Neutro Abs: 4.1 10*3/uL (ref 1.7–7.7)
Neutrophils Relative %: 48 %
Platelets: 213 10*3/uL (ref 150–400)
RBC: 4.5 MIL/uL (ref 4.22–5.81)
RDW: 13.5 % (ref 11.5–15.5)
WBC: 8.5 10*3/uL (ref 4.0–10.5)
nRBC: 0 % (ref 0.0–0.2)
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
## 2020-05-13 NOTE — ED Provider Notes (Signed)
[facility]-EMERGENCY DEPT Provider Note   CSN: [id number] Arrival date & time: 05/13/20  2307     History Chief Complaint  Patient presents with  . Alcohol Intoxication    Lucas Williams is a 44 y.o. male.  The history is provided by the patient and the EMS personnel.    44 year old male with history of seizures, substance abuse, history of TBI, anxiety, depression, alcohol abuse, presenting to the ED after being found unresponsive on the sidewalk.  Police report he seemed to be walking in and out of the street and they followed him for a while.  EMS was called once he was found down, he did respond to 3 mg of IV Narcan by EMS.  On arrival patient is snoring but he does arouse to voice.  He adamantly denies any drug use.  He does admit he has been drinking which he does every day.  When asked about intention of self-harm he states "hell no, why would I do that for?"  Past Medical History:  Diagnosis Date  . Laceration of hand, left   . Seizures [facility])     Patient Active Problem List   Diagnosis Date Noted  . Substance induced mood disorder (HCC) 11/15/2019  . 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 Alcohol abuse 02/11/2017  . 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 Pulled muscle 02/11/2017  . TBI (traumatic brain injury) (HCC) 01/29/2017    Past Surgical History:  Procedure Laterality Date  . FINGER SURGERY     r/t staph infection I&D, hand lac cutting trees  . HERNIA REPAIR     LT ihr  . NERVE REPAIR Left 12/11/2012   Procedure: Left Hand Exploration Wound with Nerve Repair;  Surgeon: Tami Ribas, MD;  Location: [facility];  Service: Orthopedics;  Laterality: Left;       Family History  Problem Relation Age of Onset  . Seizures Neg Hx     Social History   Tobacco Use  . Smoking status: Current Every Day Smoker    Packs/day: 0.50  . Smokeless tobacco: Never Used  .  Tobacco comment: trying to decrease  Vaping Use  . Vaping Use: Former  Substance Use Topics  . Alcohol use: Yes    Alcohol/week: 6.0 standard drinks    Types: 4 Cans of beer, 2 Shots of liquor per week    Comment: weekly; update 11/06/19 pt states he cut down on the amount he drinks  . Drug use: No    Home Medications Prior to Admission medications   Medication Sig Start Date End Date Taking? Authorizing Provider  ibuprofen (ADVIL) 600 MG tablet Take 1 tablet (600 mg total) by mouth every 6 (six) hours as needed. 05/10/20   Elpidio Anis, PA-C  levETIRAcetam (KEPPRA) 500 MG tablet Take 1 tablet (500 mg total) by mouth 2 (two) times daily. 11/06/19   Anson Fret, MD  Multiple Vitamin (MULTIVITAMIN ADULT) TABS Take 1 tablet by mouth daily.    [provider]    Allergies    Patient has no known allergies.  Review of Systems   Review of Systems  Unable to perform ROS: Other    Physical Exam Updated Vital Signs BP 114/81 (BP Location: Left Arm)   Pulse 93   Temp 97.9 F (36.6 C) (Oral)   Resp 16   Ht 6\' 1"  (1.854 m)   Wt 110.2 kg   SpO2 96%  BMI 32.06 kg/m   Physical Exam Vitals and nursing note reviewed.  Constitutional:      Appearance: He is well-developed.     Comments: Snoring but does arouse to voice, appears intoxicated  HENT:     Head: Normocephalic and atraumatic.  Eyes:     Conjunctiva/sclera: Conjunctivae normal.     Pupils: Pupils are equal, round, and reactive to light.  Cardiovascular:     Rate and Rhythm: Normal rate and regular rhythm.     Heart sounds: Normal heart sounds.  Pulmonary:     Effort: Pulmonary effort is normal. No respiratory distress.     Breath sounds: Normal breath sounds. No rhonchi.  Abdominal:     General: Bowel sounds are normal.     Palpations: Abdomen is soft.     Tenderness: There is no abdominal tenderness. There is no rebound.  Musculoskeletal:        General: Normal range of motion.     Cervical back:  Normal range of motion.  Skin:    General: Skin is warm and dry.  Neurological:     Comments: Snoring but awakes when spoken to, moving arms and legs well, slurred speech (intoxicated)     ED Results / Procedures / Treatments   Labs (all labs ordered are listed, but only abnormal results are displayed) Labs Reviewed  CBC WITH DIFFERENTIAL/PLATELET - Abnormal; Notable for the following components:      Result Value   Abs Immature Granulocytes 0.08 (*)    All other components within normal limits  COMPREHENSIVE METABOLIC PANEL - Abnormal; Notable for the following components:   Glucose, Bld 121 (*)    Calcium 8.5 (*)    AST 139 (*)    ALT 188 (*)    All other components within normal limits  ETHANOL - Abnormal; Notable for the following components:   Alcohol, Ethyl (B) 293 (*)    All other components within normal limits  RAPID URINE DRUG SCREEN, HOSP PERFORMED    EKG None  Radiology No results found.  Procedures Procedures (including critical care time)  Medications Ordered in ED Medications - No data to display  ED Course  I have reviewed the triage vital signs and the nursing notes.  Pertinent labs & imaging results that were available during my care of the patient were reviewed by me and considered in my medical decision making (see chart for details).    MDM Rules/Calculators/A&P  44 y.o. M brought in by EMS after being found unresponsive on the sidewalk.  He apparently had been walking in and out of the road per [employer].  He did not respond to IV Narcan.  On arrival he is snoring but arouses quickly to voice.  He appears clinically intoxicated and admits he has been drinking.  He denies any illicit drug use.  He adamantly denies feeling suicidal, actually stating " Hell no, why would I do that?"  Will obtain screening labs and observe.  Labs as above-- ethanol 293.  LFT's elevated, likely due to his EtOH abuse and values are similar compared with prior.  Will monitor  overnight and reassess.  5:37 AM Patient is now awake and alert.  He is up and ambulating around the ED with steady gait.  He is eating sandwich and has asked for ibuprofen as his feet hurt from walking a lot yesterday.  He remains adamant that he did not OD and does not want to harm himself, he was just drunk.  His  UDS was negative.  Feel he is stable for discharge home.  Encouraged to drink responsibly.  He may return here for any new/acute changes.  Final Clinical Impression(s) / ED Diagnoses Final diagnoses:  Alcoholic intoxication without complication [facility])    Rx / DC Orders ED Discharge Orders    None       Garlon Hatchet, PA-C 05/14/20 0553    Nira Conn, MD 05/14/20 [phone]

## 2020-05-14 LAB — COMPREHENSIVE METABOLIC PANEL
ALT: 188 U/L — ABNORMAL HIGH (ref 0–44)
AST: 139 U/L — ABNORMAL HIGH (ref 15–41)
Albumin: 4.1 g/dL (ref 3.5–5.0)
Alkaline Phosphatase: 97 U/L (ref 38–126)
Anion gap: 12 (ref 5–15)
BUN: 17 mg/dL (ref 6–20)
CO2: 24 mmol/L (ref 22–32)
Calcium: 8.5 mg/dL — ABNORMAL LOW (ref 8.9–10.3)
Chloride: 106 mmol/L (ref 98–111)
Creatinine, Ser: 0.93 mg/dL (ref 0.61–1.24)
GFR calc Af Amer: >60 mL/min (ref >60)
GFR calc non Af Amer: >60 mL/min (ref >60)
Glucose, Bld: 121 mg/dL — ABNORMAL HIGH (ref 70–99)
Potassium: 3.9 mmol/L (ref 3.5–5.1)
Sodium: 142 mmol/L (ref 135–145)
Total Bilirubin: 0.6 mg/dL (ref 0.3–1.2)
Total Protein: 7.5 g/dL (ref 6.5–8.1)

## 2020-05-14 LAB — RAPID URINE DRUG SCREEN, HOSP PERFORMED
Amphetamines: NOT DETECTED
Barbiturates: NOT DETECTED
Benzodiazepines: NOT DETECTED
Cocaine: NOT DETECTED
Opiates: NOT DETECTED
Tetrahydrocannabinol: NOT DETECTED

## 2020-05-14 LAB — ETHANOL: Alcohol, Ethyl (B): 293 mg/dL — ABNORMAL HIGH (ref <10)

## 2020-05-14 MED ORDER — ONDANSETRON 8 MG PO TBDP
8.0000 mg | ORAL_TABLET | Freq: Once | ORAL | Status: AC
  Administered 2020-05-14: 8 mg via ORAL
  Filled 2020-05-14: qty 1

## 2020-05-14 MED ORDER — IBUPROFEN 800 MG PO TABS
800.0000 mg | ORAL_TABLET | Freq: Once | ORAL | Status: DC
  Filled 2020-05-14: qty 1

## 2020-05-14 NOTE — Discharge Instructions (Signed)
Drink responsibly. 

## 2020-06-01 ENCOUNTER — Encounter (HOSPITAL_COMMUNITY): Payer: Self-pay | Admitting: *Deleted

## 2020-06-01 ENCOUNTER — Other Ambulatory Visit: Payer: Self-pay

## 2020-06-01 ENCOUNTER — Emergency Department (HOSPITAL_COMMUNITY)
Admission: EM | Admit: 2020-06-01 | Discharge: 2020-06-01 | Disposition: A | Discharge status: Home / Self Care | Payer: Self-pay | Attending: Emergency Medicine | Admitting: Emergency Medicine

## 2020-06-01 ENCOUNTER — Emergency Department (HOSPITAL_COMMUNITY): Payer: Self-pay

## 2020-06-01 DIAGNOSIS — F419 Anxiety disorder, unspecified: Secondary | ICD-10-CM | POA: Insufficient documentation

## 2020-06-01 DIAGNOSIS — F329 Major depressive disorder, single episode, unspecified: Secondary | ICD-10-CM | POA: Insufficient documentation

## 2020-06-01 DIAGNOSIS — R569 Unspecified convulsions: Secondary | ICD-10-CM | POA: Insufficient documentation

## 2020-06-01 DIAGNOSIS — F17200 Nicotine dependence, unspecified, uncomplicated: Secondary | ICD-10-CM | POA: Insufficient documentation

## 2020-06-01 LAB — COMPREHENSIVE METABOLIC PANEL
ALT: 218 U/L — ABNORMAL HIGH (ref 0–44)
AST: 152 U/L — ABNORMAL HIGH (ref 15–41)
Albumin: 3.8 g/dL (ref 3.5–5.0)
Alkaline Phosphatase: 80 U/L (ref 38–126)
Anion gap: 10 (ref 5–15)
BUN: 15 mg/dL (ref 6–20)
CO2: 24 mmol/L (ref 22–32)
Calcium: 9.3 mg/dL (ref 8.9–10.3)
Chloride: 106 mmol/L (ref 98–111)
Creatinine, Ser: 1.07 mg/dL (ref 0.61–1.24)
GFR calc Af Amer: >60 mL/min (ref >60)
GFR calc non Af Amer: >60 mL/min (ref >60)
Glucose, Bld: 103 mg/dL — ABNORMAL HIGH (ref 70–99)
Potassium: 4 mmol/L (ref 3.5–5.1)
Sodium: 140 mmol/L (ref 135–145)
Total Bilirubin: 0.7 mg/dL (ref 0.3–1.2)
Total Protein: 7.6 g/dL (ref 6.5–8.1)

## 2020-06-01 LAB — CBC WITH DIFFERENTIAL/PLATELET
Abs Immature Granulocytes: 0.04 10*3/uL (ref 0.00–0.07)
Basophils Absolute: 0.1 10*3/uL (ref 0.0–0.1)
Basophils Relative: 1 %
Eosinophils Absolute: 0.1 10*3/uL (ref 0.0–0.5)
Eosinophils Relative: 1 %
HCT: 44 % (ref 39.0–52.0)
Hemoglobin: 14.7 g/dL (ref 13.0–17.0)
Immature Granulocytes: 0 %
Lymphocytes Relative: 12 %
Lymphs Abs: 1.2 10*3/uL (ref 0.7–4.0)
MCH: 31.6 pg (ref 26.0–34.0)
MCHC: 33.4 g/dL (ref 30.0–36.0)
MCV: 94.6 fL (ref 80.0–100.0)
Monocytes Absolute: 0.8 10*3/uL (ref 0.1–1.0)
Monocytes Relative: 8 %
Neutro Abs: 7.9 10*3/uL — ABNORMAL HIGH (ref 1.7–7.7)
Neutrophils Relative %: 78 %
Platelets: 189 10*3/uL (ref 150–400)
RBC: 4.65 MIL/uL (ref 4.22–5.81)
RDW: 13.5 % (ref 11.5–15.5)
WBC: 10 10*3/uL (ref 4.0–10.5)
nRBC: 0 % (ref 0.0–0.2)

## 2020-06-01 MED ORDER — LACTATED RINGERS IV BOLUS
1000.0000 mL | Freq: Once | INTRAVENOUS | Status: AC
  Administered 2020-06-01: 1000 mL via INTRAVENOUS

## 2020-06-01 NOTE — ED Provider Notes (Signed)
Emergency Department Provider Note  I have reviewed the triage vital signs and the nursing notes.  HISTORY  Chief Complaint Seizures   HPI Lucas Williams is a 44 y.o. male with past medical history of seizures and substance abuse presents the emergency department today status post seizure.  Patient states he is compliant with his Keppra.  Was at work had no aura and had a generalized seizure.  Had postictal state with EMS.  Blood sugar within was 90 initially low but hypertensive ultimately improved.  Normal rhythm strip.  Brought here for further evaluation.  Patient denies any recent illnesses or trauma.  He bit his tongue during seizure but has no pain elsewhere. States he feels a bit tired.    No other associated or modifying symptoms.    Past Medical History:  Diagnosis Date  . Laceration of hand, left   . Seizures [facility])     Patient Active Problem List   Diagnosis Date Noted  . Substance induced mood disorder (HCC) 11/15/2019  . 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 Alcohol abuse 02/11/2017  . 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 Pulled muscle 02/11/2017  . TBI (traumatic brain injury) (HCC) 01/29/2017    Past Surgical History:  Procedure Laterality Date  . FINGER SURGERY     r/t staph infection I&D, hand lac cutting trees  . HERNIA REPAIR     LT ihr  . NERVE REPAIR Left 12/11/2012   Procedure: Left Hand Exploration Wound with Nerve Repair;  Surgeon: Tami Ribas, MD;  Location: [facility];  Service: Orthopedics;  Laterality: Left;    Current Outpatient Rx  . Order #: [id number] Class: Print  . Order #: [id number] Class: Print  . Order #: [id number] Class: Historical Med    Allergies Patient has no known allergies.  Family History  Problem Relation Age of Onset  . Seizures Neg Hx     Social History Social History   Tobacco Use  . Smoking status: Current Every Day Smoker    Packs/day:  0.50  . Smokeless tobacco: Never Used  . Tobacco comment: trying to decrease  Vaping Use  . Vaping Use: Former  Substance Use Topics  . Alcohol use: Yes    Alcohol/week: 6.0 standard drinks    Types: 4 Cans of beer, 2 Shots of liquor per week    Comment: weekly; update 11/06/19 pt states he cut down on the amount he drinks  . Drug use: No    Review of Systems  All other systems negative except as documented in the HPI. All pertinent positives and negatives as reviewed in the HPI. ____________________________________________  PHYSICAL EXAM:  VITAL SIGNS: ED Triage Vitals [06/01/20 1846]  Enc Vitals Group     BP (!) 145/106     Pulse Rate (!) 105     Resp 18     Temp 98.7 F (37.1 C)     Temp Source Oral     SpO2 (!) 87 %    Constitutional: Alert and oriented. Well appearing and in no acute distress. Eyes: Conjunctivae are normal. PERRL. EOMI. Head: Atraumatic. Nose: No congestion/rhinnorhea. Mouth/Throat: Mucous membranes are moist.  Oropharynx non-erythematous. Tongue with small laceration/abtrasion that is approximated, not through, hemostatic. Neck: No stridor.  No meningeal signs.   Cardiovascular: Normal rate, regular rhythm. Good peripheral circulation. Grossly normal heart sounds.   Respiratory: Normal respiratory effort.  No retractions. Lungs CTAB. Gastrointestinal: Soft and nontender. No distention.  Musculoskeletal:  No lower extremity tenderness nor edema. No gross deformities of extremities. Neurologic:  Normal speech and language. No gross focal neurologic deficits are appreciated.  Skin:  Skin is warm, dry and intact. No rash noted.  ____________________________________________   LABS (all labs ordered are listed, but only abnormal results are displayed)  Labs Reviewed  CBC WITH DIFFERENTIAL/PLATELET - Abnormal; Notable for the following components:      Result Value   Neutro Abs 7.9 (*)    All other components within normal limits  COMPREHENSIVE  METABOLIC PANEL - Abnormal; Notable for the following components:   Glucose, Bld 103 (*)    AST 152 (*)    ALT 218 (*)    All other components within normal limits  LEVETIRACETAM LEVEL   ____________________________________________  EKG   EKG Interpretation  Date/Time:  Sunday June 01 2020 19:34:02 EDT Ventricular Rate:  94 PR Interval:    QRS Duration: 110 QT Interval:  375 QTC Calculation: 469 R Axis:   -5 Text Interpretation: Sinus rhythm Anteroseptal infarct, age indeterminate same as 2 minutes prior, no charge Confirmed by Marily Memos [phone]) on 06/01/2020 7:38:53 PM       ____________________________________________  RADIOLOGY  DG Chest Portable 1 View  Result Date: 06/01/2020 CLINICAL DATA:  Seizure. EXAM: PORTABLE CHEST 1 VIEW COMPARISON:  Jan 29, 2017 FINDINGS: The heart size and mediastinal contours are within normal limits. Both lungs are clear. The visualized skeletal structures are unremarkable. IMPRESSION: No active disease. Electronically Signed   By: Katherine Mantle M.D.   On: 06/01/2020 19:53   ____________________________________________  PROCEDURES  Procedure(s) performed:   Procedures ____________________________________________  INITIAL IMPRESSION / ASSESSMENT AND PLAN / ED COURSE   This patient presents to the ED for concern of LOC/Seizure like activity, this involves an extensive number of treatment options, and is a complaint that carries with it a high risk of complications and morbidity.  The differential diagnosis includes syncope vs seizure, neurologic vs cardiac     Lab Tests:   I Ordered, reviewed, and interpreted labs, which included cbc/cmp, keppra level pending and otherwise unremarkable   Medicines ordered:   I ordered medication lr bolus since he was NPO   Imaging Studies ordered:   I independently visualized and interpreted imaging cxr which showed unremarkable  Additional history obtained:   Additional  history obtained from none  Previous records obtained and reviewed in epic  Consultations Obtained:   I consulted noone  and discussed lab and imaging findings  Reevaluation:  After the interventions stated above, I reevaluated the patient and found him on oxygen.  Apparently he had a low oxygen at some point however I shut off the oxygen over an hour prior to discharge and he did not have any recurrent hypoxia stayed above 95% even while sleeping.  His work-up was unremarkable he is at his baseline.  His Keppra level is pending but his neurologist can follow-up that. Likely breakthrough seizure.  Patient is stable for discharge at this time.   A medical screening exam was performed and I feel the patient has had an appropriate workup for their chief complaint at this time and likelihood of emergent condition existing is low. They have been counseled on decision, discharge, follow up and which symptoms necessitate immediate return to the emergency department. They or their family verbally stated understanding and agreement with plan and discharged in stable condition.   ____________________________________________  FINAL CLINICAL IMPRESSION(S) / ED DIAGNOSES  Final diagnoses:  Seizure-like activity (HCC)  MEDICATIONS GIVEN DURING THIS VISIT:  Medications  lactated ringers bolus 1,000 mL (0 mLs Intravenous Stopped 06/01/20 2244)    NEW OUTPATIENT MEDICATIONS STARTED DURING THIS VISIT:  Discharge Medication List as of 06/01/2020 10:43 PM      Note:  This note was prepared with assistance of Dragon voice recognition software. Occasional wrong-word or sound-a-like substitutions may have occurred due to the inherent limitations of voice recognition software.   Marily Memos, MD 06/01/20 [phone]

## 2020-06-01 NOTE — ED Triage Notes (Signed)
Pt arrived by EMS for seizure while at word. Hx of same, takes keppra, reports complaince. IV to L hand. Pt bite his tongue

## 2020-06-01 NOTE — ED Notes (Signed)
Discharge instructions discussed with pt. Pt verbalized understanding. Pt stable and ambulatory. No signature pad available. 

## 2020-06-06 LAB — LEVETIRACETAM LEVEL: Levetiracetam Lvl: <1 ug/mL — ABNORMAL LOW (ref 10.0–40.0)

## 2020-08-16 ENCOUNTER — Emergency Department (HOSPITAL_COMMUNITY)
Admission: EM | Admit: 2020-08-16 | Discharge: 2020-08-16 | Disposition: A | Discharge status: Home / Self Care | Payer: Self-pay | Attending: Emergency Medicine | Admitting: Emergency Medicine

## 2020-08-16 ENCOUNTER — Other Ambulatory Visit: Payer: Self-pay

## 2020-08-16 DIAGNOSIS — F17200 Nicotine dependence, unspecified, uncomplicated: Secondary | ICD-10-CM | POA: Insufficient documentation

## 2020-08-16 DIAGNOSIS — Z20822 Contact with and (suspected) exposure to covid-19: Secondary | ICD-10-CM | POA: Insufficient documentation

## 2020-08-16 DIAGNOSIS — R5383 Other fatigue: Secondary | ICD-10-CM | POA: Insufficient documentation

## 2020-08-16 LAB — URINALYSIS, ROUTINE W REFLEX MICROSCOPIC
Bilirubin Urine: NEGATIVE
Glucose, UA: NEGATIVE mg/dL
Hgb urine dipstick: NEGATIVE
Ketones, ur: 5 mg/dL — AB
Leukocytes,Ua: NEGATIVE
Nitrite: NEGATIVE
Protein, ur: NEGATIVE mg/dL
Specific Gravity, Urine: 1.025 (ref 1.005–1.030)
pH: 5 (ref 5.0–8.0)

## 2020-08-16 LAB — BASIC METABOLIC PANEL
Anion gap: 10 (ref 5–15)
BUN: 13 mg/dL (ref 6–20)
CO2: 23 mmol/L (ref 22–32)
Calcium: 9.1 mg/dL (ref 8.9–10.3)
Chloride: 105 mmol/L (ref 98–111)
Creatinine, Ser: 0.83 mg/dL (ref 0.61–1.24)
GFR, Estimated: >60 mL/min (ref >60)
Glucose, Bld: 89 mg/dL (ref 70–99)
Potassium: 4.2 mmol/L (ref 3.5–5.1)
Sodium: 138 mmol/L (ref 135–145)

## 2020-08-16 LAB — TSH: TSH: 1.668 u[IU]/mL (ref 0.350–4.500)

## 2020-08-16 LAB — CBC
HCT: 44.9 % (ref 39.0–52.0)
Hemoglobin: 15.3 g/dL (ref 13.0–17.0)
MCH: 31.8 pg (ref 26.0–34.0)
MCHC: 34.1 g/dL (ref 30.0–36.0)
MCV: 93.3 fL (ref 80.0–100.0)
Platelets: 188 10*3/uL (ref 150–400)
RBC: 4.81 MIL/uL (ref 4.22–5.81)
RDW: 12.7 % (ref 11.5–15.5)
WBC: 8.9 10*3/uL (ref 4.0–10.5)
nRBC: 0 % (ref 0.0–0.2)

## 2020-08-16 LAB — CBG MONITORING, ED: Glucose-Capillary: 90 mg/dL (ref 70–99)

## 2020-08-16 LAB — RESP PANEL BY RT-PCR (FLU A&B, COVID) ARPGX2
Influenza A by PCR: NEGATIVE
Influenza B by PCR: NEGATIVE
SARS Coronavirus 2 by RT PCR: NEGATIVE

## 2020-08-16 NOTE — ED Notes (Signed)
Called patient for a room [place]

## 2020-08-16 NOTE — ED Provider Notes (Signed)
[facility]-EMER[facility] Provider Note   CSN: [id number] Arrival date & time: 08/16/20  1424     History Chief Complaint  Patient presents with   Fatigue    Lucas Williams is a 44 y.o. male.  HPI Patient presents with some fatigue and sleepiness.  States that he has been sleeping a lot states that he slept 60 hours over the last few days.  Has had some soft stools but not frankly diarrhea.  States when he does wake up he feels pretty good and is not confused at that time.  Does have a previous traumatic brain injury.  History of seizures but states he does not have any recent seizures that he knows of.  Patient is on 1000 mg of Keppra twice a day.  States that is recently unchanged.  Denies substance abuse.  No headaches.  Has not really been gaining or losing weight.    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   Alcohol abuse 02/11/2017   Adjustment reaction with anxiety and depression 02/11/2017   Pulled muscle 02/11/2017   TBI (traumatic brain injury) (HCC) 01/29/2017    Past Surgical History:  Procedure Laterality Date   FINGER SURGERY     r/t staph infection I&D, hand lac cutting trees   HERNIA REPAIR     LT ihr   NERVE REPAIR Left 12/11/2012   Procedure: Left Hand Exploration Wound with Nerve Repair;  Surgeon: Tami Ribas, MD;  Location: [facility];  Service: Orthopedics;  Laterality: Left;       Family History  Problem Relation Age of Onset   Seizures Neg Hx     Social History   Tobacco Use   Smoking status: Current Every Day Smoker    Packs/day: 0.50   Smokeless tobacco: Never Used   Tobacco comment: trying to decrease  Vaping Use   Vaping Use: Former  Substance Use Topics    Alcohol use: Yes    Alcohol/week: 6.0 standard drinks    Types: 4 Cans of beer, 2 Shots of liquor per week    Comment: weekly; update 11/06/19 pt states he cut down on the amount he drinks   Drug use: No    Home Medications Prior to Admission medications   Medication Sig Start Date End Date Taking? Authorizing Provider  ibuprofen (ADVIL) 600 MG tablet Take 1 tablet (600 mg total) by mouth every 6 (six) hours as needed. 05/10/20  Yes Upstill, Melvenia Beam, PA-C  levETIRAcetam (KEPPRA) 500 MG tablet Take 1 tablet (500 mg total) by mouth 2 (two) times daily. 11/06/19  Yes Anson Fret, MD  Multiple Vitamin (MULTIVITAMIN ADULT) TABS Take 1 tablet by mouth daily.   Yes [provider]  Omega-3 Fatty Acids (FISH OIL) 1200 MG CAPS Take 1,000 mg by mouth daily.   Yes [provider]    Allergies    Patient has no known allergies.  Review of Systems   Review of Systems  Constitutional: Positive for fatigue. Negative for appetite change, fever and unexpected weight change.  Respiratory: Negative for shortness of breath.   Gastrointestinal: Negative for abdominal pain and diarrhea.       Loose stool but not frank diarrhea.  Endocrine: Negative for polyuria.  Genitourinary: Negative for frequency.  Musculoskeletal: Negative for back pain.  Skin: Negative for rash.  Neurological: Negative for light-headedness.  Psychiatric/Behavioral: Negative for confusion.    Physical Exam Updated Vital Signs BP 129/80    Pulse 79    Temp 98.2 F (36.8 C) (Oral)    Resp 19    Ht 6' (1.829 m)    Wt 107 kg    SpO2 98%    BMI 32.01 kg/m   Physical Exam Vitals reviewed.  HENT:     Head: Atraumatic.     Mouth/Throat:     Mouth: Mucous membranes are moist.  Eyes:     Pupils: Pupils are equal, round, and reactive to light.  Neck:     Comments: No palpated thyromegaly. Cardiovascular:     Rate and Rhythm: Regular rhythm.     Heart sounds: Normal heart sounds.  Pulmonary:     Breath  sounds: Normal breath sounds.  Abdominal:     Tenderness: There is no abdominal tenderness.  Musculoskeletal:        General: No tenderness.     Cervical back: Neck supple.  Skin:    General: Skin is warm.     Capillary Refill: Capillary refill takes less than 2 seconds.  Neurological:     Mental Status: He is alert and oriented to person, place, and time.  Psychiatric:        Mood and Affect: Mood normal.     ED Results / Procedures / Treatments   Labs (all labs ordered are listed, but only abnormal results are displayed) Labs Reviewed  URINALYSIS, ROUTINE W REFLEX MICROSCOPIC - Abnormal; Notable for the following components:      Result Value   APPearance HAZY (*)    Ketones, ur 5 (*)    All other components within normal limits  RESP PANEL BY RT-PCR (FLU A&B, COVID) ARPGX2  BASIC METABOLIC PANEL  CBC  TSH  CBG MONITORING, ED    EKG EKG Interpretation  Date/Time:  Saturday August 16 2020 15:50:03 EST Ventricular Rate:  81 PR Interval:    QRS Duration: 97 QT Interval:  379 QTC Calculation: 440 R Axis:   66 Text Interpretation: Sinus rhythm RSR' in V1 or V2, probably normal variant ST elev, probable normal early repol pattern Confirmed by Benjiman Core [phone]) on 08/16/2020 3:51:12 PM   Radiology No results found.  Procedures Procedures (including critical care time)  Medications Ordered in ED Medications - No data to display  ED Course  I have reviewed the triage vital signs and the nursing notes.  Pertinent labs & imaging results that were available during my care of the patient were reviewed by me and considered in my medical decision making (see chart for details).    MDM Rules/Calculators/A&P                          Patient with extra sleepiness.  Lab work reassuring.  TSH reassuring.  In between episodes is at his baseline.  Does have previous head injury.  Awake and appropriate.  Did not think that he needs further imaging at this time.   Does not have severe electrolyte abnormality.  I reviewed labs and work-up.  Discharge home with outpatient follow-up as needed. Final Clinical Impression(s) / ED Diagnoses Final diagnoses:  Fatigue, unspecified type    Rx / DC Orders ED Discharge Orders    None       Benjiman Core, MD  08/16/20 1800 ° °

## 2020-08-16 NOTE — Discharge Instructions (Addendum)
Follow-up with your doctors.  After I talked with you the thyroid level came back and was normal.

## 2020-08-16 NOTE — ED Triage Notes (Addendum)
Patient reports since Monday he has been sleeping a lot. Says he slept 60 hours this week. Patient also reports more bowel movements than normal, not diarrhea, but soft. Denies pain. Patient reports he works at [employer] and needs to know if he is able to go back to work as he does not want to spread anything to his co workers. Patient says he did OTC covid test yesterday and it was negative.

## 2020-10-13 ENCOUNTER — Encounter (HOSPITAL_COMMUNITY): Payer: Self-pay

## 2020-10-13 ENCOUNTER — Other Ambulatory Visit: Payer: Self-pay

## 2020-10-13 ENCOUNTER — Emergency Department (HOSPITAL_COMMUNITY): Payer: Self-pay

## 2020-10-13 ENCOUNTER — Emergency Department (HOSPITAL_COMMUNITY)
Admission: EM | Admit: 2020-10-13 | Discharge: 2020-10-13 | Disposition: A | Discharge status: Home / Self Care | Payer: Self-pay | Attending: Emergency Medicine | Admitting: Emergency Medicine

## 2020-10-13 DIAGNOSIS — R030 Elevated blood-pressure reading, without diagnosis of hypertension: Secondary | ICD-10-CM | POA: Insufficient documentation

## 2020-10-13 DIAGNOSIS — Z20822 Contact with and (suspected) exposure to covid-19: Secondary | ICD-10-CM | POA: Insufficient documentation

## 2020-10-13 DIAGNOSIS — J441 Chronic obstructive pulmonary disease with (acute) exacerbation: Secondary | ICD-10-CM | POA: Insufficient documentation

## 2020-10-13 DIAGNOSIS — F17200 Nicotine dependence, unspecified, uncomplicated: Secondary | ICD-10-CM | POA: Insufficient documentation

## 2020-10-13 DIAGNOSIS — J069 Acute upper respiratory infection, unspecified: Secondary | ICD-10-CM | POA: Insufficient documentation

## 2020-10-13 LAB — SARS CORONAVIRUS 2 (TAT 6-24 HRS): SARS Coronavirus 2: NEGATIVE

## 2020-10-13 LAB — POC SARS CORONAVIRUS 2 AG -  ED: SARS Coronavirus 2 Ag: NEGATIVE

## 2020-10-13 MED ORDER — AEROCHAMBER Z-STAT PLUS/MEDIUM MISC
1.0000 | Freq: Once | Status: AC
  Administered 2020-10-13: 1
  Filled 2020-10-13: qty 1

## 2020-10-13 MED ORDER — ALBUTEROL SULFATE HFA 108 (90 BASE) MCG/ACT IN AERS
2.0000 | INHALATION_SPRAY | Freq: Once | RESPIRATORY_TRACT | Status: AC
  Administered 2020-10-13: 2 via RESPIRATORY_TRACT
  Filled 2020-10-13: qty 6.7

## 2020-10-13 MED ORDER — AZITHROMYCIN 250 MG PO TABS: 250.0000 mg | ORAL_TABLET | Freq: Every day | ORAL | 0 refills | Status: DC

## 2020-10-13 NOTE — ED Provider Notes (Signed)
[facility]-EMERGENCY DEPT Provider Note   CSN: [id number] Arrival date & time: 10/13/20  1217     History Chief Complaint  Patient presents with  . Shortness of Breath  . Cough    Lucas Williams is a [age] y.o. male.  History includes alcohol abuse, seizures.  Patient arrives today for complaint of chest congestion cough ongoing for the past 10 days.  Patient reports that he has had a cough productive initially with clear white sputum which has occasionally be yellow in color, couugh has been constant since onset no aggravating or alleviating factors this is associated with a burning pain in the center of his chest worse with cough improves with rest, burning pain is mild intensity does not radiate.  Patient is worried he may have developed pneumonia.  He reports that he has had 1 dose [employer] Covid vaccine but has not had a booster.  He denies any known Covid positive contacts but does work around a lot of people and food.  Denies headache, vision changes, neck stiffness, hemoptysis, abdominal pain, nausea/vomiting, diarrhea, extremity swelling/color change, history of blood clot, recent surgery/immobilization, exogenous hormone use, history of cancer or any additional concerns.  HPI     Past Medical History:  Diagnosis Date  . Laceration of hand, left   . Seizures [facility](HCC)     Patient Active Problem List   Diagnosis Date Noted  . Substance induced mood disorder (HCC) 11/15/2019  . 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 Alcohol abuse 02/11/2017  . 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 Pulled muscle 02/11/2017  . TBI (traumatic brain injury) (HCC) 01/29/2017    Past Surgical History:  Procedure Laterality Date  . FINGER SURGERY     r/t staph infection I&D, hand lac cutting trees  . HERNIA REPAIR     LT ihr  . NERVE REPAIR Left 12/11/2012   Procedure: Left Hand Exploration Wound with Nerve  Repair;  Surgeon: Tami RibasKevin R Kuzma, MD;  Location: [facility];  Service: Orthopedics;  Laterality: Left;       Family History  Problem Relation Age of Onset  . Seizures Neg Hx     Social History   Tobacco Use  . Smoking status: Current Every Day Smoker    Packs/day: 0.35  . Smokeless tobacco: Never Used  . Tobacco comment: trying to decrease  Vaping Use  . Vaping Use: Former  Substance Use Topics  . Alcohol use: Yes    Alcohol/week: 6.0 standard drinks    Types: 4 Cans of beer, 2 Shots of liquor per week    Comment: weekly; update 11/06/19 pt states he cut down on the amount he drinks  . Drug use: No    Home Medications Prior to Admission medications   Medication Sig Start Date End Date Taking? Authorizing Provider  azithromycin (ZITHROMAX) 250 MG tablet Take 1 tablet (250 mg total) by mouth daily. Take first 2 tablets together, then 1 every day until finished. 10/13/20  Yes Harlene SaltsMorelli, Elvera Almario A, PA-C  ibuprofen (ADVIL) 600 MG tablet Take 1 tablet (600 mg total) by mouth every 6 (six) hours as needed. 05/10/20   Elpidio AnisUpstill, Shari, PA-C  levETIRAcetam (KEPPRA) 500 MG tablet Take 1 tablet (500 mg total) by mouth 2 (two) times daily. 11/06/19   Anson FretAhern, Antonia B, MD  Multiple Vitamin (MULTIVITAMIN ADULT) TABS Take 1 tablet by mouth daily.    [provider]  Omega-3 Fatty Acids (  FISH OIL) 1200 MG CAPS Take 1,000 mg by mouth daily.    [provider]    Allergies    Patient has no known allergies.  Review of Systems   Review of Systems  Constitutional: Negative for chills and fever.  Respiratory: Positive for cough.   Cardiovascular: Positive for chest pain (Burning pain with cough). Negative for leg swelling.  Gastrointestinal: Negative.  Negative for abdominal pain, nausea and vomiting.  Skin: Negative.  Negative for color change.  Neurological: Negative.  Negative for weakness, numbness and headaches.    Physical Exam Updated Vital Signs BP (!)  186/137   Pulse 77   Temp 98.4 F (36.9 C) (Oral)   Resp 17   Ht 6' (1.829 m)   Wt 111.1 kg   SpO2 97%   BMI 33.23 kg/m   Physical Exam Constitutional:      General: He is not in acute distress.    Appearance: Normal appearance. He is well-developed. He is not ill-appearing or diaphoretic.  HENT:     Head: Normocephalic and atraumatic.  Eyes:     General: Vision grossly intact. Gaze aligned appropriately.     Pupils: Pupils are equal, round, and reactive to light.  Neck:     Trachea: Trachea and phonation normal.  Cardiovascular:     Rate and Rhythm: Normal rate and regular rhythm.  Pulmonary:     Effort: Pulmonary effort is normal. No respiratory distress.     Breath sounds: Normal breath sounds.  Abdominal:     General: There is no distension.     Palpations: Abdomen is soft.     Tenderness: There is no abdominal tenderness. There is no guarding or rebound.  Musculoskeletal:        General: Normal range of motion.     Cervical back: Normal range of motion.     Right lower leg: No tenderness. No edema.     Left lower leg: No tenderness. No edema.  Skin:    General: Skin is warm and dry.  Neurological:     Mental Status: He is alert.     GCS: GCS eye subscore is 4. GCS verbal subscore is 5. GCS motor subscore is 6.     Comments: Speech is clear and goal oriented, follows commands Major Cranial nerves without deficit, no facial droop Moves extremities without ataxia, coordination intact  Psychiatric:        Behavior: Behavior normal.    ED Results / Procedures / Treatments   Labs (all labs ordered are listed, but only abnormal results are displayed) Labs Reviewed  SARS CORONAVIRUS 2 (TAT 6-24 HRS)  POC SARS CORONAVIRUS 2 AG -  ED    EKG EKG Interpretation  Date/Time:  Monday October 13 2020 14:47:12 EST Ventricular Rate:  88 PR Interval:    QRS Duration: 100 QT Interval:  372 QTC Calculation: 451 R Axis:   -13 Text Interpretation: Sinus rhythm Left  ventricular hypertrophy ST-t wave abnormality No significant change since last tracing Abnormal ECG Confirmed by Gerhard Munch [phone]) on 10/13/2020 3:17:49 PM   Radiology DG Chest Portable 1 View  Result Date: 10/13/2020 CLINICAL DATA:  Short of breath and cough EXAM: PORTABLE CHEST 1 VIEW COMPARISON:  06/01/2020 FINDINGS: The heart size and mediastinal contours are within normal limits. Both lungs are clear. The visualized skeletal structures are unremarkable. IMPRESSION: No active disease. Electronically Signed   By: Marlan Palau M.D.   On: 10/13/2020 14:59    Procedures Procedures  Medications Ordered in ED Medications  albuterol (VENTOLIN HFA) 108 (90 Base) MCG/ACT inhaler 2 puff (has no administration in time range)  aerochamber Z-Stat Plus/medium 1 each (has no administration in time range)    ED Course  I have reviewed the triage vital signs and the nursing notes.  Pertinent labs & imaging results that were available during my care of the patient were reviewed by me and considered in my medical decision making (see chart for details).    MDM Rules/Calculators/A&P                         Additional history obtained from: 1. Nursing notes from this visit. 2. Review of electronic medical records. ---------------------------- 45 year old male presented for cough x10 days cough initially only productive with white sputum now has some yellow-tinged sputum.  He is a everyday smoker, no diagnosis of COPD.  He has no shortness of breath.  Endorses a mild burning sensation of his chest with cough.  He is low risk by Wells criteria and PERC negative.  On exam he is well-appearing no acute distress cranial nerves intact, no meningeal signs, airway clear, no evidence of PTA, RPA, Ludwig's or other deep space infections.  Cardiopulmonary exam is unremarkable.  Abdomen soft nontender.  Neurovascular intact to all 4 extremities without evidence of DVT.  EKG reviewed without acute ischemic  changes.  Chest x-ray negative for acute findings.  Rapid Covid test is negative however this is a antigen test will need follow-up PCR test.  Due to testing restrictions the 24-hour Covid test was ordered patient will follow up on his MyChart account for results.  He has no hypoxia on room air and cardiopulmonary exam is unremarkable.  Given that he is a smoker will treat with Z-Pak for possible underlying COPD.  Work note given patient encouraged to follow-up closely with his PCP.  Low suspicion for ACS, pulmonary embolism, dissection, anemia, bacterial pneumonia or other emergent pathologies at this time.  Patient incidentally noted to be hypertensive in the ER today is asymptomatic, he was encouraged to follow-up with his PCP within 1 week for blood pressure recheck and medication management.  Signs or symptoms symptoms of urgency/emergency discussed with patient and he is to return to the ER if they occur.  At this time there does not appear to be any evidence of an acute emergency medical condition and the patient appears stable for discharge with appropriate outpatient follow up. Diagnosis was discussed with patient who verbalizes understanding of care plan and is agreeable to discharge. I have discussed return precautions with patient who verbalizes understanding. Patient encouraged to follow-up with their PCP. All questions answered.  Patient's case discussed with Dr. Jeraldine Loots who agrees with plan to discharge with follow-up.   Discussed smoking cessation with patient and was they were offerred resources to help stop.  Total time was 5 min CPT code 51761.   Patrice Moates was evaluated in Emergency Department on 10/13/2020 for the symptoms described in the history of present illness. He was evaluated in the context of the global COVID-19 pandemic, which necessitated consideration that the patient might be at risk for infection with the SARS-CoV-2 virus that causes COVID-19. Institutional protocols  and algorithms that pertain to the evaluation of patients at risk for COVID-19 are in a state of rapid change based on information released by regulatory bodies including the CDC and federal and state organizations. These policies and algorithms were followed during the patient's  care in the ED.   Note: Portions of this report may have been transcribed using voice recognition software. Every effort was made to ensure accuracy; however, inadvertent computerized transcription errors may still be present. Final Clinical Impression(s) / ED Diagnoses Final diagnoses:  COPD exacerbation (HCC)  URI with cough and congestion  Encounter for laboratory testing for COVID-19 virus  Elevated blood pressure reading    Rx / DC Orders ED Discharge Orders         Ordered    azithromycin (ZITHROMAX) 250 MG tablet  Daily        10/13/20 1525           Elizabeth Palau 10/13/20 1530    Gerhard Munch, MD 10/14/20 [phone]

## 2020-10-13 NOTE — Discharge Instructions (Signed)
At this time there does not appear to be the presence of an emergent medical condition, however there is always the potential for conditions to change. Please read and follow the below instructions.  Please return to the Emergency Department immediately for any new or worsening symptoms or if your symptoms do not improve within 3 days. Please be sure to follow up with your Primary Care Provider within one week regarding your visit today; please call their office to schedule an appointment even if you are feeling better for a follow-up visit. Your Covid test is currently in process and should result in around 24 hours.  Please check your MyChart account for those results and discuss them with your primary care provider when available. Please attempt to stop smoking as this is likely contributing to your symptoms. Please take your antibiotic Azithromycin as prescribed until complete to help with your symptoms.  Please drink enough water to avoid dehydration and get plenty of rest. Your blood pressure was elevated in the emergency department today.  Please have it rechecked by your primary care provider at your follow-up visit this week and discuss medication management with them at that time.  Go to the nearest Emergency Department immediately if: You have fever or chills You have chest pain You have shortness of breath while resting. You have shortness of breath that stops you from: Being able to talk. Doing normal activities. Your skin color is more blue than usual. Your pulse oximeter shows that you have low oxygen for longer than 5 minutes. You have a fever. You feel too tired to breathe normally. You have very bad or constant: Headache. Ear pain. Pain in your forehead, behind your eyes, and over your cheekbones (sinus pain). You have long-lasting (chronic) lung disease along with any of these: Wheezing. Long-lasting cough. Coughing up blood. A change in your usual mucus. You have a  stiff neck. You have changes in your: Vision. Hearing. Thinking. Mood. You have any new/concerning or worsening of symptoms   Please read the additional information packets attached to your discharge summary.  Do not take your medicine if  develop an itchy rash, swelling in your mouth or lips, or difficulty breathing; call 911 and seek immediate emergency medical attention if this occurs.  You may review your lab tests and imaging results in their entirety on your MyChart account.  Please discuss all results of fully with your primary care provider and other specialist at your follow-up visit.  Note: Portions of this text may have been transcribed using voice recognition software. Every effort was made to ensure accuracy; however, inadvertent computerized transcription errors may still be present.

## 2020-10-13 NOTE — ED Triage Notes (Signed)
patient c/o chest congestion, cough, and SOB x 2 weeks.

## 2020-11-13 IMAGING — DX DG CHEST 1V PORT
1 series · 1 of 1 positions shown · non-contrast
Comparison: January 29, 2017

CLINICAL DATA: Seizure.

EXAM:
PORTABLE CHEST 1 VIEW

[chest]
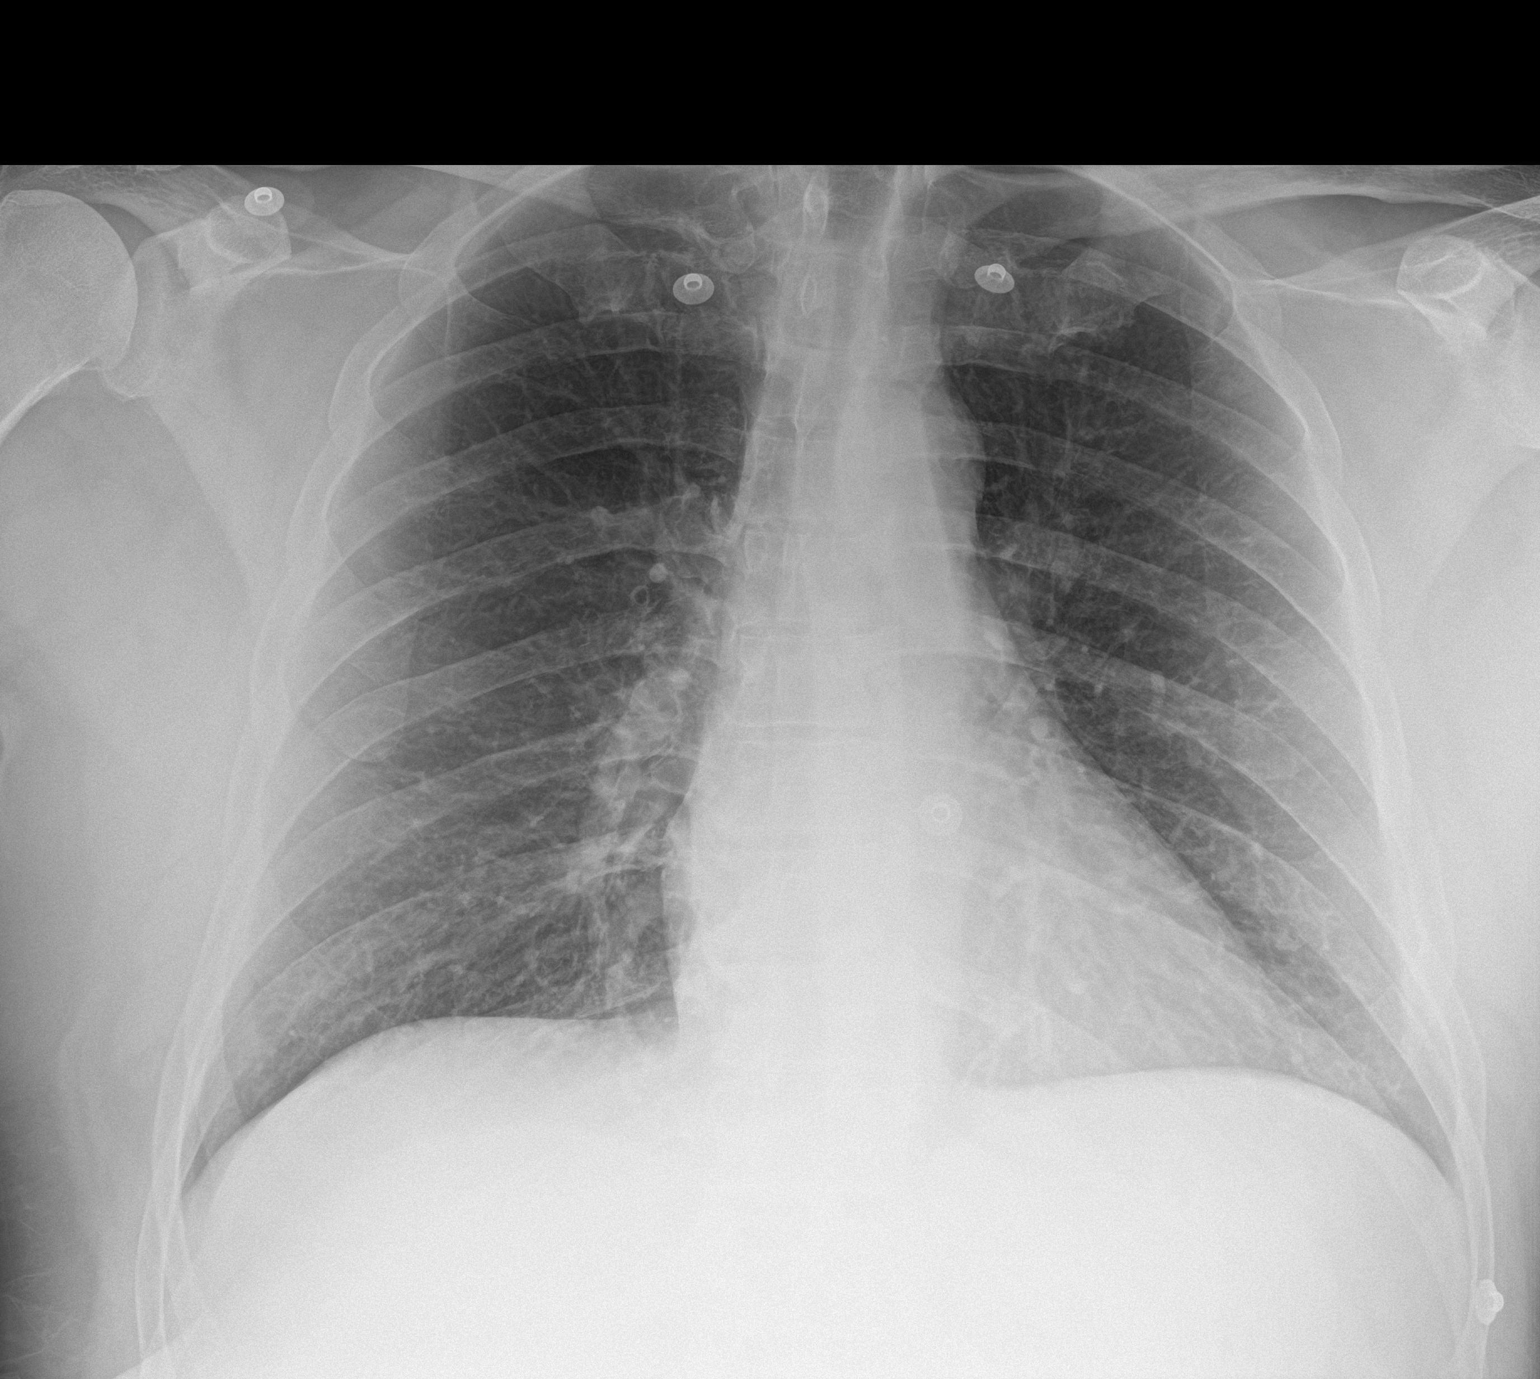

[1 of 1 positions shown; findings below may reference images not displayed]

FINDINGS: The heart size and mediastinal contours are within normal limits.
Both lungs are clear. The visualized skeletal structures are
unremarkable.
IMPRESSION: No active disease.

## 2021-01-27 ENCOUNTER — Telehealth: Payer: Self-pay | Admitting: Neurology

## 2021-01-27 MED ORDER — LEVETIRACETAM 500 MG PO TABS
500.0000 mg | ORAL_TABLET | Freq: Two times a day (BID) | ORAL | 0 refills | Status: DC
Start: 2021-01-27 — End: 2021-02-24

## 2021-01-27 NOTE — Addendum Note (Signed)
Addended by: Bertram Savin on: 01/27/2021 01:59 PM   Modules accepted: Orders

## 2021-01-27 NOTE — Telephone Encounter (Addendum)
Bethany - Pt needs a refill for Kepra to hold him over until his next visit. He just paid an open balance(I assume he called and spoke to Angie and they said he had to pay before reschedule) and just got new insurance so he wants to schedule a visit with Dr. Lucia Gaskins. Is it possible to do that for him? Also he just got a new job so is it possible to get something in writing such as a letter advising his employer that he is able to work. He stated that Dr. Lucia Gaskins never said he couldn't but his HR dept is requesting. 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 He just completed 90 day orientation and he would need ASAP if possible so he can go back to work.  Can you call him on his mobile, he would like to pick up the letter if possible once ready. If it can't be done will you also let him know.  Thank you  Appt made 02/24/2021 and Pt would like script called into [facility] on [facility]. [phone]

## 2021-01-27 NOTE — Telephone Encounter (Signed)
Per Dr. Daisy Blossom, we have not seen him in over a year and we cannot write a letter for him.  He was see Amy on June 7.  I sent in a 1 month prescription refill of Keppra to [facility] on [address] ([place]).

## 2021-02-23 NOTE — Patient Instructions (Addendum)
Below is our plan:  We will continue levetiracetam 500mg  twice daily. Please have your employer send me paperwork telling me what your job duties are. They can fax to [phone]. I will fill that out based on your job duties. Please make sure you are taking your medications twice daily every day. Please remember the importance of avoiding regular use of alcohol or binge drinking.   Please make sure you are staying well hydrated. I recommend 50-60 ounces daily. Well balanced diet and regular exercise encouraged. Consistent sleep schedule with 6-8 hours recommended.   Please continue follow up with care team as directed.   Follow up with me in 3 months.   You may receive a survey regarding today's visit. I encourage you to leave honest feed back as I do use this information to improve patient care. Thank you for seeing me today!      Seizure, Adult A seizure is a sudden burst of abnormal electrical and chemical activity in the brain. Seizures usually last from 30 seconds to 2 minutes.  What are the causes? Common causes of this condition include:  Fever or infection.  Problems that affect the brain. These may include: ? A brain or head injury. ? Bleeding in the brain. ? A brain tumor.  Low levels of blood sugar or salt.  Kidney problems or liver problems.  Conditions that are passed from parent to child (are inherited).  Problems with a substance, such as: ? Having a reaction to a drug or a medicine. ? Stopping the use of a substance all of a sudden (withdrawal).  A stroke.  Disorders that affect how you develop. Sometimes, the cause may not be known.  What increases the risk?  Having someone in your family who has epilepsy. In this condition, seizures happen again and again over time. They have no clear cause.  Having had a tonic-clonic seizure before. This type of seizure causes you to: ? Tighten the muscles of the whole body. ? Lose consciousness.  Having had a  head injury or strokes before.  Having had a lack of oxygen at birth. What are the signs or symptoms? There are many types of seizures. The symptoms vary depending on the type of seizure you have. Symptoms during a seizure  Shaking that you cannot control (convulsions) with fast, jerky movements of muscles.  Stiffness of the body.  Breathing problems.  Feeling mixed up (confused).  Staring or not responding to sound or touch.  Head nodding.  Eyes that blink, flutter, or move fast.  Drooling, grunting, or making clicking sounds with your mouth  Losing control of when you pee or poop. Symptoms before a seizure  Feeling afraid, nervous, or worried.  Feeling like you may vomit.  Feeling like: ? You are moving when you are not. ? Things around you are moving when they are not.  Feeling like you saw or heard something before (dj vu).  Odd tastes or smells.  Changes in how you see. You may see flashing lights or spots. Symptoms after a seizure  Feeling confused.  Feeling sleepy.  Headache.  Sore muscles. How is this treated? If your seizure stops on its own, you will not need treatment. If your seizure lasts longer than 5 minutes, you will normally need treatment. Treatment may include:  Medicines given through an IV tube.  Avoiding things, such as medicines, that are known to cause your seizures.  Medicines to prevent seizures.  A device to prevent or control  seizures.  Surgery.  A diet low in carbohydrates and high in fat (ketogenic diet). Follow these instructions at home: Medicines  Take over-the-counter and prescription medicines only as told by your doctor.  Avoid foods or drinks that may keep your medicine from working, such as alcohol. Activity  Follow instructions about driving, swimming, or doing things that would be dangerous if you had another seizure. Wait until your doctor says it is safe for you to do these things.  If you live in the  [place] ask your local department of motor vehicles when you can drive.  Get a lot of rest. Teaching others  Teach friends and family what to do when you have a seizure. They should: ? Help you get down to the ground. ? Protect your head and body. ? Loosen any clothing around your neck. ? Turn you on your side. ? Know whether or not you need emergency care. ? Stay with you until you are better.  Also, tell them what not to do if you have a seizure. Tell them: ? They should not hold you down. ? They should not put anything in your mouth.   General instructions  Avoid anything that gives you seizures.  Keep a seizure diary. Write down: ? What you remember about each seizure. ? What you think caused each seizure.  Keep all follow-up visits. Contact a doctor if:  You have another seizure or seizures. Call the doctor each time you have a seizure.  The pattern of your seizures changes.  You keep having seizures with treatment.  You have symptoms of being sick or having an infection.  You are not able to take your medicine. Get help right away if:  You have any of these problems: ? A seizure that lasts longer than 5 minutes. ? Many seizures in a row and you do not feel better between seizures. ? A seizure that makes it harder to breathe. ? A seizure and you can no longer speak or use part of your body.  You do not wake up right after a seizure.  You get hurt during a seizure.  You feel confused or have pain right after a seizure. These symptoms may be an emergency. Get help right away. Call your local emergency services (911 in the [place]  Do not wait to see if the symptoms will go away.  Do not drive yourself to the hospital. Summary  A seizure is a sudden burst of abnormal electrical and chemical activity in the brain. Seizures normally last from 30 seconds to 2 minutes.  Causes of seizures include illness, injury to the head, low levels of blood sugar or salt, and  certain conditions.  Most seizures will stop on their own in less than 5 minutes. Seizures that last longer than 5 minutes are a medical emergency and need treatment right away.  Many medicines are used to treat seizures. Take over-the-counter and prescription medicines only as told by your doctor. This information is not intended to replace advice given to you by your health care provider. Make sure you discuss any questions you have with your health care provider. Document Revised: 03/14/2020 Document Reviewed: 03/14/2020 Elsevier Patient Education  2021 [employer].

## 2021-02-23 NOTE — Progress Notes (Addendum)
Chief Complaint  Patient presents with  . Follow-up    RM 1, alone. SZ f/u, reports doing well w no sz like activity     HISTORY OF PRESENT ILLNESS: 02/24/21 ALL:  Lucas Williams is a 45 y.o. male here today for follow up for epilepsy. He was seen in consult with Dr Lucia GaskinsAhern in 10/2019. He did not return for follow up. Call from his sister in 12/2019 stating that patient was kicked out of her home for binge drinking and erratic behaviors. Sister claimed be was non compliant with AED. Since, he reports that he has been doing well. Initially he states that he has not had a seizure in 2 years. Review of chart shows he was seen in the ER at local hospital for seizure activity 06/01/2020. He admits that he has not taken levetiracetam for several months. He came in to request an appt in May and refills were sent in. He states that he has taken levetiractam consistently, since.   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 He is unable to tell me the name of the factory. He reports that it is in [place]. He reports that he "forgot" to tell them he had seizures. About 2 months ago, he became dizzy and was pulled from the line by his boss. He states that he was sent to the factory nurse and sent home. He lost his job. 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 He is hoping to be considered for rehire at [employer]. He is requesting paperwork stating that he is able to work without restrictions. He reports that he does not drive. He states that he drinks on occasion. He reports having two beers last night.    HISTORY (copied from Dr Trevor MaceAhern's previous note)  HPI:  Lucas Williams is a 45 y.o. male here as requested by No ref. provider found for seizures.  Patient's been to the emergency room twice, reviewed records, the first time was May 30, 2019 was in the setting of dehydration and illness, the second time he went in November he had missed multiple doses and again he was  prescribed and encouraged to take the Keppra, he has had imaging including CT of the head.. Today he is here alone.  He reports am PMHx TBI and alcohol use but now he only has 3 beers a week. He has head trauma 3-4 years ago but didn't star having seizures until 6 months ago. No inciting events. He work's at [employer], he is uninsured, he reports he had a seizure, complete loss of consciousness, when he woke up he was told by an EMT that he had a seizure. He was on the floor making noises, he does not know what happened he was told he moved and he was making a lot of noises, bit tongue very badly, brought him tot he ED 05/2019  it was in the setting of nausea, vomiting, diarrhea. He was given a prescription for Keppra but he did not take it, he reports he could not afford it it was several $100 at the pharmacy and he did not take it. In November had another seizure, went tot he ED, he had missed a dose per ED report but patient states today he may not have even started taking it so unclear, similar loss of consciousness, generalized body shaking. He had a seizure last Sunday, has not been taking the Keppra beause of financial reasons, he has  been only taking 250mg  a day. He is uninsured.  He denies any significant alcohol use.  He denies any inciting events to the seizures he does not know why this is happening, he denies any drug use, he did have head injury several years ago but unclear why he started having seizures 6 months ago, he does endorse tongue biting, the seizures last a few minutes and that he is postictal afterwards, no history of seizures in his past, no family history of seizures, no weakness, no focal deficits.No other focal neurologic deficits, associated symptoms, inciting events or modifiable factors.  Reviewed notes, labs and imaging from outside physicians, which showed:  Personally reviewed CT of the head October 28, 2019, May 28, 2019, agree with the following  2. 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  08/05/2019 alcohol was elevated at 21 10/28/2019 CBC showed elevated white blood cells, BMP showed decreased chloride, decreased CO2, elevated glucose, increased anion gap May 28, 2019 rapid urine drug screen was negative May 28, 2019 lactic acid initially elevated 2.4 then normalized to 1.4 August 27, 2019 ethanol normal   REVIEW OF SYSTEMS: Out of a complete 14 system review of symptoms, the patient complains only of the following symptoms, seizures, and all other reviewed systems are negative.   ALLERGIES: No Known Allergies   HOME MEDICATIONS: Outpatient Medications Prior to Visit  Medication Sig Dispense Refill  . ibuprofen (ADVIL) 600 MG tablet Take 1 tablet (600 mg total) by mouth every 6 (six) hours as needed. 30 tablet 0  . Multiple Vitamin (MULTIVITAMIN ADULT) TABS Take 1 tablet by mouth daily.    . Omega-3 Fatty Acids (FISH OIL) 1200 MG CAPS Take 1,000 mg by mouth daily.    6 December azithromycin (ZITHROMAX) 250 MG tablet Take 1 tablet (250 mg total) by mouth daily. Take first 2 tablets together, then 1 every day until finished. 6 tablet 0  . levETIRAcetam (KEPPRA) 500 MG tablet Take 1 tablet (500 mg total) by mouth 2 (two) times daily. 60 tablet 0   No facility-administered medications prior to visit.     PAST MEDICAL HISTORY: Past Medical History:  Diagnosis Date  . Laceration of hand, left   . Seizures (HCC)      PAST SURGICAL HISTORY: Past Surgical History:  Procedure Laterality Date  . FINGER SURGERY     r/t staph infection I&D, hand lac cutting trees  . HERNIA REPAIR     LT ihr  . NERVE REPAIR Left 12/11/2012   Procedure: Left Hand Exploration Wound with Nerve Repair;  Surgeon: 12/13/2012, MD;  Location:  SUR[facility]ERY [facility];  Service: Orthopedics;  Laterality: Left;     FAMILY HISTORY: Family History  Problem Relation Age of Onset   . Seizures Neg Hx      SOCIAL HISTORY: Social History   Socioeconomic History  . Marital status: Single    Spouse name: Not on file  . Number of children: 2  . Years of education: Not on file  . Highest education level: High school graduate  Occupational History  . Not on file  Tobacco Use  . Smoking status: Current Every Day Smoker    Packs/day: 0.35  . Smokeless tobacco: Never Used  . Tobacco comment: trying to decrease  Vaping Use  . Vaping Use: Former  Substance and Sexual Activity  . Alcohol use: Yes    Alcohol/week: 6.0 standard drinks    Types: 4 Cans  of beer, 2 Shots of liquor per week    Comment: weekly; update 11/06/19 pt states he cut down on the amount he drinks  . Drug use: No  . Sexual activity: Not Currently  Other Topics Concern  . Not on file  Social History Narrative   Lives with his sister   Right handed   Caffeine: mtn dew about a bottle a day, maybe 1 cup of coffee daily   Drinks lots of water   Social Determinants of Health   Financial Resource Strain: Not on file  Food Insecurity: Not on file  Transportation Needs: Not on file  Physical Activity: Not on file  Stress: Not on file  Social Connections: Not on file  Intimate Partner Violence: Not on file      PHYSICAL EXAM  Vitals:   02/24/21 0752  BP: (!) 136/96  Pulse: 66  Weight: 241 lb (109.3 kg)  Height: 6' (1.829 m)   Body mass index is 32.69 kg/m.   Generalized: Well developed, in no acute distress  Cardiology: normal rate and rhythm, no murmur auscultated  Respiratory: clear to auscultation bilaterally    Neurological examination  Mentation: Alert, poor historian, oriented to time, place, history taking. Follows all commands speech and language fluent Cranial nerve II-XII: Eyes are glassy, Pupils were equal round reactive to light. Extraocular movements were full, visual field were full on confrontational test. Facial sensation and strength were normal. Head turning and  shoulder shrug  were normal and symmetric. Motor: The motor testing reveals 5 over 5 strength of all 4 extremities. Good symmetric motor tone is noted throughout.  Sensory: Sensory testing is intact to soft touch on all 4 extremities. No evidence of extinction is noted.  Coordination: Cerebellar testing reveals ataxic finger-nose-finger  Gait and station: Gait is normal.     DIAGNOSTIC DATA (LABS, IMAGING, TESTING) - I reviewed patient records, labs, notes, testing and imaging myself where available.  Lab Results  Component Value Date   WBC 8.9 08/16/2020   HGB 15.3 08/16/2020   HCT 44.9 08/16/2020   MCV 93.3 08/16/2020   PLT 188 08/16/2020      Component Value Date/Time   NA 138 08/16/2020 1548   K 4.2 08/16/2020 1548   CL 105 08/16/2020 1548   CO2 23 08/16/2020 1548   GLUCOSE 89 08/16/2020 1548   BUN 13 08/16/2020 1548   CREATININE 0.83 08/16/2020 1548   CALCIUM 9.1 08/16/2020 1548   PROT 7.6 06/01/2020 2106   ALBUMIN 3.8 06/01/2020 2106   AST 152 (H) 06/01/2020 2106   ALT 218 (H) 06/01/2020 2106   ALKPHOS 80 06/01/2020 2106   BILITOT 0.7 06/01/2020 2106   GFRNONAA >60 08/16/2020 1548   GFRAA >60 06/01/2020 2106   No results found for: CHOL, HDL, LDLCALC, LDLDIRECT, TRIG, CHOLHDL No results found for: BOFB5Z No results found for: VITAMINB12 Lab Results  Component Value Date   TSH 1.668 08/16/2020    No flowsheet data found.   No flowsheet data found.   ASSESSMENT AND PLAN  45 y.o. year old male  has a past medical history of Laceration of hand, left and Seizures (HCC). here with     Focal epilepsy with impairment of consciousness (HCC)  Lucas Williams reports that since May he has taken levetiracetam 500mg  twice daily. I have significant concerns of non compliance and possible continued alcohol abuse. His eyes are glassy in the office, he is having a hard time providing details of employment and is unclear  of last seizure event. I have expressed concerns in the  office. I have explained the importance of medication compliance and avoidance of alcohol. I have advised that he continue levetiracetam 500mg  twie daily. I have called in refills for 3 months. Seizure precautions reviewed. I advise that he not drive. He was instructed to have human resources for his employer send me a list of specific job duties so that I may assess safety and make recommendations for any needed accomodations. I have asked that he return in 3 months for follow up. He verbalizes understanding and agreement with this plan.   No orders of the defined types were placed in this encounter.    Meds ordered this encounter  Medications  . levETIRAcetam (KEPPRA) 500 MG tablet    Sig: Take 1 tablet (500 mg total) by mouth 2 (two) times daily.    Dispense:  60 tablet    Refill:  2    Jaziel Bennett, MSN, FNP-C 02/24/2021, 8:29 AM  [facility] [address], [address] [place], [place] [phone]

## 2021-02-24 ENCOUNTER — Ambulatory Visit (INDEPENDENT_AMBULATORY_CARE_PROVIDER_SITE_OTHER): Payer: Self-pay | Admitting: Family Medicine

## 2021-02-24 ENCOUNTER — Encounter: Payer: Self-pay | Admitting: Family Medicine

## 2021-02-24 VITALS — BP 136/96 | HR 66 | Ht 72.0 in | Wt 241.0 lb

## 2021-02-24 DIAGNOSIS — G40209 Localization-related (focal) (partial) symptomatic epilepsy and epileptic syndromes with complex partial seizures, not intractable, without status epilepticus: Secondary | ICD-10-CM

## 2021-02-24 MED ORDER — LEVETIRACETAM 500 MG PO TABS: 500.0000 mg | ORAL_TABLET | Freq: Two times a day (BID) | ORAL | 2 refills | Status: DC

## 2021-04-10 ENCOUNTER — Emergency Department (HOSPITAL_COMMUNITY): Payer: Self-pay

## 2021-04-10 ENCOUNTER — Inpatient Hospital Stay (HOSPITAL_COMMUNITY)
Admission: EM | Admit: 2021-04-10 | Discharge: 2021-04-11 | DRG: 100 | Discharge status: Against Medical Advice | Payer: Self-pay | Attending: Internal Medicine | Admitting: Internal Medicine

## 2021-04-10 ENCOUNTER — Other Ambulatory Visit: Payer: Self-pay

## 2021-04-10 ENCOUNTER — Inpatient Hospital Stay (HOSPITAL_COMMUNITY): Payer: Self-pay

## 2021-04-10 DIAGNOSIS — R569 Unspecified convulsions: Secondary | ICD-10-CM

## 2021-04-10 DIAGNOSIS — F1010 Alcohol abuse, uncomplicated: Secondary | ICD-10-CM | POA: Diagnosis present

## 2021-04-10 DIAGNOSIS — R7401 Elevation of levels of liver transaminase levels: Secondary | ICD-10-CM | POA: Diagnosis present

## 2021-04-10 DIAGNOSIS — Z5329 Procedure and treatment not carried out because of patient's decision for other reasons: Secondary | ICD-10-CM | POA: Diagnosis present

## 2021-04-10 DIAGNOSIS — F17210 Nicotine dependence, cigarettes, uncomplicated: Secondary | ICD-10-CM | POA: Diagnosis present

## 2021-04-10 DIAGNOSIS — J690 Pneumonitis due to inhalation of food and vomit: Secondary | ICD-10-CM | POA: Diagnosis present

## 2021-04-10 DIAGNOSIS — G40909 Epilepsy, unspecified, not intractable, without status epilepticus: Principal | ICD-10-CM | POA: Diagnosis present

## 2021-04-10 DIAGNOSIS — Z20822 Contact with and (suspected) exposure to covid-19: Secondary | ICD-10-CM | POA: Diagnosis present

## 2021-04-10 DIAGNOSIS — T17908A Unspecified foreign body in respiratory tract, part unspecified causing other injury, initial encounter: Secondary | ICD-10-CM | POA: Diagnosis present

## 2021-04-10 DIAGNOSIS — J441 Chronic obstructive pulmonary disease with (acute) exacerbation: Secondary | ICD-10-CM | POA: Diagnosis present

## 2021-04-10 DIAGNOSIS — Z79899 Other long term (current) drug therapy: Secondary | ICD-10-CM

## 2021-04-10 DIAGNOSIS — J9601 Acute respiratory failure with hypoxia: Secondary | ICD-10-CM | POA: Diagnosis present

## 2021-04-10 DIAGNOSIS — F10129 Alcohol abuse with intoxication, unspecified: Secondary | ICD-10-CM | POA: Diagnosis present

## 2021-04-10 LAB — COMPREHENSIVE METABOLIC PANEL
ALT: 156 U/L — ABNORMAL HIGH (ref 0–44)
AST: 113 U/L — ABNORMAL HIGH (ref 15–41)
Albumin: 3.9 g/dL (ref 3.5–5.0)
Alkaline Phosphatase: 88 U/L (ref 38–126)
Anion gap: 18 — ABNORMAL HIGH (ref 5–15)
BUN: 16 mg/dL (ref 6–20)
CO2: 16 mmol/L — ABNORMAL LOW (ref 22–32)
Calcium: 8.7 mg/dL — ABNORMAL LOW (ref 8.9–10.3)
Chloride: 111 mmol/L (ref 98–111)
Creatinine, Ser: 1.07 mg/dL (ref 0.61–1.24)
GFR, Estimated: >60 mL/min (ref >60)
Glucose, Bld: 128 mg/dL — ABNORMAL HIGH (ref 70–99)
Potassium: 3.5 mmol/L (ref 3.5–5.1)
Sodium: 145 mmol/L (ref 135–145)
Total Bilirubin: 0.6 mg/dL (ref 0.3–1.2)
Total Protein: 7.8 g/dL (ref 6.5–8.1)

## 2021-04-10 LAB — CBC WITH DIFFERENTIAL/PLATELET
Abs Immature Granulocytes: 0.17 10*3/uL — ABNORMAL HIGH (ref 0.00–0.07)
Basophils Absolute: 0.1 10*3/uL (ref 0.0–0.1)
Basophils Relative: 1 %
Eosinophils Absolute: 0 10*3/uL (ref 0.0–0.5)
Eosinophils Relative: 0 %
HCT: 47.6 % (ref 39.0–52.0)
Hemoglobin: 15.2 g/dL (ref 13.0–17.0)
Immature Granulocytes: 1 %
Lymphocytes Relative: 18 %
Lymphs Abs: 3.3 10*3/uL (ref 0.7–4.0)
MCH: 31.9 pg (ref 26.0–34.0)
MCHC: 31.9 g/dL (ref 30.0–36.0)
MCV: 99.8 fL (ref 80.0–100.0)
Monocytes Absolute: 1.4 10*3/uL — ABNORMAL HIGH (ref 0.1–1.0)
Monocytes Relative: 8 %
Neutro Abs: 13.6 10*3/uL — ABNORMAL HIGH (ref 1.7–7.7)
Neutrophils Relative %: 72 %
Platelets: 227 10*3/uL (ref 150–400)
RBC: 4.77 MIL/uL (ref 4.22–5.81)
RDW: 14.1 % (ref 11.5–15.5)
WBC: 18.6 10*3/uL — ABNORMAL HIGH (ref 4.0–10.5)
nRBC: 0 % (ref 0.0–0.2)

## 2021-04-10 LAB — BLOOD GAS, VENOUS
Acid-base deficit: 20.7 mmol/L — ABNORMAL HIGH (ref 0.0–2.0)
Acid-base deficit: 6.6 mmol/L — ABNORMAL HIGH (ref 0.0–2.0)
Bicarbonate: 11.9 mmol/L — ABNORMAL LOW (ref 20.0–28.0)
Bicarbonate: 18.2 mmol/L — ABNORMAL LOW (ref 20.0–28.0)
O2 Saturation: 97.6 %
O2 Saturation: 70.6 %
Patient temperature: 98.6
Patient temperature: 98.6
pCO2, Ven: 54.8 mmHg (ref 44.0–60.0)
pCO2, Ven: 35.7 mmHg — ABNORMAL LOW (ref 44.0–60.0)
pH, Ven: 6.967 — CL (ref 7.250–7.430)
pH, Ven: 7.327 (ref 7.250–7.430)
pO2, Ven: 194 mmHg — ABNORMAL HIGH (ref 32.0–45.0)
pO2, Ven: 46.9 mmHg — ABNORMAL HIGH (ref 32.0–45.0)

## 2021-04-10 LAB — ETHANOL: Alcohol, Ethyl (B): <10 mg/dL (ref <10)

## 2021-04-10 LAB — MAGNESIUM: Magnesium: 2.5 mg/dL — ABNORMAL HIGH (ref 1.7–2.4)

## 2021-04-10 LAB — RESP PANEL BY RT-PCR (FLU A&B, COVID) ARPGX2
Influenza A by PCR: NEGATIVE
Influenza B by PCR: NEGATIVE
SARS Coronavirus 2 by RT PCR: NEGATIVE

## 2021-04-10 LAB — TROPONIN I (HIGH SENSITIVITY): Troponin I (High Sensitivity): 8 ng/L (ref <18)

## 2021-04-10 LAB — CBG MONITORING, ED: Glucose-Capillary: 122 mg/dL — ABNORMAL HIGH (ref 70–99)

## 2021-04-10 MED ORDER — LORAZEPAM 2 MG/ML IJ SOLN
1.0000 mg | Freq: Once | INTRAMUSCULAR | Status: AC
  Administered 2021-04-10: 1 mg via INTRAVENOUS

## 2021-04-10 MED ORDER — LORAZEPAM 2 MG/ML IJ SOLN
INTRAMUSCULAR | Status: AC
  Administered 2021-04-10: 1 mg via INTRAVENOUS
  Filled 2021-04-10: qty 1

## 2021-04-10 MED ORDER — LIDOCAINE VISCOUS HCL 2 % MT SOLN
15.0000 mL | Freq: Once | OROMUCOSAL | Status: AC
  Administered 2021-04-11: 15 mL via OROMUCOSAL
  Filled 2021-04-10: qty 15

## 2021-04-10 MED ORDER — ACETAMINOPHEN 325 MG PO TABS: 650.0000 mg | ORAL_TABLET | Freq: Four times a day (QID) | ORAL | Status: DC | PRN

## 2021-04-10 MED ORDER — FOLIC ACID 1 MG PO TABS
1.0000 mg | ORAL_TABLET | Freq: Every day | ORAL | Status: DC
  Administered 2021-04-11: 1 mg via ORAL
  Filled 2021-04-10: qty 1

## 2021-04-10 MED ORDER — ACETAMINOPHEN 650 MG RE SUPP: 650.0000 mg | Freq: Four times a day (QID) | RECTAL | Status: DC | PRN

## 2021-04-10 MED ORDER — ALBUTEROL (5 MG/ML) CONTINUOUS INHALATION SOLN
10.0000 mg/h | INHALATION_SOLUTION | RESPIRATORY_TRACT | Status: DC
  Administered 2021-04-10: 10 mg/h via RESPIRATORY_TRACT
  Filled 2021-04-10: qty 20

## 2021-04-10 MED ORDER — MOMETASONE FURO-FORMOTEROL FUM 200-5 MCG/ACT IN AERO
2.0000 | INHALATION_SPRAY | Freq: Two times a day (BID) | RESPIRATORY_TRACT | Status: DC
  Filled 2021-04-10: qty 8.8

## 2021-04-10 MED ORDER — SODIUM CHLORIDE 0.9 % IV BOLUS
1000.0000 mL | Freq: Once | INTRAVENOUS | Status: AC
  Administered 2021-04-10: 1000 mL via INTRAVENOUS

## 2021-04-10 MED ORDER — LORAZEPAM 2 MG/ML IJ SOLN
1.0000 mg | Freq: Once | INTRAMUSCULAR | Status: AC
  Filled 2021-04-10: qty 1

## 2021-04-10 MED ORDER — METHYLPREDNISOLONE SODIUM SUCC 125 MG IJ SOLR
125.0000 mg | Freq: Once | INTRAMUSCULAR | Status: AC
  Administered 2021-04-10: 125 mg via INTRAVENOUS
  Filled 2021-04-10: qty 2

## 2021-04-10 MED ORDER — LEVETIRACETAM IN NACL 1500 MG/100ML IV SOLN
1500.0000 mg | Freq: Once | INTRAVENOUS | Status: AC
  Administered 2021-04-10: 1500 mg via INTRAVENOUS
  Filled 2021-04-10: qty 100

## 2021-04-10 MED ORDER — ALBUTEROL SULFATE (2.5 MG/3ML) 0.083% IN NEBU: 2.5000 mg | INHALATION_SOLUTION | RESPIRATORY_TRACT | Status: DC | PRN

## 2021-04-10 MED ORDER — ENOXAPARIN SODIUM 40 MG/0.4ML IJ SOSY
40.0000 mg | PREFILLED_SYRINGE | INTRAMUSCULAR | Status: DC
  Administered 2021-04-11: 40 mg via SUBCUTANEOUS
  Filled 2021-04-10: qty 0.4

## 2021-04-10 MED ORDER — LORAZEPAM 2 MG/ML IJ SOLN: 0.0000 mg | INTRAMUSCULAR | Status: DC

## 2021-04-10 MED ORDER — ONDANSETRON HCL 4 MG PO TABS: 4.0000 mg | ORAL_TABLET | Freq: Four times a day (QID) | ORAL | Status: DC | PRN

## 2021-04-10 MED ORDER — LORAZEPAM 2 MG/ML IJ SOLN: 1.0000 mg | INTRAMUSCULAR | Status: DC | PRN

## 2021-04-10 MED ORDER — ONDANSETRON HCL 4 MG/2ML IJ SOLN: 4.0000 mg | Freq: Four times a day (QID) | INTRAMUSCULAR | Status: DC | PRN

## 2021-04-10 MED ORDER — THIAMINE HCL 100 MG/ML IJ SOLN: 100.0000 mg | Freq: Every day | INTRAMUSCULAR | Status: DC

## 2021-04-10 MED ORDER — UMECLIDINIUM BROMIDE 62.5 MCG/INH IN AEPB
1.0000 | INHALATION_SPRAY | Freq: Every day | RESPIRATORY_TRACT | Status: DC
  Filled 2021-04-10: qty 7

## 2021-04-10 MED ORDER — IPRATROPIUM BROMIDE 0.02 % IN SOLN
0.5000 mg | Freq: Once | RESPIRATORY_TRACT | Status: AC
  Administered 2021-04-10: 0.5 mg via RESPIRATORY_TRACT
  Filled 2021-04-10: qty 2.5

## 2021-04-10 MED ORDER — LEVETIRACETAM IN NACL 500 MG/100ML IV SOLN
500.0000 mg | Freq: Two times a day (BID) | INTRAVENOUS | Status: DC
  Administered 2021-04-11: 500 mg via INTRAVENOUS
  Filled 2021-04-10: qty 100

## 2021-04-10 MED ORDER — THIAMINE HCL 100 MG PO TABS
100.0000 mg | ORAL_TABLET | Freq: Every day | ORAL | Status: DC
  Administered 2021-04-11: 100 mg via ORAL
  Filled 2021-04-10: qty 1

## 2021-04-10 MED ORDER — LORAZEPAM 1 MG PO TABS: 1.0000 mg | ORAL_TABLET | ORAL | Status: DC | PRN

## 2021-04-10 MED ORDER — LORAZEPAM 2 MG/ML IJ SOLN: 0.0000 mg | Freq: Three times a day (TID) | INTRAMUSCULAR | Status: DC

## 2021-04-10 MED ORDER — CEFTRIAXONE SODIUM 2 G IJ SOLR
2.0000 g | INTRAMUSCULAR | Status: DC
Start: 2021-04-11 — End: 2021-04-11
  Administered 2021-04-11: 2 g via INTRAVENOUS
  Filled 2021-04-10: qty 20

## 2021-04-10 MED ORDER — ADULT MULTIVITAMIN W/MINERALS CH
1.0000 | ORAL_TABLET | Freq: Every day | ORAL | Status: DC
  Administered 2021-04-11: 1 via ORAL
  Filled 2021-04-10: qty 1

## 2021-04-10 NOTE — Progress Notes (Signed)
RT was called in the room. RT placed pt back on the BIPAP. Pt very agitated  and pulled the mask off and the IV. BIPAP SB. Pt is on NRB and tolerating it well at this time. RN bedside. MD aware.

## 2021-04-10 NOTE — ED Triage Notes (Signed)
Patient came by EMS. EMS was called to the pts. Job the first time because he was having SOB. The second time it was called patient was on the ground and it was believed that he had a seizure. Patient is still experiencing SOB EMS stated 02 was 84% on RA. They put him on 15L non rebreather.  O2 went up to 91%.   CBG:135 210/110 HR: 120 20g Rhand

## 2021-04-10 NOTE — ED Provider Notes (Signed)
[facility]-EMERGENCY DEPT Provider Note   CSN: [id number] Arrival date & time: 04/10/21  1931     History Chief Complaint  Patient presents with   Seizures   Shortness of Breath    Lucas Williams is a 45 y.o. male.  The history is provided by the patient, the EMS personnel and medical records.  Seizures Shortness of Breath Lucas Williams is a 45 y.o. male who presents to the Emergency Department complaining of seizure, shortness of breath. Level V caveat due to confusion in respiratory distress. History is provided by patient and EMS. EMS reports that he was working outside today and initially there was a callout for heat illness. He refused EMS transfer that time. They were called out again a few hours later for seizure. Per report he had a generalized tonic clonic seizure. On their arrival he was conversant but confused. He complained of severe chest pain. EMS reports decreased breath sounds on the left side. On ED arrival patient reports severe chest pain, difficulty breathing. He does state compliance with his seizure medication. He does smoke tobacco, drinks occasional alcohol.    Past Medical History:  Diagnosis Date   Laceration of hand, left    Seizures [facility](HCC)     Patient Active Problem List   Diagnosis Date Noted   COPD with acute exacerbation (HCC) 04/11/2021   Seizures (HCC) 04/10/2021   Aspiration into airway 04/10/2021   Transaminitis 04/10/2021   Acute respiratory failure with hypoxia (HCC) 04/10/2021   Substance induced mood disorder (HCC) 11/15/2019   Focal epilepsy with impairment of consciousness (HCC) 11/06/2019   Alcohol abuse 02/11/2017   Adjustment reaction with anxiety and depression 02/11/2017   Pulled muscle 02/11/2017   TBI (traumatic brain injury) (HCC) 01/29/2017    Past Surgical History:  Procedure Laterality Date   FINGER SURGERY     r/t staph infection I&D, hand lac cutting trees   HERNIA REPAIR     LT ihr   NERVE  REPAIR Left 12/11/2012   Procedure: Left Hand Exploration Wound with Nerve Repair;  Surgeon: Tami RibasKevin R Kuzma, MD;  Location: [facility];  Service: Orthopedics;  Laterality: Left;       Family History  Problem Relation Age of Onset   Seizures Neg Hx     Social History   Tobacco Use   Smoking status: Every Day    Packs/day: 0.35    Types: Cigarettes   Smokeless tobacco: Never   Tobacco comments:    trying to decrease  Vaping Use   Vaping Use: Former  Substance Use Topics   Alcohol use: Yes    Alcohol/week: 6.0 standard drinks    Types: 4 Cans of beer, 2 Shots of liquor per week    Comment: weekly; update 11/06/19 pt states he cut down on the amount he drinks   Drug use: No    Home Medications Prior to Admission medications   Medication Sig Start Date End Date Taking? Authorizing Provider  ibuprofen (ADVIL) 600 MG tablet Take 1 tablet (600 mg total) by mouth every 6 (six) hours as needed. 05/10/20   Elpidio AnisUpstill, Shari, PA-C  levETIRAcetam (KEPPRA) 500 MG tablet Take 1 tablet (500 mg total) by mouth 2 (two) times daily. 02/24/21   Lomax, Amy, NP  Multiple Vitamin (MULTIVITAMIN ADULT) TABS Take 1 tablet by mouth daily.    [provider]  Omega-3 Fatty Acids (FISH OIL) 1200 MG CAPS Take 1,000 mg by mouth daily.    [provider]    Allergies    Patient has no known allergies.  Review of Systems   Review of Systems  Respiratory:  Positive for shortness of breath.   Neurological:  Positive for seizures.  All other systems reviewed and are negative.  Physical Exam Updated Vital Signs BP (!) 170/93   Pulse 95   Temp (!) 96.8 F (36 C) (Rectal)   Resp (!) 36   SpO2 93%   Physical Exam Vitals and nursing note reviewed.  Constitutional:      General: He is in acute distress.     Appearance: He is well-developed. He is ill-appearing.  HENT:     Head: Normocephalic and atraumatic.  Cardiovascular:     Rate and Rhythm: Regular rhythm.  Tachycardia present.     Heart sounds: No murmur heard. Pulmonary:     Effort: Respiratory distress present.     Comments: Decreased air movement bilaterally with occasional wheezes Abdominal:     Palpations: Abdomen is soft.     Tenderness: There is no abdominal tenderness. There is no guarding or rebound.  Musculoskeletal:        General: No tenderness.  Skin:    General: Skin is warm and dry.  Neurological:     Mental Status: He is alert and oriented to person, place, and time.     Comments: Moves all extremities symmetrically. Slow to answer questions.  Psychiatric:        Behavior: Behavior normal.    ED Results / Procedures / Treatments   Labs (all labs ordered are listed, but only abnormal results are displayed) Labs Reviewed  COMPREHENSIVE METABOLIC PANEL - Abnormal; Notable for the following components:      Result Value   CO2 16 (*)    Glucose, Bld 128 (*)    Calcium 8.7 (*)    AST 113 (*)    ALT 156 (*)    Anion gap 18 (*)    All other components within normal limits  CBC WITH DIFFERENTIAL/PLATELET - Abnormal; Notable for the following components:   WBC 18.6 (*)    Neutro Abs 13.6 (*)    Monocytes Absolute 1.4 (*)    Abs Immature Granulocytes 0.17 (*)    All other components within normal limits  BLOOD GAS, VENOUS - Abnormal; Notable for the following components:   pH, Ven 6.967 (*)    pO2, Ven 194.0 (*)    Bicarbonate 11.9 (*)    Acid-base deficit 20.7 (*)    All other components within normal limits  MAGNESIUM - Abnormal; Notable for the following components:   Magnesium 2.5 (*)    All other components within normal limits  BLOOD GAS, VENOUS - Abnormal; Notable for the following components:   pCO2, Ven 35.7 (*)    pO2, Ven 46.9 (*)    Bicarbonate 18.2 (*)    Acid-base deficit 6.6 (*)    All other components within normal limits  CBG MONITORING, ED - Abnormal; Notable for the following components:   Glucose-Capillary 122 (*)    All other components  within normal limits  RESP PANEL BY RT-PCR (FLU A&B, COVID) ARPGX2  ETHANOL  PHOSPHORUS  HIV ANTIBODY (ROUTINE TESTING W REFLEX)  CBC  COMPREHENSIVE METABOLIC PANEL  LACTIC ACID, PLASMA  LACTIC ACID, PLASMA  TROPONIN I (HIGH SENSITIVITY)  TROPONIN I (HIGH SENSITIVITY)    EKG EKG Interpretation  Date/Time:  Friday April 10 2021 19:44:17 EDT Ventricular Rate:  106 PR Interval:  159 QRS  Duration: 109 QT Interval:  374 QTC Calculation: 497 R Axis:   -2 Text Interpretation: Sinus tachycardia ST elev, probable normal early repol pattern Borderline prolonged QT interval Confirmed by Tilden Fossa [phone]) on 04/10/2021 7:46:03 PM  Radiology DG Chest Port 1 View  Result Date: 04/10/2021 CLINICAL DATA:  Shortness of breath. EXAM: PORTABLE CHEST 1 VIEW COMPARISON:  Chest x-ray 10/13/2020. FINDINGS: Slightly more prominent cardiac silhouette likely due to positioning/technique. The heart size and mediastinal contours are unchanged. No focal consolidation. No pulmonary edema. No pleural effusion. No pneumothorax. No acute osseous abnormality. IMPRESSION: No active disease. Electronically Signed   By: Tish Frederickson M.D.   On: 04/10/2021 20:20    Procedures Procedures  CRITICAL CARE Performed by: Tilden Fossa   Total critical care time: 35 minutes  Critical care time was exclusive of separately billable procedures and treating other patients.  Critical care was necessary to treat or prevent imminent or life-threatening deterioration.  Critical care was time spent personally by me on the following activities: development of treatment plan with patient and/or surrogate as well as nursing, discussions with consultants, evaluation of patient's response to treatment, examination of patient, obtaining history from patient or surrogate, ordering and performing treatments and interventions, ordering and review of laboratory studies, ordering and review of radiographic studies, pulse oximetry  and re-evaluation of patient's condition.  Medications Ordered in ED Medications  albuterol (PROVENTIL,VENTOLIN) solution continuous neb (10 mg/hr Nebulization New Bag/Given 04/10/21 2002)  levETIRAcetam (KEPPRA) IVPB 500 mg/100 mL premix (has no administration in time range)  LORazepam (ATIVAN) tablet 1-4 mg (has no administration in time range)    Or  LORazepam (ATIVAN) injection 1-4 mg (has no administration in time range)  thiamine tablet 100 mg (has no administration in time range)    Or  thiamine (B-1) injection 100 mg (has no administration in time range)  folic acid (FOLVITE) tablet 1 mg (has no administration in time range)  multivitamin with minerals tablet 1 tablet (has no administration in time range)  LORazepam (ATIVAN) injection 0-4 mg (has no administration in time range)    Followed by  LORazepam (ATIVAN) injection 0-4 mg (has no administration in time range)  enoxaparin (LOVENOX) injection 40 mg (has no administration in time range)  acetaminophen (TYLENOL) tablet 650 mg (has no administration in time range)    Or  acetaminophen (TYLENOL) suppository 650 mg (has no administration in time range)  ondansetron (ZOFRAN) tablet 4 mg (has no administration in time range)    Or  ondansetron (ZOFRAN) injection 4 mg (has no administration in time range)  lidocaine (XYLOCAINE) 2 % viscous mouth solution 15 mL (has no administration in time range)  cefTRIAXone (ROCEPHIN) 2 g in sodium chloride 0.9 % 100 mL IVPB (has no administration in time range)  mometasone-formoterol (DULERA) 200-5 MCG/ACT inhaler 2 puff (has no administration in time range)  albuterol (PROVENTIL) (2.5 MG/3ML) 0.083% nebulizer solution 2.5 mg (has no administration in time range)  umeclidinium bromide (INCRUSE ELLIPTA) 62.5 MCG/INH 1 puff (has no administration in time range)  sodium chloride 0.9 % bolus 1,000 mL (1,000 mLs Intravenous New Bag/Given 04/10/21 2013)  ipratropium (ATROVENT) nebulizer solution 0.5  mg (0.5 mg Nebulization Given 04/10/21 2346)  methylPREDNISolone sodium succinate (SOLU-MEDROL) 125 mg/2 mL injection 125 mg (125 mg Intravenous Given 04/10/21 2003)  levETIRAcetam (KEPPRA) IVPB 1500 mg/ 100 mL premix (1,500 mg Intravenous New Bag/Given 04/10/21 2109)  LORazepam (ATIVAN) injection 1 mg (1 mg Intravenous Given 04/10/21 2111)  LORazepam (ATIVAN)  injection 1 mg (1 mg Intravenous Given 04/10/21 2111)    ED Course  I have reviewed the triage vital signs and the nursing notes.  Pertinent labs & imaging results that were available during my care of the patient were reviewed by me and considered in my medical decision making (see chart for details).    MDM Rules/Calculators/A&P                          patient with history of seizure disorder here for evaluation following seizure. On ED evaluation he was awake and alert with no focal neurologic deficits. He complained of severe chest pain with difficulty breathing. On examination patient with increased work of breathing, decreased air movement bilaterally and wheezes bilaterally. He was started on duo nebs with Solu-Medrol for possible COPD exacerbation. He was also started on BiPAP for respiratory support. Call to room later for seizure activity. BiPAP was probably removed. Patient did have some emesis. He was treated with Ativan, Reload for seizure. Seizure activity lasted about one minute. He did have a postictal period. He was observed for another hour in the emergency department with improvement in his mental status. He does have ongoing oxygen requirement of 3 L to maintain sats in the low 90s. Unclear if this is secondary to aspiration event versus COPD exacerbation. Plan to admit for ongoing treatment.  Final Clinical Impression(s) / ED Diagnoses Final diagnoses:  Seizure (HCC)  COPD exacerbation [facility])    Rx / DC Orders ED Discharge Orders     None        Tilden Fossa, MD 04/11/21 0004

## 2021-04-11 ENCOUNTER — Other Ambulatory Visit: Payer: Self-pay

## 2021-04-11 ENCOUNTER — Inpatient Hospital Stay (HOSPITAL_COMMUNITY): Payer: Self-pay

## 2021-04-11 DIAGNOSIS — J441 Chronic obstructive pulmonary disease with (acute) exacerbation: Secondary | ICD-10-CM | POA: Diagnosis present

## 2021-04-11 DIAGNOSIS — J9601 Acute respiratory failure with hypoxia: Secondary | ICD-10-CM

## 2021-04-11 DIAGNOSIS — R569 Unspecified convulsions: Secondary | ICD-10-CM

## 2021-04-11 DIAGNOSIS — F1010 Alcohol abuse, uncomplicated: Secondary | ICD-10-CM

## 2021-04-11 DIAGNOSIS — R7401 Elevation of levels of liver transaminase levels: Secondary | ICD-10-CM

## 2021-04-11 DIAGNOSIS — T17908A Unspecified foreign body in respiratory tract, part unspecified causing other injury, initial encounter: Secondary | ICD-10-CM

## 2021-04-11 LAB — CBC
HCT: 43.9 % (ref 39.0–52.0)
Hemoglobin: 15.2 g/dL (ref 13.0–17.0)
MCH: 32.1 pg (ref 26.0–34.0)
MCHC: 34.6 g/dL (ref 30.0–36.0)
MCV: 92.8 fL (ref 80.0–100.0)
Platelets: 209 10*3/uL (ref 150–400)
RBC: 4.73 MIL/uL (ref 4.22–5.81)
RDW: 13.9 % (ref 11.5–15.5)
WBC: 19.1 10*3/uL — ABNORMAL HIGH (ref 4.0–10.5)
nRBC: 0 % (ref 0.0–0.2)

## 2021-04-11 LAB — HIV ANTIBODY (ROUTINE TESTING W REFLEX): HIV Screen 4th Generation wRfx: NONREACTIVE

## 2021-04-11 LAB — TROPONIN I (HIGH SENSITIVITY): Troponin I (High Sensitivity): 10 ng/L (ref <18)

## 2021-04-11 LAB — COMPREHENSIVE METABOLIC PANEL
ALT: 144 U/L — ABNORMAL HIGH (ref 0–44)
AST: 95 U/L — ABNORMAL HIGH (ref 15–41)
Albumin: 3.7 g/dL (ref 3.5–5.0)
Alkaline Phosphatase: 72 U/L (ref 38–126)
Anion gap: 7 (ref 5–15)
BUN: 15 mg/dL (ref 6–20)
CO2: 23 mmol/L (ref 22–32)
Calcium: 8.2 mg/dL — ABNORMAL LOW (ref 8.9–10.3)
Chloride: 107 mmol/L (ref 98–111)
Creatinine, Ser: 0.93 mg/dL (ref 0.61–1.24)
GFR, Estimated: >60 mL/min (ref >60)
Glucose, Bld: 132 mg/dL — ABNORMAL HIGH (ref 70–99)
Potassium: 4 mmol/L (ref 3.5–5.1)
Sodium: 137 mmol/L (ref 135–145)
Total Bilirubin: 0.9 mg/dL (ref 0.3–1.2)
Total Protein: 7.4 g/dL (ref 6.5–8.1)

## 2021-04-11 LAB — MAGNESIUM: Magnesium: 2.2 mg/dL (ref 1.7–2.4)

## 2021-04-11 LAB — HEPATITIS PANEL, ACUTE
HCV Ab: REACTIVE — AB
Hep A IgM: NONREACTIVE
Hep B C IgM: NONREACTIVE
Hepatitis B Surface Ag: NONREACTIVE

## 2021-04-11 LAB — PHOSPHORUS: Phosphorus: 1.3 mg/dL — ABNORMAL LOW (ref 2.5–4.6)

## 2021-04-11 LAB — LACTIC ACID, PLASMA: Lactic Acid, Venous: 1.6 mmol/L (ref 0.5–1.9)

## 2021-04-11 MED ORDER — SODIUM CHLORIDE 0.9 % IV SOLN: 750.0000 mg | Freq: Two times a day (BID) | INTRAVENOUS | Status: DC

## 2021-04-11 MED ORDER — POTASSIUM PHOSPHATES 15 MMOLE/5ML IV SOLN: 30.0000 mmol | Freq: Once | INTRAVENOUS | Status: DC

## 2021-04-11 MED ORDER — PIPERACILLIN-TAZOBACTAM 3.375 G IVPB: 3.3750 g | Freq: Three times a day (TID) | INTRAVENOUS | Status: DC

## 2021-04-11 MED ORDER — PIPERACILLIN-TAZOBACTAM 3.375 G IVPB 30 MIN
3.3750 g | Freq: Once | INTRAVENOUS | Status: AC
  Administered 2021-04-11: 3.375 g via INTRAVENOUS
  Filled 2021-04-11: qty 50

## 2021-04-11 MED ORDER — PANTOPRAZOLE SODIUM 40 MG PO TBEC: 40.0000 mg | DELAYED_RELEASE_TABLET | Freq: Every day | ORAL | Status: DC

## 2021-04-11 MED ORDER — PREDNISONE 50 MG PO TABS
50.0000 mg | ORAL_TABLET | Freq: Every day | ORAL | Status: DC
  Administered 2021-04-11: 50 mg via ORAL
  Filled 2021-04-11: qty 1

## 2021-04-11 MED ORDER — SODIUM PHOSPHATES 45 MMOLE/15ML IV SOLN
30.0000 mmol | Freq: Once | INTRAVENOUS | Status: AC
  Administered 2021-04-11: 30 mmol via INTRAVENOUS
  Filled 2021-04-11: qty 10

## 2021-04-11 MED ORDER — SODIUM CHLORIDE 0.9 % IV SOLN
500.0000 mg | INTRAVENOUS | Status: DC
  Administered 2021-04-11: 500 mg via INTRAVENOUS
  Filled 2021-04-11: qty 500

## 2021-04-11 NOTE — Progress Notes (Signed)
Pharmacy Antibiotic Note  Lucas Williams is a 45 y.o. male admitted on 04/10/2021 with aspiration PNA.  Pharmacy has been consulted for Zosyn dosing.  Plan: Zosyn 3.375g IV q8h (4 hour infusion). Pharmacy to sign off   Temp (24hrs), Avg:97.7 F (36.5 C), Min:96.8 F (36 C), Max:98.1 F (36.7 C)  Recent Labs  Lab 04/10/21 2050 04/11/21 0130 04/11/21 0140  WBC 18.6*  --  19.1*  CREATININE 1.07  --  0.93  LATICACIDVEN  --  1.6  --     CrCl cannot be calculated (Unknown ideal weight.).    No Known Allergies  Thank you for allowing pharmacy to be a part of this patient's care.  Herby Abraham, Pharm.D 04/11/2021 4:51 PM

## 2021-04-11 NOTE — Evaluation (Signed)
Occupational Therapy Evaluation/Discharge Patient Details Name: Lucas Williams MRN: [id number] DOB: 1976-09-17 Today's Date: 04/11/2021    History of Present Illness Lucas Williams is a 44 y.o. male who presents to ED due to SOB and seizure. Another seizure occurred in ED while pt on BiPAP. PMH: seizures, EtOH abuse, chronic LFT elevations.   Clinical Impression   PTA, pt lives with roommate and reports Independence with all daily tasks. Pt works in [employer]. Pt presents now feeling close to baseline. Pt able to demo ADLs with supervision at most for safety, as well as hallway mobility without AD. No overt LOB or safety concerns noted. Pt with 5/5 UE strength. No further skilled OT services needed at acute level or on DC. OT to sign off.     Follow Up Recommendations  No OT follow up    Equipment Recommendations  None recommended by OT    Recommendations for Other Services       Precautions / Restrictions Precautions Precautions: Fall Restrictions Weight Bearing Restrictions: No      Mobility Bed Mobility Overal bed mobility: Independent                  Transfers Overall transfer level: Needs assistance Equipment used: None Transfers: Sit to/from Stand Sit to Stand: Supervision         General transfer comment: for safety though no LOB or assist needed    Balance Overall balance assessment: No apparent balance deficits (not formally assessed)                                         ADL either performed or assessed with clinical judgement   ADL Overall ADL's : Needs assistance/impaired                     Lower Body Dressing: Independent;Sit to/from stand Lower Body Dressing Details (indicate cue type and reason): to don socks Toilet Transfer: Supervision/safety;Ambulation;Regular Teacher, adult education Details (indicate cue type and reason): for safety Toileting- Clothing Manipulation and Hygiene:  Supervision/safety Toileting - Clothing Manipulation Details (indicate cue type and reason): distant supervision while pt in bathroom to ensure safety, no assist needed     Functional mobility during ADLs: Supervision/safety General ADL Comments: No physical assist needed for ADLs, close to baseline     Vision Baseline Vision/History: No visual deficits Patient Visual Report: No change from baseline Vision Assessment?: No apparent visual deficits     Perception     Praxis      Pertinent Vitals/Pain Pain Assessment: No/denies pain     Hand Dominance Right   Extremity/Trunk Assessment Upper Extremity Assessment Upper Extremity Assessment: Overall WFL for tasks assessed   Lower Extremity Assessment Lower Extremity Assessment: Defer to PT evaluation   Cervical / Trunk Assessment Cervical / Trunk Assessment: Normal   Communication Communication Communication: No difficulties   Cognition Arousal/Alertness: Awake/alert Behavior During Therapy: WFL for tasks assessed/performed Overall Cognitive Status: Within Functional Limits for tasks assessed                                 General Comments: Hx of TBI, 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 Increased time to identify which hospital he was at, mildly impulsive but pleasant and joking throughout   General Comments       Exercises  Shoulder Instructions      Home Living Family/patient expects to be discharged to:: Private residence Living Arrangements: Non-relatives/Friends Available Help at Discharge: Friend(s);Available PRN/intermittently Type of Home: House Home Access: Stairs to enter [employer] of Steps: 4 Entrance Stairs-Rails: Right Home Layout: Two level;Able to live on main level with bedroom/bathroom     Bathroom Shower/Tub: Producer, television/film/video: Standard     Home Equipment: None          Prior Functioning/Environment Level of Independence: Independent         Comments: No use of AD for mobility, able to complete ADLs, IADLs Independently. Works in [employer], does not drive due to seizures        OT Problem List:        OT Treatment/Interventions:      OT Goals(Current goals can be found in the care plan section) Acute Rehab OT Goals Patient Stated Goal: be able to properly control seizures, go home soon OT Goal Formulation: All assessment and education complete, DC therapy  OT Frequency:     Barriers to D/C:            Co-evaluation PT/OT/SLP Co-Evaluation/Treatment: Yes Reason for Co-Treatment: For patient/therapist safety;To address functional/ADL transfers   OT goals addressed during session: ADL's and self-care      AM-PAC OT "6 Clicks" Daily Activity     Outcome Measure Help from another person eating meals?: None Help from another person taking care of personal grooming?: None Help from another person toileting, which includes using toliet, bedpan, or urinal?: A Little Help from another person bathing (including washing, rinsing, drying)?: None Help from another person to put on and taking off regular upper body clothing?: None Help from another person to put on and taking off regular lower body clothing?: None 6 Click Score: 23   End of Session Equipment Utilized During Treatment: Gait belt Nurse Communication: Mobility status  Activity Tolerance: Patient tolerated treatment well Patient left: in bed;with call bell/phone within reach  OT Visit Diagnosis: Other abnormalities of gait and mobility (R26.89)                Time: 6812-7517 OT Time Calculation (min): 25 min Charges:  OT General Charges $OT Visit: 1 Visit OT Evaluation $OT Eval Low Complexity: 1 Low  Bradd Canary, OTR/L Acute Rehab Services Office: [phone]   Lorre Munroe 04/11/2021, 12:24 PM

## 2021-04-11 NOTE — ED Notes (Signed)
Provider and charge notified/

## 2021-04-11 NOTE — ED Notes (Signed)
PT in room 

## 2021-04-11 NOTE — Progress Notes (Signed)
PROGRESS NOTE    Lucas Williams  NOI:[id number] DOB: [birth year] DOA: 04/10/2021 PCP: Patient, No Pcp Per (Inactive)    Chief Complaint  Patient presents with   Seizures   Shortness of Breath    Brief Narrative:  Patient 45 year old gentleman history of seizures on Keppra, alcohol abuse, chronic transaminitis presented to the ED with seizures and shortness of breath.  When EMS called patient noted to have a generalized tonic-clonic seizures with decreased breath sounds difficulty breathing.  Patient seen in the ED and noted to have a seizure on BiPAP vomited and apparently aspirated and BiPAP discontinued.  Patient noted initially to be postictal but then subsequently that improved and alert and following commands.   Assessment & Plan:   Principal Problem:   Acute respiratory failure with hypoxia (HCC) Active Problems:   Alcohol abuse   Seizures (HCC)   Aspiration into airway   Transaminitis   COPD with acute exacerbation (HCC)  #1 acute respiratory failure with hypoxia -Likely multifactorial secondary to acute COPD exacerbation and possible aspiration pneumonia. -Repeat chest x-ray done with very mild lateral right base atelectasis and/or early infiltrate. -Discontinue Rocephin and azithromycin and placed empirically on IV Zosyn. -Continue Dulera, steroid taper, Incruse. -Add PPI. -Follow.  2.  Probable aspiration pneumonia -Secondary to emesis after seizure. -Chest x-ray done concerning for very mild lateral right base atelectasis versus infiltrate. -DC IV Rocephin and azithromycin. -IV Zosyn. -Continue Dulera, Incruse. -Supportive care.  3.  Transaminitis/ + HCV antibody -Acute hepatitis panel with a reactive HCV antibody.  Check HCV RNA viral load. -Outpatient follow-up with PCP.  4.  Seizure disorder -Status post Keppra load and Ativan. -CT head negative for any acute abnormalities. -Patient states has been compliant with his seizure medications. -Patient  denies daily alcohol consumption. -Neurology consulted recommending increasing Keppra to 750 mg twice daily and outpatient follow-up with primary neurologist. -Per neurology no indication for EEG at this time.  5.  Alcohol abuse -Patient denies daily alcohol consumption and states drinks alcohol socially. -No signs of withdrawal noted. -Continue the Ativan withdrawal protocol. -Supportive care.   DVT prophylaxis: Lovenox Code Status: Full Family Communication: Updated patient.  No family at bedside. Disposition:   Status is: Inpatient  Remains inpatient appropriate because:Inpatient level of care appropriate due to severity of illness  Dispo: The patient is from: Home              Anticipated d/c is to: Home              Patient currently is not medically stable to d/c.   Difficult to place patient No       Consultants:  Neurology: Dr. Selina Cooley  Procedures:  CT head 04/11/2021 Chest x-ray 04/10/2021   Antimicrobials:  IV Rocephin x 1 dose IV azithromycin x 1 dose IV Zosyn 04/11/2021>>>>   Subjective: Patient sitting up on gurney alert.  No further seizure episodes.  Denies any significant shortness of breath or chest pain.  Overall feeling better.  States he is thirsty asking for food.  Objective: Vitals:   04/11/21 0315 04/11/21 0358 04/11/21 0415 04/11/21 0740  BP: (!) 154/93 (!) 154/93 (!) 153/88 (!) 162/104  Pulse: 84 83 84 79  Resp: (!) 32  (!) 25 (!) 29  Temp:      TempSrc:      SpO2: 100%  93% 95%   No intake or output data in the 24 hours ending 04/11/21 0904 There were no vitals filed for this visit.  Examination:  General exam: Appears calm and comfortable  Respiratory system: Some coarse breath sounds in the bases otherwise clear.  No wheezing.  No crackles.  Fair air movement.  Speaking in full sentences  Cardiovascular system: Regular rate rhythm no murmurs rubs or gallops.  No JVD.  No lower extremity edema.   Gastrointestinal system: Abdomen is  nondistended, soft and nontender. No organomegaly or masses felt. Normal bowel sounds heard. Central nervous system: Alert and oriented. No focal neurological deficits. Extremities: Symmetric 5 x 5 power. Skin: No rashes, lesions or ulcers Psychiatry: Judgement and insight appear normal. Mood & affect appropriate.     Data Reviewed: I have personally reviewed following labs and imaging studies  CBC: Recent Labs  Lab 04/10/21 2050 04/11/21 0140  WBC 18.6* 19.1*  NEUTROABS 13.6*  --   HGB 15.2 15.2  HCT 47.6 43.9  MCV 99.8 92.8  PLT 227 209    Basic Metabolic Panel: Recent Labs  Lab 04/10/21 2041 04/10/21 2050 04/10/21 2330 04/11/21 0140 04/11/21 0144  NA  --  145  --  137  --   K  --  3.5  --  4.0  --   CL  --  111  --  107  --   CO2  --  16*  --  23  --   GLUCOSE  --  128*  --  132*  --   BUN  --  16  --  15  --   CREATININE  --  1.07  --  0.93  --   CALCIUM  --  8.7*  --  8.2*  --   MG 2.5*  --   --   --  2.2  PHOS  --   --  1.3*  --   --     GFR: CrCl cannot be calculated (Unknown ideal weight.).  Liver Function Tests: Recent Labs  Lab 04/10/21 2050 04/11/21 0140  AST 113* 95*  ALT 156* 144*  ALKPHOS 88 72  BILITOT 0.6 0.9  PROT 7.8 7.4  ALBUMIN 3.9 3.7    CBG: Recent Labs  Lab 04/10/21 2044  GLUCAP 122*     Recent Results (from the past 240 hour(s))  Resp Panel by RT-PCR (Flu A&B, Covid) Nasopharyngeal Swab     Status: None   Collection Time: 04/10/21  7:51 PM   Specimen: Nasopharyngeal Swab; Nasopharyngeal(NP) swabs in vial transport medium  Result Value Ref Range Status   SARS Coronavirus 2 by RT PCR NEGATIVE NEGATIVE Final    Comment: (NOTE) SARS-CoV-2 target nucleic acids are NOT DETECTED.  The SARS-CoV-2 RNA is generally detectable in upper respiratory specimens during the acute phase of infection. The lowest concentration of SARS-CoV-2 viral copies this assay can detect is 138 copies/mL. A negative result does not preclude  SARS-Cov-2 infection and should not be used as the sole basis for treatment or other patient management decisions. A negative result may occur with  improper specimen collection/handling, submission of specimen other than nasopharyngeal swab, presence of viral mutation(s) within the areas targeted by this assay, and inadequate number of viral copies(<138 copies/mL). A negative result must be combined with clinical observations, patient history, and epidemiological information. The expected result is Negative.  Fact Sheet for Patients:  BloggerCourse.comhttps://www.fda.gov/media/152166/download  Fact Sheet for Healthcare Providers:  SeriousBroker.ithttps://www.fda.gov/media/152162/download  This test is no t yet approved or cleared by the [place] FDA and  has been authorized for detection and/or diagnosis of SARS-CoV-2 by FDA under an Emergency  Use Authorization (EUA). This EUA will remain  in effect (meaning this test can be used) for the duration of the COVID-19 declaration under Section 564(b)(1) of the Act, 21 U.S.C.section 360bbb-3(b)(1), unless the authorization is terminated  or revoked sooner.       Influenza A by PCR NEGATIVE NEGATIVE Final   Influenza B by PCR NEGATIVE NEGATIVE Final    Comment: (NOTE) The Xpert Xpress SARS-CoV-2/FLU/RSV plus assay is intended as an aid in the diagnosis of influenza from Nasopharyngeal swab specimens and should not be used as a sole basis for treatment. Nasal washings and aspirates are unacceptable for Xpert Xpress SARS-CoV-2/FLU/RSV testing.  Fact Sheet for Patients: BloggerCourse.com  Fact Sheet for Healthcare Providers: SeriousBroker.it  This test is not yet approved or cleared by the [place] FDA and has been authorized for detection and/or diagnosis of SARS-CoV-2 by FDA under an Emergency Use Authorization (EUA). This EUA will remain in effect (meaning this test can be used) for the duration of  the COVID-19 declaration under Section 564(b)(1) of the Act, 21 U.S.C. section 360bbb-3(b)(1), unless the authorization is terminated or revoked.  Performed at [facility], [address] [place], [place] [address]          Radiology Studies: [facility] Chest Port 1 View  Result Date: 04/10/2021 CLINICAL DATA:  Shortness of breath. EXAM: PORTABLE CHEST 1 VIEW COMPARISON:  Chest x-ray 10/13/2020. FINDINGS: Slightly more prominent cardiac silhouette likely due to positioning/technique. The heart size and mediastinal contours are unchanged. No focal consolidation. No pulmonary edema. No pleural effusion. No pneumothorax. No acute osseous abnormality. IMPRESSION: No active disease. Electronically Signed   By: Tish Frederickson M.D.   On: 04/10/2021 20:20   DG Chest Port 1V same Day  Result Date: 04/11/2021 CLINICAL DATA:  Vomiting with possible aspiration since the prior study. EXAM: PORTABLE CHEST 1 VIEW COMPARISON:  April 10, 2021 (7:33 p.m.) FINDINGS: Very mild atelectasis and/or infiltrate is seen along the lateral aspect of the right lung base. This represents a new finding when compared to the prior study. There is no evidence of a pleural effusion or pneumothorax. The heart size and mediastinal contours are within normal limits. The visualized skeletal structures are unremarkable. IMPRESSION: Very mild lateral right basilar atelectasis and/or early infiltrate. Electronically Signed   By: Aram Candela M.D.   On: 04/11/2021 00:06        Scheduled Meds:  enoxaparin (LOVENOX) injection  40 mg Subcutaneous Q24H   folic acid  1 mg Oral Daily   LORazepam  0-4 mg Intravenous Q4H   Followed by   Melene Muller ON 04/13/2021] LORazepam  0-4 mg Intravenous Q8H   mometasone-formoterol  2 puff Inhalation BID   multivitamin with minerals  1 tablet Oral Daily   predniSONE  50 mg Oral Q breakfast   thiamine  100 mg Oral Daily   Or   thiamine  100 mg Intravenous Daily   umeclidinium  bromide  1 puff Inhalation Daily   Continuous Infusions:  albuterol 10 mg/hr (04/10/21 2002)   azithromycin     cefTRIAXone (ROCEPHIN)  IV 2 g (04/11/21 0138)   levETIRAcetam     potassium PHOSPHATE IVPB (in mmol)       LOS: 1 day    Time spent: 40 minutes  No charge.    Ramiro Harvest, MD [facility]   To contact the attending provider between 7A-7P or the covering provider during after hours 7P-7A, please log into the web site [email or web address]  and access using universal [place] password for that web site. If you do not have the password, please call the hospital operator.  04/11/2021, 9:04 AM

## 2021-04-11 NOTE — ED Notes (Signed)
Labs drawn before keppra infusion started

## 2021-04-11 NOTE — ED Notes (Signed)
Iv in trash can.

## 2021-04-11 NOTE — Evaluation (Signed)
Physical Therapy Evaluation Patient Details Name: Lucas Williams MRN: [id number] DOB: 07/19/1976 Today's Date: 04/11/2021   History of Present Illness  Lucas Williams is a 45 y.o. male who presents to ED due to SOB and seizure. Another seizure occurred in ED while pt on BiPAP. PMH: seizures, EtOH abuse, chronic LFT elevations.  Clinical Impression  Pt admitted as above and currently demonstrating IND in bed mobility, transfers and gait including stepping back, stepping side-ways, and stork standing with min difficulty.  Pt feels he is at or near base-line level for mobility at this time and with no further PT needs identified.  PT service will sign off.    Follow Up Recommendations No PT follow up    Equipment Recommendations  None recommended by PT    Recommendations for Other Services       Precautions / Restrictions Precautions Precautions: Fall Restrictions Weight Bearing Restrictions: No      Mobility  Bed Mobility Overal bed mobility: Independent                  Transfers Overall transfer level: Needs assistance Equipment used: None Transfers: Sit to/from Stand Sit to Stand: Supervision         General transfer comment: for safety though no LOB or assist needed  Ambulation/Gait Ambulation/Gait assistance: Min guard;Supervision;Independent Gait Distance (Feet): 500 Feet Assistive device: None Gait Pattern/deviations: Step-through pattern;Wide base of support Gait velocity: mod pace   General Gait Details: Mild initial instability with stiff-legged gair and widened BOS but progressed to IND ambulation.  Pt states this is his norm  Information systems manager [employer] (Stroke Patients Only)       Balance Overall balance assessment: Mild deficits observed, not formally tested                                           Pertinent Vitals/Pain Pain Assessment: No/denies pain    Home Living  Family/patient expects to be discharged to:: Private residence Living Arrangements: Non-relatives/Friends Available Help at Discharge: Friend(s);Available PRN/intermittently Type of Home: House Home Access: Stairs to enter Entrance Stairs-Rails: Right Entrance Stairs-Number of Steps: 4 Home Layout: Two level;Able to live on main level with bedroom/bathroom Home Equipment: None      Prior Function Level of Independence: Independent         Comments: No use of AD for mobility, able to complete ADLs, IADLs Independently. Works in [employer], does not drive due to seizures     Hand Dominance   Dominant Hand: Right    Extremity/Trunk Assessment   Upper Extremity Assessment Upper Extremity Assessment: Overall WFL for tasks assessed    Lower Extremity Assessment Lower Extremity Assessment: Overall WFL for tasks assessed    Cervical / Trunk Assessment Cervical / Trunk Assessment: Normal  Communication   Communication: No difficulties  Cognition Arousal/Alertness: Awake/alert Behavior During Therapy: WFL for tasks assessed/performed Overall Cognitive Status: Within Functional Limits for tasks assessed                                 General Comments: Hx of TBI, 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 Increased time to identify which hospital he was at, mildly impulsive but pleasant and joking throughout      General Comments  Exercises     Assessment/Plan    PT Assessment Patient needs continued PT services  PT Problem List         PT Treatment Interventions Gait training;Functional mobility training    PT Goals (Current goals can be found in the Care Plan section)  Acute Rehab PT Goals Patient Stated Goal: be able to properly control seizures, go home soon PT Goal Formulation: All assessment and education complete, DC therapy    Frequency Min 1X/week   Barriers to discharge        Co-evaluation PT/OT/SLP Co-Evaluation/Treatment: Yes Reason for  Co-Treatment: For patient/therapist safety PT goals addressed during session: Mobility/safety with mobility OT goals addressed during session: ADL's and self-care       AM-PAC PT "6 Clicks" Mobility  Outcome Measure Help needed turning from your back to your side while in a flat bed without using bedrails?: None Help needed moving from lying on your back to sitting on the side of a flat bed without using bedrails?: None Help needed moving to and from a bed to a chair (including a wheelchair)?: None Help needed standing up from a chair using your arms (e.g., wheelchair or bedside chair)?: None Help needed to walk in hospital room?: None Help needed climbing 3-5 steps with a railing? : None 6 Click Score: 24    End of Session Equipment Utilized During Treatment: Gait belt Activity Tolerance: Patient tolerated treatment well Patient left: in bed;with call bell/phone within reach Nurse Communication: Mobility status PT Visit Diagnosis: Difficulty in walking, not elsewhere classified (R26.2)    Time: 1135-1200 PT Time Calculation (min) (ACUTE ONLY): 25 min   Charges:   PT Evaluation $PT Eval Low Complexity: 1 Low          Mauro Kaufmann PT Acute Rehabilitation Services Pager [phone] Office [phone]   Neytiri Asche 04/11/2021, 1:59 PM

## 2021-04-11 NOTE — Discharge Summary (Signed)
Physician Discharge Summary  Lucas Williams KZS:[id number] DOB: 05-14-1976 DOA: 04/10/2021  PCP: Patient, No Pcp Per (Inactive)  Admit date: 04/10/2021 Discharge date: 04/11/2021  Time spent: 15 minutes  Patient left AMA  Recommendations for Outpatient Follow-up:  Patient left AMA, hopefully patient follows up with his primary neurologist and PCP.   Discharge Diagnoses:  Principal Problem:   Acute respiratory failure with hypoxia (HCC) Active Problems:   Alcohol abuse   Seizure (HCC)   Aspiration into airway   Transaminitis   COPD with acute exacerbation [facility])   Discharge Condition: Left AMA  Diet recommendation: Left AMA  There were no vitals filed for this visit.  History of present illness:  HPI per Dr. Fortino Sic Ou is a 45 y.o. male with medical history significant of Prior seizures on keppra, ongoing EtOH abuse, chronic LFT elevations.   Pt presents to the ED with c/o seizure, SOB.   EMS called out for generalized tonic clonic seizure.  Pt with decreased BS, difficulty breathing.  Symptoms onset this afternoon, persistent, severe, nothing makes better or worse.   Does smoke.   Says compliant with seizure meds.   No EtOH today.   No fevers, chills.     ED Course: In ED, pt put on bipap.  Had seizure on bipap, vomited and aspirated apparently.  BIPAP DCd.   Pt initially post ictal, though has woken up at this time.   Wheezing and SOB improved post neb treatments and steroids.   Still very tachypnic though.   WBC 18k   Initial CXR clear.   Repeat CXR (after aspiration) is pending.   Hospital Course:   Patient left AMA For hospital course please see progress note done 04/11/2021.   Procedures: CT head 04/11/2021 Chest x-ray 04/10/2021    Consultations: Neurology: Dr. Selina Cooley 04/11/2021  Discharge Exam: Vitals:   04/11/21 1727 04/11/21 1830  BP: 117/73 125/78  Pulse: 72 62  Resp: 20   Temp: 98.6 F (37 C)   SpO2: 96% 98%     General: Left AMA Cardiovascular: Left AMA Respiratory: Left AMA  Discharge Instructions  Patient left AMA   No Known Allergies    The results of significant diagnostics from this hospitalization (including imaging, microbiology, ancillary and laboratory) are listed below for reference.    Significant Diagnostic Studies: CT HEAD WO CONTRAST  Result Date: 04/11/2021 CLINICAL DATA:  Seizure. EXAM: CT HEAD WITHOUT CONTRAST TECHNIQUE: Contiguous axial images were obtained from the base of the skull through the vertex without intravenous contrast. COMPARISON:  10/28/2019 FINDINGS: Brain: Stable encephalomalacia again identified in the right cerebral hemisphere with stable encephalomalacia inferior left frontal lobe. There is no evidence for acute hemorrhage, hydrocephalus, mass lesion, or abnormal extra-axial fluid collection. There is no evidence for acute hemorrhage, hydrocephalus, mass lesion, or abnormal extra-axial fluid collection. No definite CT evidence for acute infarction. Vascular: No hyperdense vessel or unexpected calcification. Skull: No evidence for fracture. No worrisome lytic or sclerotic lesion. Sinuses/Orbits: The visualized paranasal sinuses and mastoid air cells are clear. Visualized portions of the globes and intraorbital fat are unremarkable. Other: None. IMPRESSION: 1. No acute intracranial abnormality. 2. 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 Electronically Signed   By: Kennith Center M.D.   On: 04/11/2021 10:03   DG Chest Port 1 View  Result Date: 04/10/2021 CLINICAL DATA:  Shortness of breath. EXAM: PORTABLE CHEST 1 VIEW COMPARISON:  Chest x-ray 10/13/2020. FINDINGS: Slightly more prominent cardiac silhouette likely  due to positioning/technique. The heart size and mediastinal contours are unchanged. No focal consolidation. No pulmonary edema. No pleural effusion. No pneumothorax. No acute osseous abnormality.  IMPRESSION: No active disease. Electronically Signed   By: Tish Frederickson M.D.   On: 04/10/2021 20:20   DG Chest Port 1V same Day  Result Date: 04/11/2021 CLINICAL DATA:  Vomiting with possible aspiration since the prior study. EXAM: PORTABLE CHEST 1 VIEW COMPARISON:  April 10, 2021 (7:33 p.m.) FINDINGS: Very mild atelectasis and/or infiltrate is seen along the lateral aspect of the right lung base. This represents a new finding when compared to the prior study. There is no evidence of a pleural effusion or pneumothorax. The heart size and mediastinal contours are within normal limits. The visualized skeletal structures are unremarkable. IMPRESSION: Very mild lateral right basilar atelectasis and/or early infiltrate. Electronically Signed   By: Aram Candela M.D.   On: 04/11/2021 00:06    Microbiology: Recent Results (from the past 240 hour(s))  Resp Panel by RT-PCR (Flu A&B, Covid) Nasopharyngeal Swab     Status: None   Collection Time: 04/10/21  7:51 PM   Specimen: Nasopharyngeal Swab; Nasopharyngeal(NP) swabs in vial transport medium  Result Value Ref Range Status   SARS Coronavirus 2 by RT PCR NEGATIVE NEGATIVE Final    Comment: (NOTE) SARS-CoV-2 target nucleic acids are NOT DETECTED.  The SARS-CoV-2 RNA is generally detectable in upper respiratory specimens during the acute phase of infection. The lowest concentration of SARS-CoV-2 viral copies this assay can detect is 138 copies/mL. A negative result does not preclude SARS-Cov-2 infection and should not be used as the sole basis for treatment or other patient management decisions. A negative result may occur with  improper specimen collection/handling, submission of specimen other than nasopharyngeal swab, presence of viral mutation(s) within the areas targeted by this assay, and inadequate number of viral copies(<138 copies/mL). A negative result must be combined with clinical observations, patient history, and  epidemiological information. The expected result is Negative.  Fact Sheet for Patients:  BloggerCourse.com  Fact Sheet for Healthcare Providers:  SeriousBroker.it  This test is no t yet approved or cleared by the [place] FDA and  has been authorized for detection and/or diagnosis of SARS-CoV-2 by FDA under an Emergency Use Authorization (EUA). This EUA will remain  in effect (meaning this test can be used) for the duration of the COVID-19 declaration under Section 564(b)(1) of the Act, 21 U.S.C.section 360bbb-3(b)(1), unless the authorization is terminated  or revoked sooner.       Influenza A by PCR NEGATIVE NEGATIVE Final   Influenza B by PCR NEGATIVE NEGATIVE Final    Comment: (NOTE) The Xpert Xpress SARS-CoV-2/FLU/RSV plus assay is intended as an aid in the diagnosis of influenza from Nasopharyngeal swab specimens and should not be used as a sole basis for treatment. Nasal washings and aspirates are unacceptable for Xpert Xpress SARS-CoV-2/FLU/RSV testing.  Fact Sheet for Patients: BloggerCourse.com  Fact Sheet for Healthcare Providers: SeriousBroker.it  This test is not yet approved or cleared by the [place] FDA and has been authorized for detection and/or diagnosis of SARS-CoV-2 by FDA under an Emergency Use Authorization (EUA). This EUA will remain in effect (meaning this test can be used) for the duration of the COVID-19 declaration under Section 564(b)(1) of the Act, 21 U.S.C. section 360bbb-3(b)(1), unless the authorization is terminated or revoked.  Performed at [facility], [address] [place], [place] [address]      Labs:  Basic Metabolic Panel: Recent Labs  Lab 04/10/21 2041 04/10/21 2050 04/10/21 2330 04/11/21 0140 04/11/21 0144  NA  --  145  --  137  --   K  --  3.5  --  4.0  --   CL  --  111  --  107  --   CO2   --  16*  --  23  --   GLUCOSE  --  128*  --  132*  --   BUN  --  16  --  15  --   CREATININE  --  1.07  --  0.93  --   CALCIUM  --  8.7*  --  8.2*  --   MG 2.5*  --   --   --  2.2  PHOS  --   --  1.3*  --   --    Liver Function Tests: Recent Labs  Lab 04/10/21 2050 04/11/21 0140  AST 113* 95*  ALT 156* 144*  ALKPHOS 88 72  BILITOT 0.6 0.9  PROT 7.8 7.4  ALBUMIN 3.9 3.7   No results for input(s): LIPASE, AMYLASE in the last 168 hours. No results for input(s): AMMONIA in the last 168 hours. CBC: Recent Labs  Lab 04/10/21 2050 04/11/21 0140  WBC 18.6* 19.1*  NEUTROABS 13.6*  --   HGB 15.2 15.2  HCT 47.6 43.9  MCV 99.8 92.8  PLT 227 209   Cardiac Enzymes: No results for input(s): CKTOTAL, CKMB, CKMBINDEX, TROPONINI in the last 168 hours. BNP: BNP (last 3 results) No results for input(s): BNP in the last 8760 hours.  ProBNP (last 3 results) No results for input(s): PROBNP in the last 8760 hours.  CBG: Recent Labs  Lab 04/10/21 2044  GLUCAP 122*       Signed:  Ramiro Harvest MD.  [facility] 04/11/2021, 7:11 PM

## 2021-04-11 NOTE — ED Notes (Signed)
Patient had a seizure that lasted about one minute. MD made aware and is at bedside. 1mg  of ativan ordered. Postictal patient became agitated and started fighting against staff. Patient pulled his IV out and was pulling off his oxygen mask. Soft restraints applied.

## 2021-04-11 NOTE — Consult Note (Addendum)
Neurology Attending Attestation   I examined the patient and discussed plan with Ms. Lucas Hummer NP. The note below reflects my findings and recommendations with the following updates:  CT head NAICP, stable encephalomalacia R hemisphere and inferior L frontal lobe (personal review) No indication for EEG, would not change mgmt Increase keppra to 750mg  bid F/u with established outpatient neurologist. No further inpatient neurologic workup indicated. Neurology will sign off, but please re-engage if additional neurologic concerns arise.   , MD [facility] [phone]   If 7pm- 7am, please page neurology on call as listed in AMION.     Neurology Consultation Reason for Consult: seizure activity Requesting Physician: [phone]  CC: seizure activity  History is obtained from:chart and patient  HPI: Lucas Williams Lucas Williams Williams is a 45 y.o. male with history of seizure activity (on keppra 500mg  bid), etoh abuse with transaminitis, cigarette abuse, who presented to ED complaining of seizure activity. Initial presentation included confusion and respiratory distress. 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 A few hours later they were called back as patient was having what is described as a generalized tonic clonic seizure. Additionally he had shortness of breath and chest pain.  He was brought to the ED for evaluation. On approach at the ED he was awake and alert without focal neurologic findings.    Around 8:50 last night patient reportedly had another seizure lasting approximately one minute. He was given 1mg  ativan, keppra 500mg . He became postictal and was combative with nursing staff.   At time of exam patient is alert, pleasant and cooperative.  He reports taking his keppra twice daily, last use yesterday morning.   Seizure history is unclear but appears to date back to  02/11/2017. Patient was seen in the ED for a TBI when he was found down by the police with brusing around eyes. 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    Neurosurgery was consulted but no surgery was performed. He was started on keppra for seizure precautions. Review of chart shows numerous hospitalizations for alcoholic intoxication, seizure activity  as well as noncompliance with medication d/t cost. He has been seen by [facility] and has been following  with the group, last visit was June 7th, 2022.     ROS: All other review of systems was negative except as noted in the HPI.      Past Medical History:  Diagnosis Date   Laceration of hand, left    Seizures (HCC)       Family History  Problem Relation Age of Onset   Seizures Neg Hx       Social History:  reports that he has been smoking. He has been smoking an average of .35 packs per day. He has never used smokeless tobacco. He reports current alcohol use of about 6.0 standard drinks of alcohol per week. He reports that he does not use drugs.    Exam: Current vital signs: BP (!) 162/104 (BP Location: Left Arm)   Pulse 79   Temp 98.1 F (36.7 C) (Oral)   Resp (!) 29   SpO2 95%  Vital signs in last 24 hours: Temp:  [96.8 F (36 C)-98.1 F (36.7 C)] 98.1 F (36.7 C) (07/23 0051) Pulse Rate:  [79-107] 79 (07/23 0740) Resp:  [16-38] 29 (07/23 0740) BP: (153-180)/(88-116) 162/104 (07/23 0740) SpO2:  [93 %-100 %] 95 % (07/23 0740) FiO2 (%):  [70 %]  70 % (07/22 2012)   Physical Exam  Constitutional: Appears well-developed and well-nourished.  Psych: Affect appropriate to situation,   Eyes: No scleral injection HENT: No oropharyngeal obstruction.  MSK: no joint deformities.  Cardiovascular: Normal rate and regular rhythm.  Respiratory: Effort normal, non-labored breathing GI: Soft.  No distension. There is no tenderness.  Skin: Warm dry and intact visible skin  Neuro: Mental Status: Patient is awake, alert, oriented to  person, place,  year, and situation.  Patient is able to give a clear and coherent history.  No signs of aphasia or neglect  Cranial Nerves: II: Visual Fields are full. Pupils are equal, round, and reactive to light.    III,IV, VI: EOMI without ptosis or diploplia.  V: Facial sensation is symmetric to temperature VII: Facial movement is symmetric.  VIII: hearing is intact to voice X: Uvula elevates symmetrically XI: Shoulder shrug is symmetric. XII: tongue is midline without atrophy or fasciculations.  Motor: Tone is normal. Bulk is normal. 5/5 strength was present in all four extremities.   Sensory: Sensation is symmetric to light touch and temperature in the arms and legs.  Deep Tendon Reflexes: 2+ and symmetric in the biceps and patellae.   Plantars: Toes are downgoing bilaterally.   Cerebellar: FNF and HKS are intact bilaterally     I have reviewed labs in epic and the results pertinent to this consultation are:  Results for Lucas Williams, Lucas Williams Williams (MRN [id number]) as of 04/11/2021 09:43  Ref. Range 04/10/2021 20:40  Alcohol, Ethyl (B) Latest Ref Range: <10 mg/dL <62     Impression:   45 year old white male with history of etoh use and seizure disorder on keppra with reported compliance presented with seizure activity while working outside on a grill yesterday. Pt developed additional seizure last night. On CIWA protocol.  Recommendations: - admit to hospitalist service for close neurologic monitoring.  -CT head without contrast to evaluate for cranial abnormalities. -Seizure precautions.  -Continue with CIWA protocol.  -continue with Keppra 500mg  bid. Will consider adjusting dose for breakthrough activity. Seizure may be related to alcohol use, as it lowers seizure threshold. Pt does report compliance with medication as prescribed.  -Keppra level.  - we will continue to follow.        Critical care time was exclusive of separately billable procedures and treating other  patients.   Critical care was necessary to treat or prevent imminent or life-threatening deterioration.   Critical care was time spent personally by me on the following activities: development of treatment plan with patient and/or surrogate as well as nursing, discussions with consultants/primary team, evaluation of patient's response to treatment, examination of patient, obtaining history from patient or surrogate, ordering and performing treatments and interventions, ordering and review of laboratory studies, ordering and review of radiographic studies, and re-evaluation of patient's condition as needed, as documented above.

## 2021-04-11 NOTE — H&P (Signed)
History and Physical    Lucas Williams POE:[id number] DOB: 1976/07/24 DOA: 04/10/2021  PCP: Patient, No Pcp Per (Inactive)  Patient coming from: Home  I have personally briefly reviewed patient's old medical records in [facility]  Chief Complaint: Seizure  HPI: Lucas Williams is a 45 y.o. male with medical history significant of Prior seizures on keppra, ongoing EtOH abuse, chronic LFT elevations.  Pt presents to the ED with c/o seizure, SOB.  EMS called out for generalized tonic clonic seizure.  Pt with decreased BS, difficulty breathing.  Symptoms onset this afternoon, persistent, severe, nothing makes better or worse.  Does smoke.  Says compliant with seizure meds.  No EtOH today.  No fevers, chills.   ED Course: In ED, pt put on bipap.  Had seizure on bipap, vomited and aspirated apparently.  BIPAP DCd.  Pt initially post ictal, though has woken up at this time.  Wheezing and SOB improved post neb treatments and steroids.  Still very tachypnic though.  WBC 18k  Initial CXR clear.  Repeat CXR (after aspiration) is pending.   Review of Systems: As per HPI, otherwise all review of systems negative.  Past Medical History:  Diagnosis Date   Laceration of hand, left    Seizures (HCC)     Past Surgical History:  Procedure Laterality Date   FINGER SURGERY     r/t staph infection I&D, hand lac cutting trees   HERNIA REPAIR     LT ihr   NERVE REPAIR Left 12/11/2012   Procedure: Left Hand Exploration Wound with Nerve Repair;  Surgeon: Tami Ribas, MD;  Location: [facility];  Service: Orthopedics;  Laterality: Left;     reports that he has been smoking. He has been smoking an average of .35 packs per day. He has never used smokeless tobacco. He reports current alcohol use of about 6.0 standard drinks of alcohol per week. He reports that he does not use drugs.  No Known Allergies  Family History  Problem Relation Age of Onset    Seizures Neg Hx      Prior to Admission medications   Medication Sig Start Date End Date Taking? Authorizing Provider  ibuprofen (ADVIL) 600 MG tablet Take 1 tablet (600 mg total) by mouth every 6 (six) hours as needed. 05/10/20   Elpidio Anis, PA-C  levETIRAcetam (KEPPRA) 500 MG tablet Take 1 tablet (500 mg total) by mouth 2 (two) times daily. 02/24/21   Lomax, Amy, NP  Multiple Vitamin (MULTIVITAMIN ADULT) TABS Take 1 tablet by mouth daily.    [provider]  Omega-3 Fatty Acids (FISH OIL) 1200 MG CAPS Take 1,000 mg by mouth daily.    [provider]    Physical Exam: Vitals:   04/10/21 2110 04/10/21 2143 04/10/21 2220 04/10/21 2253  BP: (!) 168/113 (!) 180/109 (!) 170/98 (!) 170/93  Pulse: (!) 107 (!) 101 100 95  Resp: (!) 35 (!) 38 (!) 38 (!) 36  Temp:      TempSrc:      SpO2: 100% 100% 95% 93%    Constitutional: Alert, no distress, tachypnic, but satting 100% on 3L [place]. Eyes: PERRL, lids and conjunctivae normal ENMT: Tongue lacs Neck: normal, supple, no masses, no thyromegaly Respiratory: Tachypnic, decreased air movement bilaterally with wheezes Cardiovascular: Regular rate and rhythm, no murmurs / rubs / gallops. No extremity edema. 2+ pedal pulses. No carotid bruits.  Abdomen: no tenderness, no masses palpated. No hepatosplenomegaly. Bowel sounds positive.  Musculoskeletal: no  clubbing / cyanosis. No joint deformity upper and lower extremities. Good ROM, no contractures. Normal muscle tone.  Skin: no rashes, lesions, ulcers. No induration Neurologic: CN 2-12 grossly intact. Sensation intact, DTR normal. Strength 5/5 in all 4.  Psychiatric: Normal judgment and insight. Alert and oriented x 3. Normal mood.    Labs on Admission: I have personally reviewed following labs and imaging studies  CBC: Recent Labs  Lab 04/10/21 2050  WBC 18.6*  NEUTROABS 13.6*  HGB 15.2  HCT 47.6  MCV 99.8  PLT 227   Basic Metabolic Panel: Recent Labs  Lab  04/10/21 2041 04/10/21 2050  NA  --  145  K  --  3.5  CL  --  111  CO2  --  16*  GLUCOSE  --  128*  BUN  --  16  CREATININE  --  1.07  CALCIUM  --  8.7*  MG 2.5*  --    GFR: CrCl cannot be calculated (Unknown ideal weight.). Liver Function Tests: Recent Labs  Lab 04/10/21 2050  AST 113*  ALT 156*  ALKPHOS 88  BILITOT 0.6  PROT 7.8  ALBUMIN 3.9   No results for input(s): LIPASE, AMYLASE in the last 168 hours. No results for input(s): AMMONIA in the last 168 hours. Coagulation Profile: No results for input(s): INR, PROTIME in the last 168 hours. Cardiac Enzymes: No results for input(s): CKTOTAL, CKMB, CKMBINDEX, TROPONINI in the last 168 hours. BNP (last 3 results) No results for input(s): PROBNP in the last 8760 hours. HbA1C: No results for input(s): HGBA1C in the last 72 hours. CBG: Recent Labs  Lab 04/10/21 2044  GLUCAP 122*   Lipid Profile: No results for input(s): CHOL, HDL, LDLCALC, TRIG, CHOLHDL, LDLDIRECT in the last 72 hours. Thyroid Function Tests: No results for input(s): TSH, T4TOTAL, FREET4, T3FREE, THYROIDAB in the last 72 hours. Anemia Panel: No results for input(s): VITAMINB12, FOLATE, FERRITIN, TIBC, IRON, RETICCTPCT in the last 72 hours. Urine analysis:    Component Value Date/Time   COLORURINE YELLOW 08/16/2020 1632   APPEARANCEUR HAZY (A) 08/16/2020 1632   LABSPEC 1.025 08/16/2020 1632   PHURINE 5.0 08/16/2020 1632   GLUCOSEU NEGATIVE 08/16/2020 1632   HGBUR NEGATIVE 08/16/2020 1632   BILIRUBINUR NEGATIVE 08/16/2020 1632   KETONESUR 5 (A) 08/16/2020 1632   PROTEINUR NEGATIVE 08/16/2020 1632   NITRITE NEGATIVE 08/16/2020 1632   LEUKOCYTESUR NEGATIVE 08/16/2020 1632    Radiological Exams on Admission: DG Chest Port 1 View  Result Date: 04/10/2021 CLINICAL DATA:  Shortness of breath. EXAM: PORTABLE CHEST 1 VIEW COMPARISON:  Chest x-ray 10/13/2020. FINDINGS: Slightly more prominent cardiac silhouette likely due to positioning/technique.  The heart size and mediastinal contours are unchanged. No focal consolidation. No pulmonary edema. No pleural effusion. No pneumothorax. No acute osseous abnormality. IMPRESSION: No active disease. Electronically Signed   By: Tish Frederickson M.D.   On: 04/10/2021 20:20    EKG: Independently reviewed.  Assessment/Plan Principal Problem:   Acute respiratory failure with hypoxia (HCC) Active Problems:   Alcohol abuse   Seizures (HCC)   Aspiration into airway   Transaminitis   COPD with acute exacerbation (HCC)    Acute resp failure with hypoxia - Apparent COPD exacerbation Wheezing improved after neb treatments, solumedrol Cont pulse ox Repeat CXR pending COPD pathway PRN SABA Scheduled LABA/LAMA and inh steroid Prednisone Tele monitor Rocephin Add azithromycin if repeat CXR confirms aspiration Seizures - S/p Keppra load + ativan Will resume home dose of Keppra 500mg , but make  this IV for the moment Concern that seizure thresh hold may be lowered due to EtOH withdrawal in this pt (see below). Seizure precautions EtOH abuse - No obvious withdrawal symptoms at the moment post ativan + keppra But concerned that seizure may have been due to withdrawal Putting pt on CIWA With scheduled IV ativan Transaminitis - Seems chronic and baseline EtOH related? Getting hepatitis pnl Repeat CMP in AM  DVT prophylaxis: Lovenox Code Status: Full Family Communication: No family in room Disposition Plan: Home after breathing improved Consults called: None Admission status: Admit to inpatient  Severity of Illness: The appropriate patient status for this patient is INPATIENT. Inpatient status is judged to be reasonable and necessary in order to provide the required intensity of service to ensure the patient's safety. The patient's presenting symptoms, physical exam findings, and initial radiographic and laboratory data in the context of their chronic comorbidities is felt to place them at  high risk for further clinical deterioration. Furthermore, it is not anticipated that the patient will be medically stable for discharge from the hospital within 2 midnights of admission. The following factors support the patient status of inpatient.   Patient has acute respiratory failure with hypoxia due to having a new oxygen requirement.  That is the patient has a PaO2 < 60 (pulse Ox < 90%) on room air.   * I certify that at the point of admission it is my clinical judgment that the patient will require inpatient hospital care spanning beyond 2 midnights from the point of admission due to high intensity of service, high risk for further deterioration and high frequency of surveillance required.*   Lucas Williams M. DO [facility]  How to contact the [facility] Attending or Consulting provider 7A - 7P or covering provider during after hours 7P -7A, for this patient?  Check the care team in [facility] and look for a) attending/consulting [facility] provider listed and b) the [facility] team listed Log into [email or web address]  Amion Physician Scheduling and messaging for groups and whole hospitals  On call and physician scheduling software for group practices, residents, hospitalists and other medical providers for call, clinic, rotation and shift schedules. On[employer] is a hospital-wide system for scheduling doctors and paging doctors on call. EasyPlot is for scientific plotting and data analysis.  [email or web address]  and use [place]'s universal password to access. If you do not have the password, please contact the hospital operator.  Locate the [facility] provider you are looking for under [facility] and page to a number that you can be directly reached. If you still have difficulty reaching the provider, please page the [facility] (Director on Call) for the Hospitalists listed on amion for assistance.  04/11/2021, 12:01 AM

## 2021-04-11 NOTE — ED Notes (Signed)
Pt  Just up and pulled his IV out and left without a word.

## 2021-04-12 DIAGNOSIS — J441 Chronic obstructive pulmonary disease with (acute) exacerbation: Secondary | ICD-10-CM | POA: Insufficient documentation

## 2021-04-12 DIAGNOSIS — J189 Pneumonia, unspecified organism: Secondary | ICD-10-CM | POA: Insufficient documentation

## 2021-04-14 LAB — HEPATITIS C GENOTYPE

## 2021-04-14 LAB — HCV RNA QUANT RFLX ULTRA OR GENOTYP
HCV RNA Qnt(log copy/mL): 6.656 {Log_IU}/mL
HepC Qn: 4530000 [IU]/mL

## 2021-06-03 NOTE — Progress Notes (Deleted)
No chief complaint on file.   HISTORY OF PRESENT ILLNESS: 06/03/21 ALL:  Lucas Williams returns for follow up for seizures. He reportedly continues levetiracetam 500mg  BID. He was seen in local ER 04/11/2021 for seizure activity and chest pain with shob. He required BiPAP therapy and continuous oxygen after tonic clonic seizure at his place of employment and second seizure in the ER. He was admitted for monitoring and seizure management but left AMA the next day.   02/24/2021 ALL:  Lucas Williams is a 45 y.o. male here today for follow up for epilepsy. He was seen in consult with Dr 59 in 10/2019. He did not return for follow up. Call from his sister in 12/2019 stating that patient was kicked out of her home for binge drinking and erratic behaviors. Sister claimed be was non compliant with AED. Since, he reports that he has been doing well. Initially he states that he has not had a seizure in 2 years. Review of chart shows he was seen in the ER at local hospital for seizure activity 06/01/2020. He admits that he has not taken levetiracetam for several months. He came in to request an appt in May and refills were sent in. He states that he has taken levetiractam consistently, since.   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 He is unable to tell me the name of the factory. He reports that it is in [place]. He reports that he "forgot" to tell them he had seizures. About 2 months ago, he became dizzy and was pulled from the line by his boss. He states that he was sent to the factory nurse and sent home. He lost his job. 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 He is hoping to be considered for rehire at [employer]. He is requesting paperwork stating that he is able to work without restrictions. He reports that he does not drive. He states that he drinks on occasion. He reports having two beers last night.    HISTORY (copied from Dr [employer] previous note)  HPI:  Lucas Williams is  a 45 y.o. male here as requested by No ref. provider found for seizures.  Patient's been to the emergency room twice, reviewed records, the first time was May 30, 2019 was in the setting of dehydration and illness, the second time he went in November he had missed multiple doses and again he was prescribed and encouraged to take the Keppra, he has had imaging including CT of the head.. Today he is here alone.  He reports am PMHx TBI and alcohol use but now he only has 3 beers a week. He has head trauma 3-4 years ago but didn't star having seizures until 6 months ago. No inciting events. He work's at [employer], he is uninsured, he reports he had a seizure, complete loss of consciousness, when he woke up he was told by an EMT that he had a seizure. He was on the floor making noises, he does not know what happened he was told he moved and he was making a lot of noises, bit tongue very badly, brought him tot he ED 05/2019  it was in the setting of nausea, vomiting, diarrhea. He was given a prescription for Keppra but he did not take it, he reports he could not afford it it was several $100 at the pharmacy and he did not take it. In November had another seizure, went tot he ED,  he had missed a dose per ED report but patient states today he may not have even started taking it so unclear, similar loss of consciousness, generalized body shaking. He had a seizure last Sunday, has not been taking the Keppra beause of financial reasons, he has been only taking 250mg  a day. He is uninsured.  He denies any significant alcohol use.  He denies any inciting events to the seizures he does not know why this is happening, he denies any drug use, he did have head injury several years ago but unclear why he started having seizures 6 months ago, he does endorse tongue biting, the seizures last a few minutes and that he is postictal afterwards, no history of seizures in his past, no family history of seizures, no weakness, no focal  deficits.No other focal neurologic deficits, associated symptoms, inciting events or modifiable factors.   Reviewed notes, labs and imaging from outside physicians, which showed:   Personally reviewed CT of the head October 28, 2019, May 28, 2019, agree with the following   2. 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   08/05/2019 alcohol was elevated at 21 10/28/2019 CBC showed elevated white blood cells, BMP showed decreased chloride, decreased CO2, elevated glucose, increased anion gap May 28, 2019 rapid urine drug screen was negative May 28, 2019 lactic acid initially elevated 2.4 then normalized to 1.4 August 27, 2019 ethanol normal    REVIEW OF SYSTEMS: Out of a complete 14 system review of symptoms, the patient complains only of the following symptoms, seizures, and all other reviewed systems are negative.   ALLERGIES: No Known Allergies   HOME MEDICATIONS: Outpatient Medications Prior to Visit  Medication Sig Dispense Refill   ibuprofen (ADVIL) 600 MG tablet Take 1 tablet (600 mg total) by mouth every 6 (six) hours as needed. (Patient not taking: Reported on 04/11/2021) 30 tablet 0   levETIRAcetam (KEPPRA) 500 MG tablet Take 1 tablet (500 mg total) by mouth 2 (two) times daily. 60 tablet 2   Multiple Vitamin (MULTIVITAMIN ADULT) TABS Take 1 tablet by mouth daily.     No facility-administered medications prior to visit.     PAST MEDICAL HISTORY: Past Medical History:  Diagnosis Date   Laceration of hand, left    Seizures (HCC)      PAST SURGICAL HISTORY: Past Surgical History:  Procedure Laterality Date   FINGER SURGERY     r/t staph infection I&D, hand lac cutting trees   HERNIA REPAIR     LT ihr   NERVE REPAIR Left 12/11/2012   Procedure: Left Hand Exploration Wound with Nerve Repair;  Surgeon: 12/13/2012, MD;  Location: [facility];  Service: Orthopedics;   Laterality: Left;     FAMILY HISTORY: Family History  Problem Relation Age of Onset   Seizures Neg Hx      SOCIAL HISTORY: Social History   Socioeconomic History   Marital status: Single    Spouse name: Not on file   Number of children: 2   Years of education: Not on file   Highest education level: High school graduate  Occupational History   Not on file  Tobacco Use   Smoking status: Every Day    Packs/day: 0.35    Types: Cigarettes   Smokeless tobacco: Never   Tobacco comments:    trying to decrease  Vaping Use   Vaping Use: Former  Substance and Sexual Activity   Alcohol  use: Yes    Alcohol/week: 6.0 standard drinks    Types: 4 Cans of beer, 2 Shots of liquor per week    Comment: weekly; update 11/06/19 pt states he cut down on the amount he drinks   Drug use: No   Sexual activity: Not Currently  Other Topics Concern   Not on file  Social History Narrative   Lives with his sister   Right handed   Caffeine: mtn dew about a bottle a day, maybe 1 cup of coffee daily   Drinks lots of water   Social Determinants of Health   Financial Resource Strain: Not on file  Food Insecurity: Not on file  Transportation Needs: Not on file  Physical Activity: Not on file  Stress: Not on file  Social Connections: Not on file  Intimate Partner Violence: Not on file      PHYSICAL EXAM  There were no vitals filed for this visit.  There is no height or weight on file to calculate BMI.   Generalized: Well developed, in no acute distress  Cardiology: normal rate and rhythm, no murmur auscultated  Respiratory: clear to auscultation bilaterally    Neurological examination  Mentation: Alert, poor historian, oriented to time, place, history taking. Follows all commands speech and language fluent Cranial nerve II-XII: Eyes are glassy, Pupils were equal round reactive to light. Extraocular movements were full, visual field were full on confrontational test. Facial sensation  and strength were normal. Head turning and shoulder shrug  were normal and symmetric. Motor: The motor testing reveals 5 over 5 strength of all 4 extremities. Good symmetric motor tone is noted throughout.  Sensory: Sensory testing is intact to soft touch on all 4 extremities. No evidence of extinction is noted.  Coordination: Cerebellar testing reveals ataxic finger-nose-finger  Gait and station: Gait is normal.     DIAGNOSTIC DATA (LABS, IMAGING, TESTING) - I reviewed patient records, labs, notes, testing and imaging myself where available.  Lab Results  Component Value Date   WBC 19.1 (H) 04/11/2021   HGB 15.2 04/11/2021   HCT 43.9 04/11/2021   MCV 92.8 04/11/2021   PLT 209 04/11/2021      Component Value Date/Time   NA 137 04/11/2021 0140   K 4.0 04/11/2021 0140   CL 107 04/11/2021 0140   CO2 23 04/11/2021 0140   GLUCOSE 132 (H) 04/11/2021 0140   BUN 15 04/11/2021 0140   CREATININE 0.93 04/11/2021 0140   CALCIUM 8.2 (L) 04/11/2021 0140   PROT 7.4 04/11/2021 0140   ALBUMIN 3.7 04/11/2021 0140   AST 95 (H) 04/11/2021 0140   ALT 144 (H) 04/11/2021 0140   ALKPHOS 72 04/11/2021 0140   BILITOT 0.9 04/11/2021 0140   GFRNONAA >60 04/11/2021 0140   GFRAA >60 06/01/2020 2106   No results found for: CHOL, HDL, LDLCALC, LDLDIRECT, TRIG, CHOLHDL No results found for: IDPO2U No results found for: VITAMINB12 Lab Results  Component Value Date   TSH 1.668 08/16/2020    No flowsheet data found.   No flowsheet data found.   ASSESSMENT AND PLAN  45 y.o. year old male  has a past medical history of Laceration of hand, left and Seizures (HCC). here with     No diagnosis found.  Yurem reports that since May he has taken levetiracetam 500mg  twice daily. I have significant concerns of non compliance and possible continued alcohol abuse. His eyes are glassy in the office, he is having a hard time providing details of employment  and is unclear of last seizure event. I have  expressed concerns in the office. I have explained the importance of medication compliance and avoidance of alcohol. I have advised that he continue levetiracetam 500mg  twie daily. I have called in refills for 3 months. Seizure precautions reviewed. I advise that he not drive. He was instructed to have human resources for his employer send me a list of specific job duties so that I may assess safety and make recommendations for any needed accomodations. I have asked that he return in 3 months for follow up. He verbalizes understanding and agreement with this plan.   No orders of the defined types were placed in this encounter.    No orders of the defined types were placed in this encounter.   , MSN, FNP-C 06/03/2021, 10:56 AM  [facility] [address] [place], [place] [phone]

## 2021-06-04 ENCOUNTER — Encounter: Payer: Self-pay | Admitting: Family Medicine

## 2021-06-04 ENCOUNTER — Ambulatory Visit: Payer: Self-pay | Admitting: Family Medicine

## 2021-06-04 DIAGNOSIS — G40209 Localization-related (focal) (partial) symptomatic epilepsy and epileptic syndromes with complex partial seizures, not intractable, without status epilepticus: Secondary | ICD-10-CM

## 2021-08-24 ENCOUNTER — Other Ambulatory Visit: Payer: Self-pay

## 2021-08-24 MED ORDER — LEVETIRACETAM 500 MG PO TABS: 500.0000 mg | ORAL_TABLET | Freq: Two times a day (BID) | ORAL | 0 refills | Status: DC

## 2021-12-02 ENCOUNTER — Emergency Department (HOSPITAL_COMMUNITY)
Admission: EM | Admit: 2021-12-02 | Discharge: 2021-12-02 | Disposition: A | Discharge status: Home / Self Care | Payer: 59 | Attending: Emergency Medicine | Admitting: Emergency Medicine

## 2021-12-02 ENCOUNTER — Encounter (HOSPITAL_COMMUNITY): Payer: Self-pay | Admitting: Emergency Medicine

## 2021-12-02 ENCOUNTER — Other Ambulatory Visit: Payer: Self-pay

## 2021-12-02 ENCOUNTER — Emergency Department (HOSPITAL_COMMUNITY): Payer: 59

## 2021-12-02 DIAGNOSIS — R42 Dizziness and giddiness: Secondary | ICD-10-CM | POA: Diagnosis not present

## 2021-12-02 DIAGNOSIS — Z8669 Personal history of other diseases of the nervous system and sense organs: Secondary | ICD-10-CM

## 2021-12-02 LAB — RAPID URINE DRUG SCREEN, HOSP PERFORMED
Amphetamines: NOT DETECTED
Barbiturates: NOT DETECTED
Benzodiazepines: NOT DETECTED
Cocaine: NOT DETECTED
Opiates: NOT DETECTED
Tetrahydrocannabinol: NOT DETECTED

## 2021-12-02 LAB — CBC WITH DIFFERENTIAL/PLATELET
Abs Immature Granulocytes: 0.04 10*3/uL (ref 0.00–0.07)
Basophils Absolute: 0 10*3/uL (ref 0.0–0.1)
Basophils Relative: 1 %
Eosinophils Absolute: 0.2 10*3/uL (ref 0.0–0.5)
Eosinophils Relative: 2 %
HCT: 44.8 % (ref 39.0–52.0)
Hemoglobin: 15.3 g/dL (ref 13.0–17.0)
Immature Granulocytes: 1 %
Lymphocytes Relative: 27 %
Lymphs Abs: 1.9 10*3/uL (ref 0.7–4.0)
MCH: 31.9 pg (ref 26.0–34.0)
MCHC: 34.2 g/dL (ref 30.0–36.0)
MCV: 93.3 fL (ref 80.0–100.0)
Monocytes Absolute: 0.6 10*3/uL (ref 0.1–1.0)
Monocytes Relative: 8 %
Neutro Abs: 4.3 10*3/uL (ref 1.7–7.7)
Neutrophils Relative %: 61 %
Platelets: 217 10*3/uL (ref 150–400)
RBC: 4.8 MIL/uL (ref 4.22–5.81)
RDW: 13.7 % (ref 11.5–15.5)
WBC: 7 10*3/uL (ref 4.0–10.5)
nRBC: 0 % (ref 0.0–0.2)

## 2021-12-02 LAB — URINALYSIS, ROUTINE W REFLEX MICROSCOPIC
Bilirubin Urine: NEGATIVE
Glucose, UA: NEGATIVE mg/dL
Hgb urine dipstick: NEGATIVE
Ketones, ur: NEGATIVE mg/dL
Leukocytes,Ua: NEGATIVE
Nitrite: NEGATIVE
Protein, ur: NEGATIVE mg/dL
Specific Gravity, Urine: 1.012 (ref 1.005–1.030)
pH: 5 (ref 5.0–8.0)

## 2021-12-02 LAB — COMPREHENSIVE METABOLIC PANEL
ALT: 138 U/L — ABNORMAL HIGH (ref 0–44)
AST: 114 U/L — ABNORMAL HIGH (ref 15–41)
Albumin: 3.7 g/dL (ref 3.5–5.0)
Alkaline Phosphatase: 100 U/L (ref 38–126)
Anion gap: 11 (ref 5–15)
BUN: 14 mg/dL (ref 6–20)
CO2: 23 mmol/L (ref 22–32)
Calcium: 8.8 mg/dL — ABNORMAL LOW (ref 8.9–10.3)
Chloride: 103 mmol/L (ref 98–111)
Creatinine, Ser: 0.91 mg/dL (ref 0.61–1.24)
GFR, Estimated: >60 mL/min (ref >60)
Glucose, Bld: 99 mg/dL (ref 70–99)
Potassium: 4 mmol/L (ref 3.5–5.1)
Sodium: 137 mmol/L (ref 135–145)
Total Bilirubin: 0.6 mg/dL (ref 0.3–1.2)
Total Protein: 7.2 g/dL (ref 6.5–8.1)

## 2021-12-02 MED ORDER — LEVETIRACETAM 500 MG PO TABS: 500.0000 mg | ORAL_TABLET | Freq: Two times a day (BID) | ORAL | 0 refills | Status: DC

## 2021-12-02 MED ORDER — LEVETIRACETAM 500 MG PO TABS
1000.0000 mg | ORAL_TABLET | Freq: Once | ORAL | Status: AC
  Administered 2021-12-02: 1000 mg via ORAL
  Filled 2021-12-02: qty 2

## 2021-12-02 NOTE — ED Provider Triage Note (Signed)
Emergency Medicine Provider Triage Evaluation Note ? ?Lucas Williams , a 46 y.o. male  was evaluated in triage.  Pt complains of change in vision and feels slightly dizzy.  Patient states that started around 1115 tonight, states that the blurred vision is bilateral, states he feels he but just feels like there is a film over, he has no eye pain he has a slight headache in the back of his head, nonthrobbing, no paresthesias or weakness upper or lower extremities, no recent head trauma, not on anticoag.  He states he has a history of seizures and has not been taking his Keppra due to financial issues.  He has no other complaints. ? ?Review of Systems  ?Positive: Headaches, dizziness ?Negative: Paresthesias, weakness ? ?Physical Exam  ?BP (!) 132/91 (BP Location: Right Arm)   Pulse 80   Temp 97.7 ?F (36.5 ?C) (Oral)   Resp (!) 22   Ht 6' (1.829 m)   Wt 109.3 kg   SpO2 100%   BMI 32.68 kg/m?  ?Gen:   Awake, no distress   ?Resp:  Normal effort  ?MSK:   Moves extremities without difficulty  ?Other:  Cranial nerves II through XII grossly intact visual acuity intact no unilateral weakness no difficulty word finding no slurring of his words gait fully intact ? ?Medical Decision Making  ?Medically screening exam initiated at 1:55 AM.  Appropriate orders placed.  Lucas Williams was informed that the remainder of the evaluation will be completed by another provider, this initial triage assessment does not replace that evaluation, and the importance of remaining in the ED until their evaluation is complete. ? ?Presents with dizziness and headache, patient will need further work-up in the emergency department. ?  ?Carroll Sage, PA-C ?12/02/21 0158 ? ?

## 2021-12-02 NOTE — ED Provider Notes (Signed)
?[facility] EMERGENCY DEPARTMENT ?Provider Note ? ? ?CSN: [id number] ?Arrival date & time: 12/02/21  0059 ? ?  ? ?History ? ?Chief Complaint  ?Patient presents with  ? Dizziness  ? ? ?Lucas Williams is a 46 y.o. male. ? ?Patient is a 46 year old male with past medical history of seizure disorder.  Patient presenting here with complaints of dizziness, lightheadedness, and feeling as if he was going to have a seizure.  Patient had a similar episode just prior to the seizure in July of last year.  Patient was at work when the symptoms began.  He denies any injury or trauma.  He tells me he has been out of his Keppra for the past week and a half due to financial reasons and being unable to see his neurologist secondary to this.  He has been on this medication for the past 4 years. ? ?The history is provided by the patient.  ?Dizziness ?Quality:  Lightheadedness ?Severity:  Moderate ?Onset quality:  Sudden ?Timing:  Constant ?Progression:  Resolved ?Relieved by:  Nothing ?Worsened by:  Nothing ? ?  ? ?Home Medications ?Prior to Admission medications   ?Medication Sig Start Date End Date Taking? Authorizing Provider  ?ibuprofen (ADVIL) 600 MG tablet Take 1 tablet (600 mg total) by mouth every 6 (six) hours as needed. ?Patient not taking: Reported on 04/11/2021 05/10/20   Elpidio Anis, PA-C  ?levETIRAcetam (KEPPRA) 500 MG tablet Take 1 tablet (500 mg total) by mouth 2 (two) times daily. Please call and make overdue appt for further refills 08/24/21   Shawnie Dapper, NP  ?Multiple Vitamin (MULTIVITAMIN ADULT) TABS Take 1 tablet by mouth daily.    [provider]  ?   ? ?Allergies    ?Patient has no known allergies.   ? ?Review of Systems   ?Review of Systems  ?Neurological:  Positive for dizziness.  ?All other systems reviewed and are negative. ? ?Physical Exam ?Updated Vital Signs ?BP (!) 155/84   Pulse 86   Temp 97.7 ?F (36.5 ?C) (Oral)   Resp 17   Ht 6' (1.829 m)   Wt 109.3 kg   SpO2 98%    BMI 32.68 kg/m?  ?Physical Exam ?Vitals and nursing note reviewed.  ?Constitutional:   ?   General: He is not in acute distress. ?   Appearance: He is well-developed. He is not diaphoretic.  ?HENT:  ?   Head: Normocephalic and atraumatic.  ?Eyes:  ?   Extraocular Movements: Extraocular movements intact.  ?   Pupils: Pupils are equal, round, and reactive to light.  ?Cardiovascular:  ?   Rate and Rhythm: Normal rate and regular rhythm.  ?   Heart sounds: No murmur heard. ?  No friction rub.  ?Pulmonary:  ?   Effort: Pulmonary effort is normal. No respiratory distress.  ?   Breath sounds: Normal breath sounds. No wheezing or rales.  ?Abdominal:  ?   General: Bowel sounds are normal. There is no distension.  ?   Palpations: Abdomen is soft.  ?   Tenderness: There is no abdominal tenderness.  ?Musculoskeletal:     ?   General: Normal range of motion.  ?   Cervical back: Normal range of motion and neck supple.  ?Skin: ?   General: Skin is warm and dry.  ?Neurological:  ?   General: No focal deficit present.  ?   Mental Status: He is alert and oriented to person, place, and time.  ?  Cranial Nerves: No cranial nerve deficit.  ?   Motor: No weakness.  ?   Coordination: Coordination normal.  ? ? ?ED Results / Procedures / Treatments   ?Labs ?(all labs ordered are listed, but only abnormal results are displayed) ?Labs Reviewed  ?COMPREHENSIVE METABOLIC PANEL - Abnormal; Notable for the following components:  ?    Result Value  ? Calcium 8.8 (*)   ? AST 114 (*)   ? ALT 138 (*)   ? All other components within normal limits  ?CBC WITH DIFFERENTIAL/PLATELET  ?URINALYSIS, ROUTINE W REFLEX MICROSCOPIC  ?RAPID URINE DRUG SCREEN, HOSP PERFORMED  ?CBG MONITORING, ED  ? ? ?EKG ?None ? ?Radiology ?CT Head Wo Contrast ? ?Result Date: 12/02/2021 ?CLINICAL DATA:  Transient ischemic attack EXAM: CT HEAD WITHOUT CONTRAST TECHNIQUE: Contiguous axial images were obtained from the base of the skull through the vertex without intravenous  contrast. RADIATION DOSE REDUCTION: This exam was performed according to the departmental dose-optimization program which includes automated exposure control, adjustment of the mA and/or kV according to patient size and/or use of iterative reconstruction technique. COMPARISON:  04/11/2021 FINDINGS: Brain: Focal areas of encephalomalacia in the bilateral frontal lobes, right anterior temporal lobe, and right parietal lobes consistent with old infarcts. No change since prior study. No mass-effect or midline shift. No abnormal extra-axial fluid collections. Basal cisterns are not effaced. Ventricular dilatation is consistent with central atrophy. No acute intracranial hemorrhage. Vascular: Mild intracranial vascular calcifications. Skull: Normal. Negative for fracture or focal lesion. Sinuses/Orbits: No acute finding. Other: None. IMPRESSION: No acute intracranial abnormalities. 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 Mild cortical atrophy. Electronically Signed   By: Burman NievesWilliam  Stevens M.D.   On: 12/02/2021 02:33   ? ?Procedures ?Procedures  ? ? ?Medications Ordered in ED ?Medications  ?levETIRAcetam (KEPPRA) tablet 1,000 mg (has no administration in time range)  ? ? ?ED Course/ Medical Decision Making/ A&P ? ?This patient presents to the ED for concern of dizziness, this involves an extensive number of treatment options, and is a complaint that carries with it a high risk of complications and morbidity.  The differential diagnosis includes migraine, anemia, seizure aura ? ? ?Co morbidities that complicate the patient evaluation ? ?None ? ? ?Additional history obtained: ? ?None ? ? ?Lab Tests: ? ?I Ordered, and personally interpreted labs.  The pertinent results include: Unremarkable CBC.  CMP shows mild elevation of LFTs which seems to be chronic.  Urinalysis and drug screen clear. ? ? ?Imaging Studies ordered: ? ?I ordered imaging studies including CT of the head ?I independently visualized  and interpreted imaging which showed no acute process ?I agree with the radiologist interpretation ? ? ?Cardiac Monitoring: ? ?None ? ? ?Medicines ordered and prescription drug management: ? ?I ordered medication including Keppra for seizure prevention ?Reevaluation of the patient after these medicines showed that the patient improved ?I have reviewed the patients home medicines and have made adjustments as needed ? ? ?Test Considered: ? ?None ? ? ?Critical Interventions: ? ?None ? ? ?Consultations Obtained: ? ?No emergent consultations indicated ? ? ?Problem List / ED Course: ? ?Patient presenting with a feeling of dizziness and blurry vision, both of which occurred before his last seizure in July.  He has been off of his Keppra for the past 10 days as he is run out of this medication and does not have access to his neurologist.  Patient will be given Keppra here in the ER and discharged with a  refill of this medication.  He is to follow-up with a primary doctor and return if symptoms worsen. ?He is neurologically intact, feels subjectively better, and labs and head CT are unremarkable. ? ? ? ?Social Determinants of Health: ? ?None ? ? ? ? ?Final Clinical Impression(s) / ED Diagnoses ?Final diagnoses:  ?None  ? ? ?Rx / DC Orders ?ED Discharge Orders   ? ? None  ? ?  ? ? ?  ?Geoffery Lyons, MD ?12/02/21 0503 ? ?

## 2021-12-02 NOTE — ED Triage Notes (Signed)
Pt states he has hx of seizures, states he is been having blurring vision, dizziness and he feels like he is going to have a seizures. Pt states he is not able to follow up with neurology and he is not taking his Kepra as ordered. ?

## 2021-12-02 NOTE — Discharge Instructions (Addendum)
Resume taking Keppra as previously prescribed.  A refill of this medication has been given to you here in the ER. ? ?Follow-up with your neurologist/primary doctor as needed for future refills of this medication. ? ?Return to the ER symptoms significantly worsen or change. ?

## 2021-12-14 ENCOUNTER — Emergency Department (HOSPITAL_COMMUNITY)
Admission: EM | Admit: 2021-12-14 | Discharge: 2021-12-15 | Disposition: A | Discharge status: Home / Self Care | Payer: 59 | Attending: Emergency Medicine | Admitting: Emergency Medicine

## 2021-12-14 ENCOUNTER — Encounter (HOSPITAL_COMMUNITY): Payer: Self-pay

## 2021-12-14 DIAGNOSIS — F10129 Alcohol abuse with intoxication, unspecified: Secondary | ICD-10-CM | POA: Diagnosis not present

## 2021-12-14 DIAGNOSIS — Y908 Blood alcohol level of 240 mg/100 ml or more: Secondary | ICD-10-CM | POA: Diagnosis not present

## 2021-12-14 DIAGNOSIS — F10920 Alcohol use, unspecified with intoxication, uncomplicated: Secondary | ICD-10-CM

## 2021-12-14 LAB — CBC WITH DIFFERENTIAL/PLATELET
Abs Immature Granulocytes: 0.02 10*3/uL (ref 0.00–0.07)
Basophils Absolute: 0.1 10*3/uL (ref 0.0–0.1)
Basophils Relative: 1 %
Eosinophils Absolute: 0.2 10*3/uL (ref 0.0–0.5)
Eosinophils Relative: 2 %
HCT: 41.6 % (ref 39.0–52.0)
Hemoglobin: 14.6 g/dL (ref 13.0–17.0)
Immature Granulocytes: 0 %
Lymphocytes Relative: 44 %
Lymphs Abs: 3.5 10*3/uL (ref 0.7–4.0)
MCH: 32.5 pg (ref 26.0–34.0)
MCHC: 35.1 g/dL (ref 30.0–36.0)
MCV: 92.7 fL (ref 80.0–100.0)
Monocytes Absolute: 0.7 10*3/uL (ref 0.1–1.0)
Monocytes Relative: 9 %
Neutro Abs: 3.6 10*3/uL (ref 1.7–7.7)
Neutrophils Relative %: 44 %
Platelets: 224 10*3/uL (ref 150–400)
RBC: 4.49 MIL/uL (ref 4.22–5.81)
RDW: 13.8 % (ref 11.5–15.5)
WBC: 8.1 10*3/uL (ref 4.0–10.5)
nRBC: 0 % (ref 0.0–0.2)

## 2021-12-14 LAB — COMPREHENSIVE METABOLIC PANEL
ALT: 122 U/L — ABNORMAL HIGH (ref 0–44)
AST: 93 U/L — ABNORMAL HIGH (ref 15–41)
Albumin: 3.8 g/dL (ref 3.5–5.0)
Alkaline Phosphatase: 83 U/L (ref 38–126)
Anion gap: 10 (ref 5–15)
BUN: 10 mg/dL (ref 6–20)
CO2: 26 mmol/L (ref 22–32)
Calcium: 8.4 mg/dL — ABNORMAL LOW (ref 8.9–10.3)
Chloride: 105 mmol/L (ref 98–111)
Creatinine, Ser: 0.9 mg/dL (ref 0.61–1.24)
GFR, Estimated: >60 mL/min (ref >60)
Glucose, Bld: 103 mg/dL — ABNORMAL HIGH (ref 70–99)
Potassium: 3.9 mmol/L (ref 3.5–5.1)
Sodium: 141 mmol/L (ref 135–145)
Total Bilirubin: 0.7 mg/dL (ref 0.3–1.2)
Total Protein: 7.7 g/dL (ref 6.5–8.1)

## 2021-12-14 LAB — ACETAMINOPHEN LEVEL: Acetaminophen (Tylenol), Serum: <10 ug/mL — ABNORMAL LOW (ref 10–30)

## 2021-12-14 LAB — MAGNESIUM: Magnesium: 2.4 mg/dL (ref 1.7–2.4)

## 2021-12-14 LAB — ETHANOL: Alcohol, Ethyl (B): 495 mg/dL (ref <10)

## 2021-12-14 LAB — SALICYLATE LEVEL: Salicylate Lvl: <7 mg/dL — ABNORMAL LOW (ref 7.0–30.0)

## 2021-12-14 LAB — CBG MONITORING, ED: Glucose-Capillary: 90 mg/dL (ref 70–99)

## 2021-12-14 MED ORDER — SODIUM CHLORIDE 0.9 % IV SOLN: INTRAVENOUS | Status: DC

## 2021-12-14 MED ORDER — NICOTINE 21 MG/24HR TD PT24
21.0000 mg | MEDICATED_PATCH | Freq: Once | TRANSDERMAL | Status: DC
  Administered 2021-12-14: 21 mg via TRANSDERMAL
  Filled 2021-12-14: qty 1

## 2021-12-14 MED ORDER — SODIUM CHLORIDE 0.9 % IV BOLUS
1000.0000 mL | Freq: Once | INTRAVENOUS | Status: AC
  Administered 2021-12-14: 1000 mL via INTRAVENOUS

## 2021-12-14 NOTE — ED Provider Notes (Signed)
?Lucas Williams ?Arrival date & time: 12/14/21  1447 ? ?  ? ?History ? ?Chief Complaint  ?Patient presents with  ? Alcohol Intoxication  ? ? ?Lucas Williams is a 46 y.o. male. ? ?Pt is a 46 yo male with a hx of seizures and alcohol abuse.  EMS was called this afternoon because pt was on the side of the road.  The pt admits to drinking 2 pints of alcohol.  He denies any pain, falls, or any other sx.  He said he remembers how he got to the road, but does not say how.   ? ? ?  ? ?Home Medications ?Prior to Admission medications   ?Medication Sig Start Date End Date Taking? Authorizing Provider  ?ibuprofen (ADVIL) 600 MG tablet Take 1 tablet (600 mg total) by mouth every 6 (six) hours as needed. ?Patient not taking: Reported on 04/11/2021 05/10/20   Elpidio Anis, PA-C  ?levETIRAcetam (KEPPRA) 500 MG tablet Take 1 tablet (500 mg total) by mouth 2 (two) times daily. Please call and make overdue appt for further refills 12/02/21   Geoffery Lyons, MD  ?Multiple Vitamin (MULTIVITAMIN ADULT) TABS Take 1 tablet by mouth daily.    [provider]  ?   ? ?Allergies    ?Patient has no known allergies.   ? ?Review of Systems   ?Review of Systems  ?All other systems reviewed and are negative. ? ?Physical Exam ?Updated Vital Signs ?BP 123/72   Pulse 72   Temp 98 ?F (36.7 ?C) (Oral)   Resp 16   SpO2 100%  ?Physical Exam ?Vitals and nursing note reviewed.  ?Constitutional:   ?   Appearance: Normal appearance. He is obese.  ?HENT:  ?   Head: Normocephalic and atraumatic.  ?   Right Ear: External ear normal.  ?   Left Ear: External ear normal.  ?   Nose: Nose normal.  ?   Mouth/Throat:  ?   Mouth: Mucous membranes are dry.  ?Eyes:  ?   Extraocular Movements: Extraocular movements intact.  ?   Conjunctiva/sclera: Conjunctivae normal.  ?   Pupils: Pupils are equal, round, and reactive to light.  ?Cardiovascular:  ?   Rate and Rhythm: Normal rate and regular  rhythm.  ?   Pulses: Normal pulses.  ?   Heart sounds: Normal heart sounds.  ?Pulmonary:  ?   Effort: Pulmonary effort is normal.  ?   Breath sounds: Normal breath sounds.  ?Abdominal:  ?   General: Abdomen is flat. Bowel sounds are normal.  ?   Palpations: Abdomen is soft.  ?Musculoskeletal:     ?   General: Normal range of motion.  ?   Cervical back: Normal range of motion and neck supple.  ?Skin: ?   General: Skin is warm.  ?   Capillary Refill: Capillary refill takes less than 2 seconds.  ?Neurological:  ?   General: No focal deficit present.  ?   Mental Status: He is alert and oriented to person, place, and time.  ?Psychiatric:     ?   Mood and Affect: Mood normal.     ?   Behavior: Behavior normal.  ? ? ?ED Results / Procedures / Treatments   ?Labs ?(all labs ordered are listed, but only abnormal results are displayed) ?Labs Reviewed  ?COMPREHENSIVE METABOLIC PANEL - Abnormal; Notable for the following components:  ?    Result Value  ? Glucose, Bld 103 (*)   ?  Calcium 8.4 (*)   ? AST 93 (*)   ? ALT 122 (*)   ? All other components within normal limits  ?SALICYLATE LEVEL - Abnormal; Notable for the following components:  ? Salicylate Lvl <7.0 (*)   ? All other components within normal limits  ?ACETAMINOPHEN LEVEL - Abnormal; Notable for the following components:  ? Acetaminophen (Tylenol), Serum <10 (*)   ? All other components within normal limits  ?ETHANOL - Abnormal; Notable for the following components:  ? Alcohol, Ethyl (B) 495 (*)   ? All other components within normal limits  ?CBC WITH DIFFERENTIAL/PLATELET  ?MAGNESIUM  ?RAPID URINE DRUG SCREEN, HOSP PERFORMED  ?CBG MONITORING, ED  ? ? ?EKG ?None ? ?Radiology ?No results found. ? ?Procedures ?Procedures  ? ? ?Medications Ordered in ED ?Medications  ?nicotine (NICODERM CQ - dosed in mg/24 hours) patch 21 mg (21 mg Transdermal Patch Applied 12/14/21 1801)  ?sodium chloride 0.9 % bolus 1,000 mL (0 mLs Intravenous Stopped 12/14/21 1740)  ? ? ?ED Course/  Medical Decision Making/ A&P ?  ?                        ?Medical Decision Making ?Amount and/or Complexity of Data Reviewed ?Labs: ordered. ? ?Risk ?OTC drugs. ?Prescription drug management. ? ? ?This patient presents to the ED for concern of AMS, this involves an extensive number of treatment options, and is a complaint that carries with it a high risk of complications and morbidity.  The differential diagnosis includes alcohol intox, electrolyte abn ? ? ?Co morbidities that complicate the patient evaluation ? ?Seizure d/o and etoh abuse ? ? ?Additional history obtained: ? ?Additional history obtained from epic chart review ?External records from outside source obtained and reviewed including EMS report ? ? ?Lab Tests: ? ?I Ordered, and personally interpreted labs.  The pertinent results include:  cmp nl, acet and sal level neg, etoh elevated at 495, Mg nl ? ? ? ?Cardiac Monitoring: ? ?The patient was maintained on a cardiac monitor.  I personally viewed and interpreted the cardiac monitored which showed an underlying rhythm of: nsr ? ? ? ?Problem List / ED Course: ? ?Alcohol intox:  pt said he has no one who can pick him up.  He is able to ambulate, but is very unsteady and I don't think he can go without anyone to help him.  He will stay here until he is more steady on his feet and then can go. ? ? ?Reevaluation: ? ?After the interventions noted above, I reevaluated the patient and found that they have :improved ? ? ?Social Determinants of Health: ? ?Alcohol abuse ? ? ?Dispostion: ? ?After consideration of the diagnostic results and the patients response to treatment, I feel that the patent would benefit from discharge when less intoxicated.   ? ? ? ? ? ? ? ?Final Clinical Impression(s) / ED Diagnoses ?Final diagnoses:  ?Alcoholic intoxication without complication (HCC)  ? ? ?Rx / DC Orders ?ED Discharge Orders   ? ? None  ? ?  ? ? ?  ?Jacalyn Lefevre, MD ?12/14/21 2309 ? ?

## 2021-12-14 NOTE — ED Notes (Signed)
495 ethanol ?

## 2021-12-14 NOTE — ED Triage Notes (Signed)
Pt arrived via EMS, from side of road, bystanders called out of concern. Admits to ETOH.  ?

## 2021-12-14 NOTE — ED Provider Notes (Signed)
11:14 PM ?Assumed care from Dr. Particia Nearing, please see their note for full history, physical and decision making until this point. In brief this is a 46 y.o. year old male who presented to the ED tonight with Alcohol Intoxication ?    ?Needs to sober up. Or get ride.  ? ?Ambulated effectively with steady gait. No emesis. Most of EtOH should have metabolized by now as well. Stable for discharge.  ? ?Discharge instructions, including strict return precautions for new or worsening symptoms, given. Patient and/or family verbalized understanding and agreement with the plan as described.  ? ?Labs, studies and imaging reviewed by myself and considered in medical decision making if ordered. Imaging interpreted by radiology. ? ?Labs Reviewed  ?COMPREHENSIVE METABOLIC PANEL - Abnormal; Notable for the following components:  ?    Result Value  ? Glucose, Bld 103 (*)   ? Calcium 8.4 (*)   ? AST 93 (*)   ? ALT 122 (*)   ? All other components within normal limits  ?SALICYLATE LEVEL - Abnormal; Notable for the following components:  ? Salicylate Lvl <7.0 (*)   ? All other components within normal limits  ?ACETAMINOPHEN LEVEL - Abnormal; Notable for the following components:  ? Acetaminophen (Tylenol), Serum <10 (*)   ? All other components within normal limits  ?ETHANOL - Abnormal; Notable for the following components:  ? Alcohol, Ethyl (B) 495 (*)   ? All other components within normal limits  ?CBC WITH DIFFERENTIAL/PLATELET  ?MAGNESIUM  ?RAPID URINE DRUG SCREEN, HOSP PERFORMED  ?CBG MONITORING, ED  ? ? ?No orders to display  ? ? ?No follow-ups on file. ? ?  ?Marily Memos, MD ?12/15/21 [phone] ? ?

## 2021-12-14 NOTE — ED Notes (Signed)
Critical Lab ?Ethanol 495  ?Reported to [employer] ?

## 2021-12-15 NOTE — ED Notes (Signed)
Patient awake and ambulatory to restroom.

## 2021-12-18 ENCOUNTER — Inpatient Hospital Stay (HOSPITAL_COMMUNITY): Payer: 59

## 2021-12-18 ENCOUNTER — Emergency Department (HOSPITAL_COMMUNITY): Payer: 59

## 2021-12-18 ENCOUNTER — Inpatient Hospital Stay (HOSPITAL_COMMUNITY)
Admission: EM | Admit: 2021-12-18 | Discharge: 2021-12-20 | DRG: 897 | Disposition: A | Discharge status: Home / Self Care | Payer: 59 | Attending: Family Medicine | Admitting: Family Medicine

## 2021-12-18 ENCOUNTER — Other Ambulatory Visit: Payer: Self-pay

## 2021-12-18 DIAGNOSIS — F17210 Nicotine dependence, cigarettes, uncomplicated: Secondary | ICD-10-CM | POA: Diagnosis present

## 2021-12-18 DIAGNOSIS — R569 Unspecified convulsions: Principal | ICD-10-CM

## 2021-12-18 DIAGNOSIS — K7010 Alcoholic hepatitis without ascites: Secondary | ICD-10-CM | POA: Diagnosis present

## 2021-12-18 DIAGNOSIS — F10939 Alcohol use, unspecified with withdrawal, unspecified: Secondary | ICD-10-CM | POA: Diagnosis present

## 2021-12-18 DIAGNOSIS — F10932 Alcohol use, unspecified with withdrawal with perceptual disturbance: Secondary | ICD-10-CM

## 2021-12-18 DIAGNOSIS — G40909 Epilepsy, unspecified, not intractable, without status epilepticus: Secondary | ICD-10-CM | POA: Diagnosis present

## 2021-12-18 DIAGNOSIS — R7302 Impaired glucose tolerance (oral): Secondary | ICD-10-CM | POA: Diagnosis present

## 2021-12-18 DIAGNOSIS — Z87820 Personal history of traumatic brain injury: Secondary | ICD-10-CM

## 2021-12-18 DIAGNOSIS — Z79899 Other long term (current) drug therapy: Secondary | ICD-10-CM | POA: Diagnosis not present

## 2021-12-18 DIAGNOSIS — F10139 Alcohol abuse with withdrawal, unspecified: Principal | ICD-10-CM | POA: Diagnosis present

## 2021-12-18 DIAGNOSIS — E876 Hypokalemia: Secondary | ICD-10-CM | POA: Diagnosis present

## 2021-12-18 LAB — CBC WITH DIFFERENTIAL/PLATELET
Abs Immature Granulocytes: 0.03 10*3/uL (ref 0.00–0.07)
Basophils Absolute: 0 10*3/uL (ref 0.0–0.1)
Basophils Relative: 1 %
Eosinophils Absolute: 0 10*3/uL (ref 0.0–0.5)
Eosinophils Relative: 0 %
HCT: 41.4 % (ref 39.0–52.0)
Hemoglobin: 13.8 g/dL (ref 13.0–17.0)
Immature Granulocytes: 0 %
Lymphocytes Relative: 12 %
Lymphs Abs: 1 10*3/uL (ref 0.7–4.0)
MCH: 30.9 pg (ref 26.0–34.0)
MCHC: 33.3 g/dL (ref 30.0–36.0)
MCV: 92.8 fL (ref 80.0–100.0)
Monocytes Absolute: 0.9 10*3/uL (ref 0.1–1.0)
Monocytes Relative: 11 %
Neutro Abs: 6.1 10*3/uL (ref 1.7–7.7)
Neutrophils Relative %: 76 %
Platelets: 191 10*3/uL (ref 150–400)
RBC: 4.46 MIL/uL (ref 4.22–5.81)
RDW: 13.3 % (ref 11.5–15.5)
WBC: 8.1 10*3/uL (ref 4.0–10.5)
nRBC: 0 % (ref 0.0–0.2)

## 2021-12-18 LAB — COMPREHENSIVE METABOLIC PANEL
ALT: 150 U/L — ABNORMAL HIGH (ref 0–44)
AST: 224 U/L — ABNORMAL HIGH (ref 15–41)
Albumin: 3.9 g/dL (ref 3.5–5.0)
Alkaline Phosphatase: 92 U/L (ref 38–126)
Anion gap: 13 (ref 5–15)
BUN: 12 mg/dL (ref 6–20)
CO2: 22 mmol/L (ref 22–32)
Calcium: 9 mg/dL (ref 8.9–10.3)
Chloride: 101 mmol/L (ref 98–111)
Creatinine, Ser: 1.04 mg/dL (ref 0.61–1.24)
GFR, Estimated: >60 mL/min (ref >60)
Glucose, Bld: 136 mg/dL — ABNORMAL HIGH (ref 70–99)
Potassium: 4 mmol/L (ref 3.5–5.1)
Sodium: 136 mmol/L (ref 135–145)
Total Bilirubin: 1 mg/dL (ref 0.3–1.2)
Total Protein: 7.3 g/dL (ref 6.5–8.1)

## 2021-12-18 LAB — ETHANOL: Alcohol, Ethyl (B): <10 mg/dL (ref <10)

## 2021-12-18 LAB — MAGNESIUM: Magnesium: 2.2 mg/dL (ref 1.7–2.4)

## 2021-12-18 LAB — MRSA NEXT GEN BY PCR, NASAL: MRSA by PCR Next Gen: NOT DETECTED

## 2021-12-18 MED ORDER — THIAMINE HCL 100 MG/ML IJ SOLN: 100.0000 mg | Freq: Every day | INTRAMUSCULAR | Status: DC

## 2021-12-18 MED ORDER — LEVETIRACETAM IN NACL 1000 MG/100ML IV SOLN
1000.0000 mg | Freq: Once | INTRAVENOUS | Status: AC
Start: 2021-12-18 — End: 2021-12-18
  Administered 2021-12-18: 1000 mg via INTRAVENOUS
  Filled 2021-12-18: qty 100

## 2021-12-18 MED ORDER — LORAZEPAM 2 MG/ML IJ SOLN
1.0000 mg | Freq: Once | INTRAMUSCULAR | Status: AC
  Administered 2021-12-18: 1 mg via INTRAVENOUS
  Filled 2021-12-18: qty 1

## 2021-12-18 MED ORDER — LEVETIRACETAM 500 MG PO TABS
500.0000 mg | ORAL_TABLET | Freq: Two times a day (BID) | ORAL | Status: DC
  Administered 2021-12-18 – 2021-12-20 (×5): 500 mg via ORAL
  Filled 2021-12-18 (×5): qty 1

## 2021-12-18 MED ORDER — LORAZEPAM 2 MG/ML IJ SOLN: 1.0000 mg | INTRAMUSCULAR | Status: DC | PRN

## 2021-12-18 MED ORDER — FOLIC ACID 1 MG PO TABS: 1.0000 mg | ORAL_TABLET | Freq: Every day | ORAL | Status: DC

## 2021-12-18 MED ORDER — CHLORDIAZEPOXIDE HCL 25 MG PO CAPS
25.0000 mg | ORAL_CAPSULE | ORAL | Status: AC
  Administered 2021-12-18: 25 mg via ORAL
  Filled 2021-12-18: qty 1

## 2021-12-18 MED ORDER — THIAMINE HCL 100 MG PO TABS
100.0000 mg | ORAL_TABLET | Freq: Every day | ORAL | Status: DC
  Administered 2021-12-19 – 2021-12-20 (×2): 100 mg via ORAL
  Filled 2021-12-18 (×2): qty 1

## 2021-12-18 MED ORDER — HYDRALAZINE HCL 20 MG/ML IJ SOLN
5.0000 mg | INTRAMUSCULAR | Status: DC | PRN
  Filled 2021-12-18: qty 1

## 2021-12-18 MED ORDER — LORAZEPAM 2 MG/ML IJ SOLN: 0.0000 mg | Freq: Four times a day (QID) | INTRAMUSCULAR | Status: DC

## 2021-12-18 MED ORDER — THIAMINE HCL 100 MG PO TABS
100.0000 mg | ORAL_TABLET | Freq: Every day | ORAL | Status: DC
  Administered 2021-12-18: 100 mg via ORAL
  Filled 2021-12-18: qty 1

## 2021-12-18 MED ORDER — LORAZEPAM 1 MG PO TABS
1.0000 mg | ORAL_TABLET | ORAL | Status: DC | PRN
  Administered 2021-12-19: 1 mg via ORAL
  Filled 2021-12-18: qty 1

## 2021-12-18 MED ORDER — NICOTINE 7 MG/24HR TD PT24
7.0000 mg | MEDICATED_PATCH | Freq: Every day | TRANSDERMAL | Status: DC
  Administered 2021-12-18 – 2021-12-19 (×2): 7 mg via TRANSDERMAL
  Filled 2021-12-18 (×3): qty 1

## 2021-12-18 MED ORDER — LACTATED RINGERS IV BOLUS
1000.0000 mL | Freq: Once | INTRAVENOUS | Status: AC
Start: 2021-12-18 — End: 2021-12-18
  Administered 2021-12-18: 1000 mL via INTRAVENOUS

## 2021-12-18 MED ORDER — ENOXAPARIN SODIUM 40 MG/0.4ML IJ SOSY
40.0000 mg | PREFILLED_SYRINGE | INTRAMUSCULAR | Status: DC
  Administered 2021-12-19: 40 mg via SUBCUTANEOUS
  Filled 2021-12-18: qty 0.4

## 2021-12-18 MED ORDER — LORAZEPAM 1 MG PO TABS
0.0000 mg | ORAL_TABLET | Freq: Four times a day (QID) | ORAL | Status: DC
  Administered 2021-12-18: 1 mg via ORAL
  Filled 2021-12-18: qty 1

## 2021-12-18 MED ORDER — FOLIC ACID 1 MG PO TABS
1.0000 mg | ORAL_TABLET | Freq: Every day | ORAL | Status: DC
  Administered 2021-12-19 – 2021-12-20 (×2): 1 mg via ORAL
  Filled 2021-12-18 (×2): qty 1

## 2021-12-18 MED ORDER — ADULT MULTIVITAMIN W/MINERALS CH
1.0000 | ORAL_TABLET | Freq: Every day | ORAL | Status: DC
  Administered 2021-12-18 – 2021-12-20 (×3): 1 via ORAL
  Filled 2021-12-18 (×3): qty 1

## 2021-12-18 NOTE — H&P (Signed)
?History and Physical  ? ? ?Patient: Lucas Williams WUX:[id number] DOB: 11/08/1975 ?DOA: 12/18/2021 ?DOS: the patient was seen and examined on 12/18/2021 ?PCP: Patient, No Pcp Per (Inactive)  ?Patient coming from: Homeless ? ?Chief Complaint:  ?Chief Complaint  ?Patient presents with  ? Seizures  ? ?HPI: ? 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 ?Initially presented to emergency room 3/27 with alcohol intoxication did not have a ride-ambulated effectively out of the ED on 3/27 blood alcohol level was 495 ? ?Patient came in [facility] secondary to seizure--- patient states he does not typically drink much and is a little bit evasive tells me he drinks about 2-3 beers a day-states that he takes his Keppra twice a day and has been taking it regularly since 3/15 when he told the emergency room that he could not afford it and was not taking it ?He does not recall the events leading up to his issues and "blacked out"-he was found down and brought to the emergency room because of being found down ?He had been at the motel for 1 day postdischarge-he usually works with [employer] since November of last year-he has 2 adult children in [place] and is separated from his wife ? ?ED work-up reveals ethanol level of less than 10 drug screen is pending Keppra level is pending ?It appears that last time he was here 12/02/2021 he stated that he was out of his Keppra as he could not afford this? ? ? ?Work-up with CT head and neck are negative, BUNs/creatinine 12/1.04 ?AST/ALT 224/150 ?WBC 8.1 ?Hemoglobin 13.8 ? ?Review of Systems: As mentioned in the history of present illness. All other systems reviewed and are negative. ?Past Medical History:  ?Diagnosis Date  ? Laceration of hand, left   ? Seizures (HCC)   ? ?Past Surgical History:   ?Procedure Laterality Date  ? FINGER SURGERY    ? r/t staph infection I&D, hand lac cutting trees  ? HERNIA REPAIR    ? LT ihr  ? NERVE REPAIR Left 12/11/2012  ? Procedure: Left Hand Exploration Wound with Nerve Repair;  Surgeon: Tami Ribas, MD;  Location:  SURGERY CENTER;  Service: Orthopedics;  Laterality: Left;  ? ?Social History:  reports that he has been smoking. He has been smoking an average of .35 packs per day. He has never used smokeless tobacco. He reports current alcohol use of about 6.0 standard drinks per week. He reports that he does not use drugs. ? ?No Known Allergies ? ?Family History  ?Problem Relation Age of Onset  ? Seizures Neg Hx   ? ? ?Prior to Admission medications   ?Medication Sig Start Date End Date Taking? Authorizing Provider  ?ibuprofen (ADVIL) 600 MG tablet Take 1 tablet (600 mg total) by mouth every 6 (six) hours as needed. ?Patient not taking: Reported on 04/11/2021 05/10/20   Elpidio Anis, PA-C  ?levETIRAcetam (KEPPRA) 500 MG tablet Take 1 tablet (500 mg total) by mouth 2 (two) times daily. Please call and make overdue appt for further refills 12/02/21   Geoffery Lyons, MD  ?Multiple Vitamin (MULTIVITAMIN ADULT) TABS Take 1 tablet by mouth daily.    [provider]  ? ? ?Physical Exam: ?Vitals:  ? 12/18/21 1345 12/18/21 1444 12/18/21 1500 12/18/21 1515  ?BP: (!) 171/105 (!) 174/102 (!) 167/93 (!) 168/98  ?Pulse: (!) 105 Marland Kitchen)  108 (!) 103 (!) 103  ?Resp: (!) 23 (!) 26 (!) 25 (!) 29  ?Temp:      ?TempSrc:      ?SpO2: 99% 97% 95% 98%  ?Weight:      ?Height:      ? ?Awake coherent slightly jittery ?Mild shakes ?S1-S2 no murmur tachycardic low 100 range ?ROM intact ?Abdomen is soft distended I can feel his liver but I cannot feel his spleen ?None as yet ?No lower extremity edema ?He does have a bullous nose with a bruise on the left side of it ?Neck is soft and supple he does not have any paraspinal musculature discomfort-he is able to raise all 4 limbs above going  above the bed and has 5/5 power he has no restriction of motion he has no discomfort his reflexes are intact psych is euthymic although he is a little anxious. ? ?Data Reviewed: ? ?See above discussion ? ?Assessment and Plan: ?No notes have been filed under this hospital service. ?Service: Hospitalist ? ?Seizure ?Unclear if withdrawal related versus [place] from his seizure disorder ?Loaded with Keppra 1 g and continue 500 p.o. twice daily as long as he is able to keep down clears ?Would not place him on a full diet unless he is seizure-free free for 24 hours ?At this juncture no need for EEG or for further imaging however if he has another seizure he will have to be seen by neurology ?Patient found down so ED is ordering maxillofacial CT ?Patient will be started on IV metoprolol for blood pressure above 160-we may start p.o. 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 ? Advance Care Planning:   Code Status: Full Code  ? ?Consults: None ? ?Family Communication: No ? ?Severity of Illness: ?The appropriate patient status for this patient is INPATIENT. Inpatient status is judged to be reasonable and necessary in order to provide the required intensity of service to ensure the patient's safety. The patient's presenting symptoms, physical exam findings, and  initial radiographic and laboratory data in the context of their chronic comorbidities is felt to place them at high risk for further clinical deterioration. Furthermore, it is not anticipated that the patient will be medically stable for discharge from the hospital within 2 midnights of admission.  ? ?* I certify that at the point of admission it is my clinical judgment that the patient will require inpatient hospital care spanning beyond 2 midnights from the point of admission due to high intensity of service, high risk for further deterioration and high frequency of surveillance required.* ? ?Author: ?Rhetta Mura, MD ?12/18/2021 3:45 PM ? ?For on call review [email or web address].  ?

## 2021-12-18 NOTE — ED Provider Notes (Signed)
?[facility] ?Provider Note ? ? ?CSN: [id number] ?Arrival date & time: 12/18/21  1144 ? ?  ? ?History ?Chief Complaint  ?Patient presents with  ? Seizures  ? ? ?Lucas Williams is a 46 y.o. male with h/o seizures and alcohol abuse presents to the ED after being found facedown in the motel and possible seizure earlier today.  Patient has known seizure disorder.  Seizure today was unwitnessed.  He presents alert and oriented x3.  Reports some mild nasal pain but is not concerned at this time.  Denies any head pain, blurry vision, chest pain, shortness of breath, abdominal pain, nausea, vomiting, dysuria, hematuria, hematochezia, or any recent illness.  Patient reports he is compliant with his Keppra and his last dose was last night.  Patient is a daily tobacco user.  Occasional alcohol use, last use being 3 to 4 days ago.  He denies any heavy use although the patient was recently seen here 4 days prior for alcohol intoxication.  The patient reports he has seizures every few months.  Denies a history of COPD. ? ?Seizures ? ?  ? ?Home Medications ?Prior to Admission medications   ?Medication Sig Start Date End Date Taking? Authorizing Provider  ?ibuprofen (ADVIL) 600 MG tablet Take 1 tablet (600 mg total) by mouth every 6 (six) hours as needed. ?Patient not taking: Reported on 04/11/2021 05/10/20   Charlann Lange, PA-C  ?levETIRAcetam (KEPPRA) 500 MG tablet Take 1 tablet (500 mg total) by mouth 2 (two) times daily. Please call and make overdue appt for further refills 12/02/21   Veryl Speak, MD  ?Multiple Vitamin (MULTIVITAMIN ADULT) TABS Take 1 tablet by mouth daily.    [provider]  ?   ? ?Allergies    ?Patient has no known allergies.   ? ?Review of Systems   ?Review of Systems  ?Constitutional:  Negative for chills and fever.  ?HENT:  Negative for congestion, rhinorrhea and sore throat.   ?Eyes:  Negative for visual disturbance.  ?Respiratory:  Negative for cough and  shortness of breath.   ?Cardiovascular:  Negative for chest pain.  ?Gastrointestinal:  Negative for abdominal pain, constipation, diarrhea, nausea and vomiting.  ?Genitourinary:  Negative for dysuria and hematuria.  ?Musculoskeletal:  Negative for back pain and neck pain.  ?Neurological:  Positive for seizures. Negative for weakness.  ? ?Physical Exam ?Updated Vital Signs ?BP (!) 174/102 (BP Location: Right Arm)   Pulse (!) 108   Temp 98.6 ?F (37 ?C) (Oral)   Resp (!) 26   Ht 6' (1.829 m)   Wt 102.1 kg   SpO2 97%   BMI 30.52 kg/m?  ?Physical Exam ?Vitals and nursing note reviewed.  ?Constitutional:   ?   Appearance: He is not toxic-appearing.  ?   Comments: Dried blood seen to the right side of the patient's face.  Patient appears disheveled, poor hygiene.  Mildly anxious appearing.  Mildly tremulous.  ?HENT:  ?   Head: Normocephalic and atraumatic.  ?   Comments: No other facial tenderness other than nose.  ?   Right Ear: Tympanic membrane normal.  ?   Left Ear: Tympanic membrane normal.  ?   Nose:  ?   Comments: Coagulated blood seen in the patient's right nare.  Some possible swelling to the patient's nose.  Mildly tender to palpation.  No overlying abrasion. ?   Mouth/Throat:  ?   Mouth: Mucous membranes are moist.  ?   Comments: Poor dentition  ?  Eyes:  ?   General: No scleral icterus. ?   Extraocular Movements: Extraocular movements intact.  ?   Conjunctiva/sclera: Conjunctivae normal.  ?   Pupils: Pupils are equal, round, and reactive to light.  ?Neck:  ?   Comments: C-collar was cleared patient has normal range of motion ?Cardiovascular:  ?   Rate and Rhythm: Regular rhythm. Tachycardia present.  ?Pulmonary:  ?   Effort: Pulmonary effort is normal. No respiratory distress.  ?   Breath sounds: Normal breath sounds.  ?Abdominal:  ?   General: Bowel sounds are normal.  ?   Palpations: Abdomen is soft.  ?   Tenderness: There is no abdominal tenderness. There is no right CVA tenderness, left CVA tenderness,  guarding or rebound.  ?Musculoskeletal:     ?   General: No tenderness or deformity.  ?   Cervical back: Normal range of motion. No tenderness.  ?   Comments: No midline or paraspinal cervical, thoracic, or lumbar tenderness to palpation.  No obvious step-offs or deformities noted.  No overlying erythema or warmth.  No signs of trauma.  ?Skin: ?   General: Skin is warm and dry.  ?Neurological:  ?   General: No focal deficit present.  ?   Mental Status: He is alert and oriented to person, place, and time.  ?   Cranial Nerves: No cranial nerve deficit.  ?   Sensory: No sensory deficit.  ?   Motor: No weakness.  ?   Comments: Patient is alert and oriented x3.  He answers questions appropriately and with appropriate speech.  There is no cranial nerve deficit.  His sensations intact throughout.  He has equal strength in upper and lower bilateral extremities.  No pronator drift.  Patient is mildly tremulous.  ? ? ?ED Results / Procedures / Treatments   ?Labs ?(all labs ordered are listed, but only abnormal results are displayed) ?Labs Reviewed  ?COMPREHENSIVE METABOLIC PANEL - Abnormal; Notable for the following components:  ?    Result Value  ? Glucose, Bld 136 (*)   ? AST 224 (*)   ? ALT 150 (*)   ? All other components within normal limits  ?CBC WITH DIFFERENTIAL/PLATELET  ?ETHANOL  ?MAGNESIUM  ?URINALYSIS, ROUTINE W REFLEX MICROSCOPIC  ?RAPID URINE DRUG SCREEN, HOSP PERFORMED  ?LEVETIRACETAM LEVEL  ? ? ?EKG ?None ? ?Radiology ?DG Chest 2 View ? ?Result Date: 12/18/2021 ?CLINICAL DATA:  Shortness of breath, seizure EXAM: CHEST - 2 VIEW COMPARISON:  04/10/2021 FINDINGS: Cardiac and mediastinal contours are within normal limits given AP technique. No focal pulmonary opacity. No pleural effusion or pneumothorax. No acute osseous abnormality. IMPRESSION: No acute cardiopulmonary process. Electronically Signed   By: Wiliam Ke M.D.   On: 12/18/2021 13:06  ? ?CT Head Wo Contrast ? ?Result Date: 12/18/2021 ?CLINICAL DATA:   Head trauma, moderate-severe; Neck trauma, dangerous injury mechanism (Age 48-64y) EXAM: CT HEAD WITHOUT CONTRAST CT CERVICAL SPINE WITHOUT CONTRAST TECHNIQUE: Multidetector CT imaging of the head and cervical spine was performed following the standard protocol without intravenous contrast. Multiplanar CT image reconstructions of the cervical spine were also generated. RADIATION DOSE REDUCTION: This exam was performed according to the departmental dose-optimization program which includes automated exposure control, adjustment of the mA and/or kV according to patient size and/or use of iterative reconstruction technique. COMPARISON:  Head CT 12/02/2021, cervical spine CT 11/10/2017 FINDINGS: CT HEAD FINDINGS Brain: Unchanged encephalomalacia in the right greater than left frontal lobes, right temporal lobe, and right  parietal lobe. No new loss of gray-white matter differentiation.No acute intracranial hemorrhage.No concerning mass effect.The basal cisterns are patent. Vascular: No hyperdense vessel. Skull: Negative for skull fracture. Sinuses/Orbits: There is mild paranasal sinus mucosal thickening. The orbits are unremarkable. Other: None. CT CERVICAL SPINE FINDINGS Alignment: Mild kyphosis of the upper cervical spine. Skull base and vertebrae: There is no acute cervical spine fracture. No aggressive osseous lesion. Unchanged chronic deformity of the T1 spinous process. Soft tissues and spinal canal: No prevertebral fluid or swelling. No visible canal hematoma. Disc levels: There is mild multilevel degenerative disc disease and facet arthropathy. Upper chest: Negative Other: None. IMPRESSION: No acute intracranial abnormality. 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 No acute cervical spine fracture. Mild multilevel degenerative disc disease and facet arthropathy. Electronically Signed   By: Maurine Simmering M.D.   On: 12/18/2021 14:13  ? ?CT Cervical Spine Wo Contrast ? ?Result Date:  12/18/2021 ?CLINICAL DATA:  Head trauma, moderate-severe; Neck trauma, dangerous injury mechanism (Age 53-64y) EXAM: CT HEAD WITHOUT CONTRAST CT CERVICAL SPINE WITHOUT CONTRAST TECHNIQUE: Multidetector CT imaging of

## 2021-12-18 NOTE — ED Notes (Signed)
Patient transported to CT 

## 2021-12-18 NOTE — Plan of Care (Signed)
°  Problem: Education: °Goal: Knowledge of General Education information will improve °Description: Including pain rating scale, medication(s)/side effects and non-pharmacologic comfort measures °Outcome: Not Progressing °  °Problem: Health Behavior/Discharge Planning: °Goal: Ability to manage health-related needs will improve °Outcome: Not Progressing °  °Problem: Activity: °Goal: Risk for activity intolerance will decrease °Outcome: Not Progressing °  °

## 2021-12-18 NOTE — ED Triage Notes (Signed)
Pt BIB EMS due to a seizure. Pt is homeless and had an unwitnessed seizure. Motel said they found pt face pt. Pt is post ictal with EMS and arrival. Pt is tachy upon arrival.  ?

## 2021-12-18 NOTE — Hospital Course (Addendum)
? ?  Potassium 3.0-K-Dur 40 twice daily ?CIWA ranging from 0-4 so far ?Add on magnesium ?

## 2021-12-19 LAB — CBC WITH DIFFERENTIAL/PLATELET
Abs Immature Granulocytes: 0.02 10*3/uL (ref 0.00–0.07)
Basophils Absolute: 0 10*3/uL (ref 0.0–0.1)
Basophils Relative: 1 %
Eosinophils Absolute: 0.1 10*3/uL (ref 0.0–0.5)
Eosinophils Relative: 2 %
HCT: 39.4 % (ref 39.0–52.0)
Hemoglobin: 13.8 g/dL (ref 13.0–17.0)
Immature Granulocytes: 0 %
Lymphocytes Relative: 22 %
Lymphs Abs: 1.4 10*3/uL (ref 0.7–4.0)
MCH: 31.8 pg (ref 26.0–34.0)
MCHC: 35 g/dL (ref 30.0–36.0)
MCV: 90.8 fL (ref 80.0–100.0)
Monocytes Absolute: 0.7 10*3/uL (ref 0.1–1.0)
Monocytes Relative: 11 %
Neutro Abs: 4 10*3/uL (ref 1.7–7.7)
Neutrophils Relative %: 64 %
Platelets: 159 10*3/uL (ref 150–400)
RBC: 4.34 MIL/uL (ref 4.22–5.81)
RDW: 13.2 % (ref 11.5–15.5)
WBC: 6.3 10*3/uL (ref 4.0–10.5)
nRBC: 0 % (ref 0.0–0.2)

## 2021-12-19 LAB — URINALYSIS, ROUTINE W REFLEX MICROSCOPIC
Bilirubin Urine: NEGATIVE
Glucose, UA: NEGATIVE mg/dL
Hgb urine dipstick: NEGATIVE
Ketones, ur: 5 mg/dL — AB
Leukocytes,Ua: NEGATIVE
Nitrite: NEGATIVE
Protein, ur: NEGATIVE mg/dL
Specific Gravity, Urine: 1.014 (ref 1.005–1.030)
pH: 7 (ref 5.0–8.0)

## 2021-12-19 LAB — COMPREHENSIVE METABOLIC PANEL
ALT: 128 U/L — ABNORMAL HIGH (ref 0–44)
AST: 174 U/L — ABNORMAL HIGH (ref 15–41)
Albumin: 3.4 g/dL — ABNORMAL LOW (ref 3.5–5.0)
Alkaline Phosphatase: 72 U/L (ref 38–126)
Anion gap: 7 (ref 5–15)
BUN: 9 mg/dL (ref 6–20)
CO2: 26 mmol/L (ref 22–32)
Calcium: 8.5 mg/dL — ABNORMAL LOW (ref 8.9–10.3)
Chloride: 104 mmol/L (ref 98–111)
Creatinine, Ser: 0.83 mg/dL (ref 0.61–1.24)
GFR, Estimated: >60 mL/min (ref >60)
Glucose, Bld: 85 mg/dL (ref 70–99)
Potassium: 3 mmol/L — ABNORMAL LOW (ref 3.5–5.1)
Sodium: 137 mmol/L (ref 135–145)
Total Bilirubin: 1.5 mg/dL — ABNORMAL HIGH (ref 0.3–1.2)
Total Protein: 6.8 g/dL (ref 6.5–8.1)

## 2021-12-19 LAB — RAPID URINE DRUG SCREEN, HOSP PERFORMED
Amphetamines: NOT DETECTED
Barbiturates: NOT DETECTED
Benzodiazepines: POSITIVE — AB
Cocaine: NOT DETECTED
Opiates: NOT DETECTED
Tetrahydrocannabinol: NOT DETECTED

## 2021-12-19 LAB — MAGNESIUM: Magnesium: 2.1 mg/dL (ref 1.7–2.4)

## 2021-12-19 MED ORDER — POTASSIUM CHLORIDE CRYS ER 20 MEQ PO TBCR
40.0000 meq | EXTENDED_RELEASE_TABLET | Freq: Two times a day (BID) | ORAL | Status: DC
  Administered 2021-12-19 – 2021-12-20 (×3): 40 meq via ORAL
  Filled 2021-12-19 (×3): qty 2

## 2021-12-19 MED ORDER — LOPERAMIDE HCL 2 MG PO CAPS
2.0000 mg | ORAL_CAPSULE | Freq: Once | ORAL | Status: AC
  Administered 2021-12-19: 2 mg via ORAL
  Filled 2021-12-19: qty 1

## 2021-12-19 NOTE — Progress Notes (Signed)
?PROGRESS NOTE ? ? ?Lucas Williams  [id number] DOB: 27-Nov-1975 DOA: 12/18/2021 ?PCP: Patient, No Pcp Per (Inactive)  ?Brief Narrative:  ? 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  ?Initially presented to emergency room 3/27 with alcohol intoxication did not have a ride-ambulated effectively out of the ED on 3/27 blood alcohol level was 495 ?  ?Patient came in [facility] secondary to seizure--- patient states he does not typically drink much and is a little bit evasive tells me he drinks about 2-3 beers a day ? ?Hospital-Problem based course ? 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 ?BMI is 30 ?Patient is a heavy drinker with binge drinking-needs to cut down on the same-no PCP will set up with [facility] for preventive care ? ?DVT prophylaxis: lovenox ?Code Status: full ?Family Communication: none ?Disposition:  ?Status is: Inpatient ?Remains inpatient appropriate because:  ? ?Requires continued monitoring ?  ?Consultants:  ?Needs continued  ? ?Procedures:  ? ?Antimicrobials:   ? ? ?Subjective: ? ?Well no distress.no withdrawal s/sym---No cp no fever no chills no nausea no vomiting-he is ambulated around the unit several times ? ?Objective: ?Vitals:  ? 12/19/21 0415 12/19/21 0736 12/19/21 1100 12/19/21 1107  ?BP: (!) 142/85 (!) 141/79 (!) 159/94 (!) 152/94  ?Pulse: 82 87 (!) 112 (!) 112  ?Resp: 17 15 20 20   ?Temp: 98.2 ?F (36.8 ?C) 98.8 ?F (37.1 ?C)  98.8 ?F (37.1 ?C)  ?TempSrc: Oral  Oral  Oral  ?SpO2: 93% 94%  94%  ?Weight:      ?Height:      ? ? ?Intake/Output Summary (Last 24 hours) at 12/19/2021 1522 ?Last data filed at 12/19/2021 0000 ?Gross per 24 hour  ?Intake 700 ml  ?Output 1000 ml  ?Net -300 ml  ? ?Filed Weights  ? 12/18/21 1151  ?Weight: 102.1 kg  ? ? ?Examination: ? ?Eomi ncat pallor ?Patient has some scarring to the nose with recommending of the eyes secondary to the fall ?He has no pain ?Chest is clear no rales no rhonchi ?No wheeze ?Abdomen obese liver felt cannot feel spleen ?No lower extremity edema ?No tremor power 5/5 ? ?Data Reviewed: personally reviewed  ? ?CBC ?   ?Component Value Date/Time  ? WBC 6.3 12/19/2021 0112  ? RBC 4.34 12/19/2021 0112  ? HGB 13.8 12/19/2021 0112  ? HCT 39.4 12/19/2021 0112  ? PLT 159 12/19/2021 0112  ? MCV 90.8 12/19/2021 0112  ? MCH 31.8 12/19/2021 0112  ? MCHC 35.0 12/19/2021 0112  ? RDW 13.2 12/19/2021 0112  ? LYMPHSABS 1.4 12/19/2021 0112  ? MONOABS 0.7 12/19/2021 0112  ? EOSABS 0.1 12/19/2021 0112  ? BASOSABS 0.0 12/19/2021 0112  ? ? ?  Latest Ref Rng & Units 12/19/2021  ?  1:12 AM 12/18/2021  ? 12:05 PM 12/14/2021  ?  4:40 PM  ?CMP  ?Glucose 70 - 99 mg/dL 85   12/16/2021   644    ?BUN 6 - 20 mg/dL 9   12   10     ?Creatinine 0.61 - 1.24 mg/dL 034  1.04   0.90    ?Sodium 135 - 145 mmol/L 137   136   141    ?Potassium 3.5 - 5.1 mmol/L 3.0   4.0   3.9    ?Chloride 98 - 111 mmol/L 104   101   105    ?CO2 22 - 32 mmol/L 26   22   26     ?Calcium 8.9 - 10.3 mg/dL 8.5   9.0   8.4    ?Total Protein 6.5 - 8.1 g/dL 6.8   7.3   7.7    ?Total Bilirubin 0.3 - 1.2 mg/dL 1.5   1.0   0.7    ?Alkaline Phos 38 - 126 U/L 72   92   83    ?AST 15 - 41 U/L 174   224   93    ?ALT 0 - 44 U/L 128   150   122    ? ? ? ?Radiology Studies: ?DG Chest 2 View ? ?Result Date: 12/18/2021 ?CLINICAL DATA:  Shortness of breath, seizure EXAM: CHEST - 2 VIEW COMPARISON:  04/10/2021 FINDINGS: Cardiac and mediastinal contours are within normal limits given AP technique. No focal pulmonary  opacity. No pleural effusion or pneumothorax. No acute osseous abnormality. IMPRESSION: No acute cardiopulmonary process. Electronically Signed   By: 04/12/2021 M.D.   On: 12/18/2021 13:06  ? ?CT Head Wo Contrast ? ?Result Date: 12/18/2021 ?CLINICAL DATA:  Head trauma, moderate-severe; Neck trauma, dangerous injury mechanism (Age 47-64y) EXAM: CT HEAD WITHOUT CONTRAST CT CERVICAL SPINE WITHOUT CONTRAST TECHNIQUE: Multidetector CT imaging of the head and cervical spine was performed following the standard protocol without intravenous contrast. Multiplanar CT image reconstructions of the cervical spine were also generated. RADIATION DOSE REDUCTION: This exam was performed according to the departmental dose-optimization program which includes automated exposure control, adjustment of the mA and/or kV according to patient size and/or use of iterative reconstruction technique. COMPARISON:  Head CT 12/02/2021, cervical spine CT 11/10/2017 FINDINGS: CT HEAD FINDINGS Brain: Unchanged encephalomalacia in the right greater than left frontal lobes, right temporal lobe, and right parietal lobe. No new loss of gray-white matter differentiation.No acute intracranial hemorrhage.No concerning mass effect.The basal cisterns are patent. Vascular: No hyperdense vessel. Skull: Negative for skull fracture. Sinuses/Orbits: There is mild paranasal sinus mucosal thickening. The orbits are unremarkable. Other: None. CT CERVICAL SPINE FINDINGS Alignment: Mild kyphosis of the upper cervical spine. Skull base and vertebrae: There is no acute cervical spine fracture. No aggressive osseous lesion. Unchanged chronic deformity of the T1 spinous process. Soft tissues and spinal canal: No prevertebral fluid or swelling. No visible canal hematoma. Disc levels: There is mild multilevel degenerative disc disease and facet arthropathy. Upper chest: Negative Other: None. IMPRESSION: No acute intracranial abnormality. Unchanged multifocal  encephalomalacia bilaterally from prior infarcts. No acute cervical spine fracture. Mild multilevel degenerative disc disease and facet arthropathy. Electronically Signed   By: 11/12/2017 M.D.   On: 12/18/2021 14:13  ? ?CT Cervical Spine Wo Contrast ? ?Result Date: 12/18/2021 ?CLINICAL DATA:  Head trauma, moderate-severe; Neck trauma, dangerous injury mechanism (Age 42-64y) EXAM: CT HEAD WITHOUT CONTRAST CT CERVICAL SPINE WITHOUT CONTRAST TECHNIQUE: Multidetector CT imaging of the head and cervical spine was performed following the standard protocol without intravenous contrast. Multiplanar CT image reconstructions of the cervical spine were also generated. RADIATION DOSE REDUCTION: This exam was performed according to the departmental dose-optimization program which includes automated exposure control, adjustment of the mA and/or kV according to patient size and/or  use of iterative reconstruction technique. COMPARISON:  Head CT 12/02/2021, cervical spine CT 11/10/2017 FINDINGS: CT HEAD FINDINGS Brain: Unchanged encephalomalacia in the right greater than left frontal lobes, right temporal lobe, and right parietal lobe. No new loss of gray-white matter differentiation.No acute intracranial hemorrhage.No concerning mass effect.The basal cisterns are patent. Vascular: No hyperdense vessel. Skull: Negative for skull fracture. Sinuses/Orbits: There is mild paranasal sinus mucosal thickening. The orbits are unremarkable. Other: None. CT CERVICAL SPINE FINDINGS Alignment: Mild kyphosis of the upper cervical spine. Skull base and vertebrae: There is no acute cervical spine fracture. No aggressive osseous lesion. Unchanged chronic deformity of the T1 spinous process. Soft tissues and spinal canal: No prevertebral fluid or swelling. No visible canal hematoma. Disc levels: There is mild multilevel degenerative disc disease and facet arthropathy. Upper chest: Negative Other: None. IMPRESSION: No acute intracranial abnormality.  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 No acute cervical spine fracture. Mild multilevel degenerative disc disease and facet arthropathy. Electronically Signed   By: Caprice RenshawJacob  Kahn M.D.   On: 03/

## 2021-12-19 NOTE — Progress Notes (Signed)
Mobility Specialist Progress Note  ? ? 12/19/21 1359  ?Mobility  ?Activity Ambulated independently in hallway  ?Level of Assistance Independent  ?Assistive Device None  ?Distance Ambulated (ft) 800 ft  ?Activity Response Tolerated well  ?$Mobility charge 1 Mobility  ? ?Pt received sitting EOB and agreeable. No complaints on walk. Returned to bed with call bell in reach.   ? ?[place] ?Mobility Specialist  ?  ?

## 2021-12-19 NOTE — Plan of Care (Signed)

## 2021-12-20 ENCOUNTER — Encounter: Payer: Self-pay | Admitting: Family Medicine

## 2021-12-20 LAB — COMPREHENSIVE METABOLIC PANEL
ALT: 117 U/L — ABNORMAL HIGH (ref 0–44)
AST: 124 U/L — ABNORMAL HIGH (ref 15–41)
Albumin: 3.5 g/dL (ref 3.5–5.0)
Alkaline Phosphatase: 69 U/L (ref 38–126)
Anion gap: 8 (ref 5–15)
BUN: 9 mg/dL (ref 6–20)
CO2: 23 mmol/L (ref 22–32)
Calcium: 8.9 mg/dL (ref 8.9–10.3)
Chloride: 105 mmol/L (ref 98–111)
Creatinine, Ser: 0.8 mg/dL (ref 0.61–1.24)
GFR, Estimated: >60 mL/min (ref >60)
Glucose, Bld: 91 mg/dL (ref 70–99)
Potassium: 3.7 mmol/L (ref 3.5–5.1)
Sodium: 136 mmol/L (ref 135–145)
Total Bilirubin: 1.1 mg/dL (ref 0.3–1.2)
Total Protein: 7 g/dL (ref 6.5–8.1)

## 2021-12-20 MED ORDER — NICOTINE 7 MG/24HR TD PT24: 7.0000 mg | MEDICATED_PATCH | Freq: Every day | TRANSDERMAL | 0 refills | Status: AC

## 2021-12-20 MED ORDER — LEVETIRACETAM 500 MG PO TABS: 500.0000 mg | ORAL_TABLET | Freq: Two times a day (BID) | ORAL | 2 refills | Status: DC

## 2021-12-20 NOTE — Plan of Care (Signed)
  Problem: Education: Goal: Knowledge of General Education information will improve Description: Including pain rating scale, medication(s)/side effects and non-pharmacologic comfort measures Outcome: Adequate for Discharge   Problem: Health Behavior/Discharge Planning: Goal: Ability to manage health-related needs will improve Outcome: Adequate for Discharge   Problem: Clinical Measurements: Goal: Ability to maintain clinical measurements within normal limits will improve Outcome: Adequate for Discharge Goal: Will remain free from infection Outcome: Adequate for Discharge Goal: Diagnostic test results will improve Outcome: Adequate for Discharge Goal: Respiratory complications will improve Outcome: Adequate for Discharge Goal: Cardiovascular complication will be avoided Outcome: Adequate for Discharge   Problem: Activity: Goal: Risk for activity intolerance will decrease Outcome: Adequate for Discharge   Problem: Nutrition: Goal: Adequate nutrition will be maintained Outcome: Adequate for Discharge   Problem: Elimination: Goal: Will not experience complications related to bowel motility Outcome: Adequate for Discharge Goal: Will not experience complications related to urinary retention Outcome: Adequate for Discharge   Problem: Pain Managment: Goal: General experience of comfort will improve Outcome: Adequate for Discharge   Problem: Safety: Goal: Ability to remain free from injury will improve Outcome: Adequate for Discharge   Problem: Skin Integrity: Goal: Risk for impaired skin integrity will decrease Outcome: Adequate for Discharge   

## 2021-12-20 NOTE — Progress Notes (Unsigned)
{  Select_TRH_Note:[id number]} 

## 2021-12-20 NOTE — Discharge Instructions (Addendum)
Follow up with Dr. Lucia Gaskins ? ?Please call [facility] at [phone] to get established with a Primary Care Provider. ?

## 2021-12-20 NOTE — Progress Notes (Signed)
Patient discharge instructions reviewed with patient, verbalized understanding. Written prescription given along with written DC instructions and note for patient employer. Via wheelchair to the front door in stable condition ?

## 2021-12-20 NOTE — Discharge Summary (Signed)
?Physician Discharge Summary ?  ?Patient: Lucas Williams MRN: [id number] DOB: December 26, 1975  ?Admit date:     12/18/2021  ?Discharge date: 12/20/21  ?Discharge Physician: Nita Sells  ? ?PCP: Patient, No Pcp Per (Inactive)  ? ?Recommendations at discharge:  ?Dr. Verlon Au here someone called me ?Patient should follow-up with primary care physician/get TOC to help ?Cessation counseling given to patient-unclear if he will be agreeable ?Called in his Keppra to his pharmacy-he sees Dr. Lavell Anchors chronically and I will CC her on this note-patient has not been seen by her in over 2 years and was supposed to get an MRI at some point because of TBI and encephalomalacia-hopefully he can coordinate with her in the outpatient setting ? ? ?Discharge Diagnoses: ?Principal Problem: ?  Alcohol withdrawal ([place]) ? ?Resolved Problems: ?  * No resolved hospital problems. * ? 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  ?Initially presented to emergency room 3/27 with alcohol intoxication did not have a ride-ambulated effectively out of the ED on 3/27 blood alcohol level was 495 ?  ?Patient came in [name] secondary to seizure--- patient states he does not typically drink much and is a little bit evasive tells me he drinks about 2-3 beers a day ?  ?Hospital-Problem based course ?  ?Alcohol withdrawal vs other etiology Sz ?Monitor for 24 hours more--Seizure characterisitcs were unusual for patient--usually has a prodrome ?Continue regular Keppra 500 bid dose-it is doubtful that patient was compliant on his Keppra-he tells me he has been taking it but ED notes report that he had not had them filled in a while ?I refilled Keppra twice daily for 180 days and call them into his pharmacy-I have told him explicitly that he has  to follow-up with Dr. Lavell Anchors of neurology ?He should not drive with seizures per [facility] statutes and should be aware of this with his history of this ?Alcoholic hepatitis ?Binge drinking in chr ETOH abuse ?Lft going down and should trend down if he continues to desist from alcohol ?Monitor trends ?Unclear if will quit-he is precontemplative ?Hypokalemia ?Replace with Kdur 20, mag ok-Labs resolved prior to discharge ?Impaired glucose tolerance ?Metabolic syndrome X? ?BMI is 30 ?Patient is a heavy drinker with binge drinking-needs to cut down on the same-no PCP will set up with TOC for preventive care ?  ? ? ?  ? ? ?Consultants:  ?Procedures performed:   ?Disposition: Motel ?Diet recommendation:  ?Discharge Diet Orders (From admission, onward)  ? ?  Start     Ordered  ? 12/20/21 0000  Diet - low sodium heart healthy       ? 12/20/21 0738  ? ?  ?  ? ?  ? ?Regular diet ?DISCHARGE MEDICATION: ?Allergies as of 12/20/2021   ?No Known Allergies ?  ? ?  ?Medication List  ?  ? ?TAKE these medications   ? ?ibuprofen 600 MG tablet ?Commonly known as: ADVIL ?Take 1 tablet (600 mg total) by mouth every 6 (six) hours as needed. ?  ?levETIRAcetam 500 MG tablet ?Commonly known as: Keppra ?Take 1 tablet (500 mg total) by mouth 2 (two) times daily. Please call and make overdue appt for further refills ?  ?Multivitamin Adult Tabs ?Take 1 tablet by mouth daily. ?  ?nicotine 7 mg/24hr patch ?Commonly known as: NICODERM CQ - dosed in mg/24  hr ?Place 1 patch (7 mg total) onto the skin daily. ?  ? ?  ? ? ?Discharge Exam: ?Ceasar MonsFiled Weights  ? 12/18/21 1151  ?Weight: 102.1 kg  ? ?Awake coherent no distress minimal withdrawal symptoms not shaky ?Chest clear no rales rhonchi ?ROM intact ?Abdomen soft no rebound no guarding ?Neurologically intact no focal deficit ? ?Condition at discharge: fair ? ?The results of significant diagnostics from this hospitalization (including imaging, microbiology, ancillary and laboratory) are listed below for  reference.  ? ?Imaging Studies: ?DG Chest 2 View ? ?Result Date: 12/18/2021 ?CLINICAL DATA:  Shortness of breath, seizure EXAM: CHEST - 2 VIEW COMPARISON:  04/10/2021 FINDINGS: Cardiac and mediastinal contours are within normal limits given AP technique. No focal pulmonary opacity. No pleural effusion or pneumothorax. No acute osseous abnormality. IMPRESSION: No acute cardiopulmonary process. Electronically Signed   By: Wiliam KeAlison  Vasan M.D.   On: 12/18/2021 13:06  ? ?CT Head Wo Contrast ? ?Result Date: 12/18/2021 ?CLINICAL DATA:  Head trauma, moderate-severe; Neck trauma, dangerous injury mechanism (Age 46-64y) EXAM: CT HEAD WITHOUT CONTRAST CT CERVICAL SPINE WITHOUT CONTRAST TECHNIQUE: Multidetector CT imaging of the head and cervical spine was performed following the standard protocol without intravenous contrast. Multiplanar CT image reconstructions of the cervical spine were also generated. RADIATION DOSE REDUCTION: This exam was performed according to the departmental dose-optimization program which includes automated exposure control, adjustment of the mA and/or kV according to patient size and/or use of iterative reconstruction technique. COMPARISON:  Head CT 12/02/2021, cervical spine CT 11/10/2017 FINDINGS: CT HEAD FINDINGS Brain: Unchanged encephalomalacia in the right greater than left frontal lobes, right temporal lobe, and right parietal lobe. No new loss of gray-white matter differentiation.No acute intracranial hemorrhage.No concerning mass effect.The basal cisterns are patent. Vascular: No hyperdense vessel. Skull: Negative for skull fracture. Sinuses/Orbits: There is mild paranasal sinus mucosal thickening. The orbits are unremarkable. Other: None. CT CERVICAL SPINE FINDINGS Alignment: Mild kyphosis of the upper cervical spine. Skull base and vertebrae: There is no acute cervical spine fracture. No aggressive osseous lesion. Unchanged chronic deformity of the T1 spinous process. Soft tissues and spinal  canal: No prevertebral fluid or swelling. No visible canal hematoma. Disc levels: There is mild multilevel degenerative disc disease and facet arthropathy. Upper chest: Negative Other: None. IMPRESSION: No acute intracranial abnormality. 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 No acute cervical spine fracture. Mild multilevel degenerative disc disease and facet arthropathy. Electronically Signed   By: Caprice RenshawJacob  Kahn M.D.   On: 12/18/2021 14:13  ? ?CT Head Wo Contrast ? ?Result Date: 12/02/2021 ?CLINICAL DATA:  Transient ischemic attack EXAM: CT HEAD WITHOUT CONTRAST TECHNIQUE: Contiguous axial images were obtained from the base of the skull through the vertex without intravenous contrast. RADIATION DOSE REDUCTION: This exam was performed according to the departmental dose-optimization program which includes automated exposure control, adjustment of the mA and/or kV according to patient size and/or use of iterative reconstruction technique. COMPARISON:  04/11/2021 FINDINGS: Brain: Focal areas of encephalomalacia in the bilateral frontal lobes, right anterior temporal lobe, and right parietal lobes consistent with old infarcts. No change since prior study. No mass-effect or midline shift. No abnormal extra-axial fluid collections. Basal cisterns are not effaced. Ventricular dilatation is consistent with central atrophy. No acute intracranial hemorrhage. Vascular: Mild intracranial vascular calcifications. Skull: Normal. Negative for fracture or focal lesion. Sinuses/Orbits: No acute finding. Other: None. IMPRESSION: No acute intracranial abnormalities. 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 Mild cortical atrophy. Electronically Signed  By: Lucienne Capers M.D.   On: 12/02/2021 02:33  ? ?CT Cervical Spine Wo Contrast ? ?Result Date: 12/18/2021 ?CLINICAL DATA:  Head trauma, moderate-severe; Neck trauma, dangerous injury mechanism (Age 13-64y) EXAM: CT HEAD WITHOUT  CONTRAST CT CERVICAL SPINE WITHOUT CONTRAST TECHNIQUE: Multidetector CT imaging of the head and cervical spine was performed following the standard protocol without intravenous contrast. Multiplanar CT

## 2021-12-20 NOTE — TOC Initial Note (Signed)
Transition of Care (TOC) - Initial/Assessment Note  ? ? ?Patient Details  ?Name: Lucas Williams ?MRN: [id number] ?Date of Birth: 1976-08-07 ? ?Transition of Care (TOC) CM/SW Contact:    ?Jonita Hirota G., RN ?Phone Number: ?12/20/2021, 9:14 AM ? ?Clinical Narrative:      ?46 year old white male known history of chronic ethanolism, prior seizures supposed to be on Keppra, chronic LFT elevation, smoker, ?RNCM spoke with patient.  Patient states he will be able to get his [facility] at [facility].  He states he will call Dr. Trevor Mace office on Monday to schedule an appointment as office is closed today.  Patient given phone number for [facility] on discharge summary to call and schedule an appointment with a PCP.  Patient states he will be getting his insurance soon.   ?            ?Expected Discharge Plan: Home/Self Care ?Barriers to Discharge: No Barriers Identified ? ?Patient Goals and CMS Choice ?Patient states their goals for this hospitalization and ongoing recovery are:: to be able to return home ?  ?  ?Expected Discharge Plan and Services ?Expected Discharge Plan: Home/Self Care ?In-house Referral: PCP / Health Connect ?Discharge Planning Services: CM Consult ?Post Acute Care Choice: NA ?Living arrangements for the past 2 months: Single Family Home ?Expected Discharge Date: 12/20/21               ?DME Arranged: N/A ?DME Agency: NA ?  ?  ?  ?HH Arranged: NA ?HH Agency: NA ?  ?  ?  ? ?Prior Living Arrangements/Services ?Living arrangements for the past 2 months: Single Family Home ?Lives with:: Self ?Patient language and need for interpreter reviewed:: Yes ?       ?Need for Family Participation in Patient Care: No (Comment) ?Care giver support system in place?: Yes (comment) ?  ?Criminal Activity/Legal Involvement Pertinent to Current Situation/Hospitalization: No - Comment as needed ? ?Activities of Daily Living ?  ?  ? ?Permission Sought/Granted ?  ?  ?   ?   ?   ?   ? ?Emotional Assessment ?Appearance:: Appears  stated age ?Attitude/Demeanor/Rapport: Gracious ?Affect (typically observed): Accepting ?  ?Alcohol / Substance Use: Alcohol Use ?Psych Involvement: No (comment) ? ?Admission diagnosis:  Alcohol withdrawal (HCC) [F10.939] ?Patient Active Problem List  ? Diagnosis Date Noted  ? Alcohol withdrawal (HCC) 12/18/2021  ? COPD exacerbation (HCC)   ? Pneumonia of right lung due to infectious organism   ? COPD with acute exacerbation (HCC) 04/11/2021  ? Seizure (HCC) 04/10/2021  ? Aspiration into airway 04/10/2021  ? Transaminitis 04/10/2021  ? Acute respiratory failure with hypoxia (HCC) 04/10/2021  ? 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 Alcohol abuse 02/11/2017  ? Adjustment reaction with anxiety and depression 02/11/2017  ? Pulled muscle 02/11/2017  ? TBI (traumatic brain injury) (HCC) 01/29/2017  ? ?PCP:  Patient, No Pcp Per (Inactive) ?Pharmacy:   ?[facility] [address], [place] - [address] ?[phone] ?[place] [address] ?Phone: [phone] Fax: [phone] ? ? ?Social Determinants of Health (SDOH) Interventions ?  ? ?Readmission Risk Interventions ?   ? View : No data to display.  ?  ?  ?  ? ?

## 2021-12-21 ENCOUNTER — Emergency Department (HOSPITAL_COMMUNITY)
Admission: EM | Admit: 2021-12-21 | Discharge: 2021-12-21 | Disposition: A | Discharge status: Home / Self Care | Payer: 59 | Attending: Emergency Medicine | Admitting: Emergency Medicine

## 2021-12-21 ENCOUNTER — Other Ambulatory Visit: Payer: Self-pay

## 2021-12-21 ENCOUNTER — Encounter (HOSPITAL_COMMUNITY): Payer: Self-pay

## 2021-12-21 DIAGNOSIS — X58XXXA Exposure to other specified factors, initial encounter: Secondary | ICD-10-CM | POA: Diagnosis not present

## 2021-12-21 DIAGNOSIS — Y990 Civilian activity done for income or pay: Secondary | ICD-10-CM | POA: Insufficient documentation

## 2021-12-21 DIAGNOSIS — W1839XA Other fall on same level, initial encounter: Secondary | ICD-10-CM | POA: Insufficient documentation

## 2021-12-21 DIAGNOSIS — T40601A Poisoning by unspecified narcotics, accidental (unintentional), initial encounter: Secondary | ICD-10-CM | POA: Diagnosis present

## 2021-12-21 DIAGNOSIS — S0011XA Contusion of right eyelid and periocular area, initial encounter: Secondary | ICD-10-CM | POA: Insufficient documentation

## 2021-12-21 DIAGNOSIS — Z0289 Encounter for other administrative examinations: Secondary | ICD-10-CM

## 2021-12-21 DIAGNOSIS — S0012XA Contusion of left eyelid and periocular area, initial encounter: Secondary | ICD-10-CM | POA: Diagnosis not present

## 2021-12-21 DIAGNOSIS — S0511XA Contusion of eyeball and orbital tissues, right eye, initial encounter: Secondary | ICD-10-CM | POA: Insufficient documentation

## 2021-12-21 DIAGNOSIS — Z042 Encounter for examination and observation following work accident: Secondary | ICD-10-CM | POA: Insufficient documentation

## 2021-12-21 DIAGNOSIS — S0512XA Contusion of eyeball and orbital tissues, left eye, initial encounter: Secondary | ICD-10-CM | POA: Insufficient documentation

## 2021-12-21 LAB — COMPREHENSIVE METABOLIC PANEL
ALT: 130 U/L — ABNORMAL HIGH (ref 0–44)
AST: 117 U/L — ABNORMAL HIGH (ref 15–41)
Albumin: 4.3 g/dL (ref 3.5–5.0)
Alkaline Phosphatase: 84 U/L (ref 38–126)
Anion gap: 11 (ref 5–15)
BUN: 12 mg/dL (ref 6–20)
CO2: 20 mmol/L — ABNORMAL LOW (ref 22–32)
Calcium: 8.8 mg/dL — ABNORMAL LOW (ref 8.9–10.3)
Chloride: 107 mmol/L (ref 98–111)
Creatinine, Ser: 0.98 mg/dL (ref 0.61–1.24)
GFR, Estimated: >60 mL/min (ref >60)
Glucose, Bld: 136 mg/dL — ABNORMAL HIGH (ref 70–99)
Potassium: 3.9 mmol/L (ref 3.5–5.1)
Sodium: 138 mmol/L (ref 135–145)
Total Bilirubin: 0.4 mg/dL (ref 0.3–1.2)
Total Protein: 8.4 g/dL — ABNORMAL HIGH (ref 6.5–8.1)

## 2021-12-21 LAB — CBC WITH DIFFERENTIAL/PLATELET
Abs Immature Granulocytes: 0.07 10*3/uL (ref 0.00–0.07)
Basophils Absolute: 0 10*3/uL (ref 0.0–0.1)
Basophils Relative: 0 %
Eosinophils Absolute: 0.1 10*3/uL (ref 0.0–0.5)
Eosinophils Relative: 2 %
HCT: 42.9 % (ref 39.0–52.0)
Hemoglobin: 14.6 g/dL (ref 13.0–17.0)
Immature Granulocytes: 1 %
Lymphocytes Relative: 20 %
Lymphs Abs: 1.9 10*3/uL (ref 0.7–4.0)
MCH: 31.8 pg (ref 26.0–34.0)
MCHC: 34 g/dL (ref 30.0–36.0)
MCV: 93.5 fL (ref 80.0–100.0)
Monocytes Absolute: 0.8 10*3/uL (ref 0.1–1.0)
Monocytes Relative: 8 %
Neutro Abs: 6.6 10*3/uL (ref 1.7–7.7)
Neutrophils Relative %: 69 %
Platelets: 174 10*3/uL (ref 150–400)
RBC: 4.59 MIL/uL (ref 4.22–5.81)
RDW: 13.4 % (ref 11.5–15.5)
WBC: 9.5 10*3/uL (ref 4.0–10.5)
nRBC: 0 % (ref 0.0–0.2)

## 2021-12-21 LAB — RAPID URINE DRUG SCREEN, HOSP PERFORMED
Amphetamines: NOT DETECTED
Barbiturates: NOT DETECTED
Benzodiazepines: POSITIVE — AB
Cocaine: NOT DETECTED
Opiates: NOT DETECTED
Tetrahydrocannabinol: NOT DETECTED

## 2021-12-21 LAB — ETHANOL: Alcohol, Ethyl (B): 190 mg/dL — ABNORMAL HIGH (ref <10)

## 2021-12-21 LAB — ACETAMINOPHEN LEVEL: Acetaminophen (Tylenol), Serum: <10 ug/mL — ABNORMAL LOW (ref 10–30)

## 2021-12-21 LAB — LEVETIRACETAM LEVEL: Levetiracetam Lvl: <2 ug/mL — ABNORMAL LOW (ref 10.0–40.0)

## 2021-12-21 MED ORDER — NALOXONE HCL 4 MG/0.1ML NA LIQD
1.0000 | Freq: Once | NASAL | Status: AC
  Administered 2021-12-21: 1 via NASAL
  Filled 2021-12-21: qty 4

## 2021-12-21 NOTE — Discharge Instructions (Signed)
There is help if you need it.  Please do not use dirty needles, this could cause you a severe infection to your skin, heart or spinal cord.  This could kill you or leave you permanently disabled.  There was a recent study done at [facility] that showed that the risk of death for someone that had unintentionally overdosed on narcotics was as high as 15% in the next year.  This is much higher than most every other medical condition.  [place] Solution to the Opioid Problem (GCSTOP) Fixed; mobile; peer-based Chase Holleman [phone] [email or web address] Fixed site exchange at [employer], Wednesdays (2-5pm) and Thursdays (4-8pm). [address] [place],  [address] Call or text to arrange mobile and peer exchange, Mondays (1-4pm) and Fridays (4-7pm). Serving [place] [email or web address]  Suboxone clinic: [facility] [address] [place], [address]  [facility] [address] [place], [address]  [facility] [address] [place] [address]  

## 2021-12-21 NOTE — ED Provider Notes (Signed)
?[facility]-EMERGENCY DEPT ?Provider Note ? ? ?CSN: [id number] ?Arrival date & time: 12/21/21  1929 ? ?  ? ?History ? ?Chief Complaint  ?Patient presents with  ? Drug Overdose  ? ? ?Trino Higinbotham is a 46 y.o. male. ? ?46 yo M with a chief complaints of possible opiate overdose.  Patient was found unconscious in some bushes by [employer] donuts and was given Narcan with improvement.  The patient adamantly denies doing any drugs whatsoever.  He does tell me that he drinks quite a bit and was drinking earlier today.  He had a head injury earlier in the week and had had a seizure.  He has had no issues with that since then but does have black eyes from that event. ? ? ?Drug Overdose ? ? ?  ? ?Home Medications ?Prior to Admission medications   ?Medication Sig Start Date End Date Taking? Authorizing Provider  ?ibuprofen (ADVIL) 600 MG tablet Take 1 tablet (600 mg total) by mouth every 6 (six) hours as needed. 05/10/20   Elpidio Anis, PA-C  ?levETIRAcetam (KEPPRA) 500 MG tablet Take 1 tablet (500 mg total) by mouth 2 (two) times daily. Please call and make overdue appt for further refills 12/20/21   Rhetta Mura, MD  ?Multiple Vitamin (MULTIVITAMIN ADULT) TABS Take 1 tablet by mouth daily.    [provider]  ?nicotine (NICODERM CQ - DOSED IN MG/24 HR) 7 mg/24hr patch Place 1 patch (7 mg total) onto the skin daily. 12/20/21   Rhetta Mura, MD  ?   ? ?Allergies    ?Patient has no known allergies.   ? ?Review of Systems   ?Review of Systems ? ?Physical Exam ?Updated Vital Signs ?BP 113/82   Pulse 83   Temp 98.1 ?F (36.7 ?C) (Oral)   Resp (!) 22   Ht 6' (1.829 m)   Wt 110.7 kg   SpO2 96%   BMI 33.09 kg/m?  ?Physical Exam ?Vitals and nursing note reviewed.  ?Constitutional:   ?   Appearance: He is well-developed.  ?HENT:  ?   Head: Normocephalic.  ?   Comments: Periorbital hematoma bilaterally. ?Eyes:  ?   Pupils: Pupils are equal, round, and reactive to light.  ?Neck:  ?    Vascular: No JVD.  ?Cardiovascular:  ?   Rate and Rhythm: Normal rate and regular rhythm.  ?   Heart sounds: No murmur heard. ?  No friction rub. No gallop.  ?Pulmonary:  ?   Effort: No respiratory distress.  ?   Breath sounds: No wheezing.  ?Abdominal:  ?   General: There is no distension.  ?   Tenderness: There is no abdominal tenderness. There is no guarding or rebound.  ?Musculoskeletal:     ?   General: Normal range of motion.  ?   Cervical back: Normal range of motion and neck supple.  ?Skin: ?   Coloration: Skin is not pale.  ?   Findings: No rash.  ?Neurological:  ?   Mental Status: He is alert and oriented to person, place, and time.  ?Psychiatric:     ?   Behavior: Behavior normal.  ? ? ?ED Results / Procedures / Treatments   ?Labs ?(all labs ordered are listed, but only abnormal results are displayed) ?Labs Reviewed  ?ETHANOL - Abnormal; Notable for the following components:  ?    Result Value  ? Alcohol, Ethyl (B) 190 (*)   ? All other components within normal limits  ?ACETAMINOPHEN  LEVEL - Abnormal; Notable for the following components:  ? Acetaminophen (Tylenol), Serum <10 (*)   ? All other components within normal limits  ?RAPID URINE DRUG SCREEN, HOSP PERFORMED - Abnormal; Notable for the following components:  ? Benzodiazepines POSITIVE (*)   ? All other components within normal limits  ?COMPREHENSIVE METABOLIC PANEL - Abnormal; Notable for the following components:  ? CO2 20 (*)   ? Glucose, Bld 136 (*)   ? Calcium 8.8 (*)   ? Total Protein 8.4 (*)   ? AST 117 (*)   ? ALT 130 (*)   ? All other components within normal limits  ?CBC WITH DIFFERENTIAL/PLATELET  ? ? ?EKG ?None ? ?Radiology ?No results found. ? ?Procedures ?Procedures  ? ? ?Medications Ordered in ED ?Medications  ?naloxone (NARCAN) nasal spray 4 mg/0.1 mL (has no administration in time range)  ? ? ?ED Course/ Medical Decision Making/ A&P ?  ?                        ?Medical Decision Making ?Amount and/or Complexity of Data  Reviewed ?Labs: ordered. ? ?Risk ?Prescription drug management. ? ? ?46 yo M with a chief complaints of a episode where he was unconscious.  The patient responded appropriately to Narcan but adamantly denies doing any illegal drugs.  He tells me that he has been drinking today.  He has big bruises around his eyes which she tells me are actually improving over the course of the week.  He has no other obvious injury on exam.  We will observe in the ED.  Reassess. ? ?Patient resting comfortable.  Able to awake and have a conversation.  D/c home.  PCP follow up. ? ?9:15 PM:  I have discussed the diagnosis/risks/treatment options with the patient.  Evaluation and diagnostic testing in the emergency department does not suggest an emergent condition requiring admission or immediate intervention beyond what has been performed at this time.  They will follow up with  PCP. We also discussed returning to the ED immediately if new or worsening sx occur. We discussed the sx which are most concerning (e.g., sudden worsening pain, fever, inability to tolerate by mouth) that necessitate immediate return. Medications administered to the patient during their visit and any new prescriptions provided to the patient are listed below. ? ?Medications given during this visit ?Medications  ?naloxone (NARCAN) nasal spray 4 mg/0.1 mL (has no administration in time range)  ? ? ? ?The patient appears reasonably screen and/or stabilized for discharge and I doubt any other medical condition or other [facility] requiring further screening, evaluation, or treatment in the ED at this time prior to discharge.  ? ? ? ? ? ? ? ? ?Final Clinical Impression(s) / ED Diagnoses ?Final diagnoses:  ?Opiate overdose, accidental or unintentional, initial encounter (HCC)  ? ? ?Rx / DC Orders ?ED Discharge Orders   ? ? None  ? ?  ? ? ?  ?Melene Plan, DO ?12/21/21 2115 ? ?

## 2021-12-21 NOTE — ED Triage Notes (Addendum)
Pt BIB EMS. Pt was found outside the [employer], unresponsive in a bush. Fire Dept gave the pt Narcan. Pt is now alert and does not remember what he took. Pt states that he thinks that he was laced. Pt reports having a hx of seizures, pt has 2 black eyes. Pt denies drug use but admits to having 3 small airplane bottles of alcohol.  ?

## 2021-12-21 NOTE — ED Triage Notes (Signed)
Pt here for a Dr's note for work. Pt was seen in the ED on 3/31 after having a grand-mal seizure, pt states he was admitted and discharged yesterday morning. Pt was given a Dr's note but believes it fell out of his pocket, pt requesting one from his admission to give to employer. ?

## 2021-12-21 NOTE — ED Provider Notes (Signed)
?[facility] EMERGENCY DEPARTMENT ?Provider Note ? ? ?CSN: [id number] ?Arrival date & time: 12/21/21  1317 ? ?  ? ?History ?Chief Complaint  ?Patient presents with  ? Letter for School/Work  ? ? ?Lucas Williams is a 46 y.o. male with h/o seizures and EtOH use disorder presents to the ED because he lost his work note on discharge. He reports he is feeling much better, and is eager to work during third shift. Denies any seizure like activity and reports compliancy with his medications. No complaints at this time.  ? ?HPI ? ?  ? ?Home Medications ?Prior to Admission medications   ?Medication Sig Start Date End Date Taking? Authorizing Provider  ?ibuprofen (ADVIL) 600 MG tablet Take 1 tablet (600 mg total) by mouth every 6 (six) hours as needed. 05/10/20   Elpidio Anis, PA-C  ?levETIRAcetam (KEPPRA) 500 MG tablet Take 1 tablet (500 mg total) by mouth 2 (two) times daily. Please call and make overdue appt for further refills 12/20/21   Rhetta Mura, MD  ?Multiple Vitamin (MULTIVITAMIN ADULT) TABS Take 1 tablet by mouth daily.    [provider]  ?nicotine (NICODERM CQ - DOSED IN MG/24 HR) 7 mg/24hr patch Place 1 patch (7 mg total) onto the skin daily. 12/20/21   Rhetta Mura, MD  ?   ? ?Allergies    ?Patient has no known allergies.   ? ?Review of Systems   ?Review of Systems  ?All other systems reviewed and are negative. ? ?Physical Exam ?Updated Vital Signs ?There were no vitals taken for this visit. ?Physical Exam ?Vitals and nursing note reviewed.  ?Constitutional:   ?   Appearance: Normal appearance.  ?Eyes:  ?   General: No scleral icterus. ?   Extraocular Movements: Extraocular movements intact.  ?   Comments: Bruising under bilateral eyes.  ?Pulmonary:  ?   Effort: Pulmonary effort is normal. No respiratory distress.  ?Skin: ?   General: Skin is dry.  ?   Findings: No rash.  ?Neurological:  ?   General: No focal deficit present.  ?   Mental Status: He is alert. Mental status  is at baseline.  ?   Gait: Gait normal.  ?Psychiatric:     ?   Mood and Affect: Mood normal.  ? ? ?ED Results / Procedures / Treatments   ?Labs ?(all labs ordered are listed, but only abnormal results are displayed) ?Labs Reviewed - No data to display ? ?EKG ?None ? ?Radiology ?No results found. ? ?Procedures ?Procedures  ? ? ?Medications Ordered in ED ?Medications - No data to display ? ?ED Course/ Medical Decision Making/ A&P ?  ?                        ?Medical Decision Making ? ?46 year old male was discharged from the emergency department yesterday on 12-20-2021.  He reports he was just returned to work tomorrow but he lost the note that was in his wallet.  He reports he felt safe to go back to work.  He denies any symptoms.  Other than the dark circles under his eyes, he is well-appearing.  We will print him out a work excuse for the days that he was admitted to the hospital. ? ?Final Clinical Impression(s) / ED Diagnoses ?Final diagnoses:  ?Encounter to obtain excuse from work  ? ? ?Rx / DC Orders ?ED Discharge Orders   ? ? None  ? ?  ? ? ?  ?  Achille Rich, PA-C ?12/22/21 1551 ? ?  ?Margarita Grizzle, MD ?12/29/21 1520 ? ?

## 2021-12-22 ENCOUNTER — Emergency Department (HOSPITAL_COMMUNITY)
Admission: EM | Admit: 2021-12-22 | Discharge: 2021-12-22 | Discharge status: Against Medical Advice | Payer: 59 | Attending: Emergency Medicine | Admitting: Emergency Medicine

## 2021-12-22 ENCOUNTER — Encounter: Payer: Self-pay | Admitting: Neurology

## 2021-12-22 ENCOUNTER — Encounter (HOSPITAL_COMMUNITY): Payer: Self-pay | Admitting: Emergency Medicine

## 2021-12-22 DIAGNOSIS — W1839XA Other fall on same level, initial encounter: Secondary | ICD-10-CM | POA: Diagnosis not present

## 2021-12-22 DIAGNOSIS — Z5321 Procedure and treatment not carried out due to patient leaving prior to being seen by health care provider: Secondary | ICD-10-CM | POA: Diagnosis not present

## 2021-12-22 DIAGNOSIS — F10120 Alcohol abuse with intoxication, uncomplicated: Secondary | ICD-10-CM | POA: Insufficient documentation

## 2021-12-22 DIAGNOSIS — Y9301 Activity, walking, marching and hiking: Secondary | ICD-10-CM | POA: Insufficient documentation

## 2021-12-22 NOTE — ED Notes (Signed)
Pt noted to be walking out of the ER. Pt encouraged to stay to be seen by provider. Pt refused and continuing to leave. EDP aware.  ?

## 2021-12-22 NOTE — ED Notes (Signed)
Patient now complains of 3/10 right side pain. He says this is the side he landed on when he fell. He also reports hitting his head.  ?

## 2021-12-22 NOTE — ED Triage Notes (Signed)
Patient presents post fall on the side walk due to alcohol intoxication. Patient reports drinking alcohol all day. He was walking when he fell. He does not have any complaints of pain. It is unclear if the patient hit his head.  ? ?Hx: ETOH abuse ? ?EMS vitals: ?140/92 BP ?91 HR ?18 RR ?96% SPO2 on room air ?99 CBG ?98.8 Temp ?

## 2021-12-22 NOTE — ED Provider Notes (Signed)
The patient eloped prior to my evaluation. Ramon Dredge, RN alerted Dr. Freida Busman about the patient eloping.  ?  ?Achille Rich, PA-C ?12/22/21 1335 ? ?  ?Lorre Nick, MD ?12/25/21 [phone] ? ?

## 2022-01-11 ENCOUNTER — Encounter (HOSPITAL_COMMUNITY): Payer: Self-pay

## 2022-01-11 ENCOUNTER — Emergency Department (HOSPITAL_COMMUNITY)
Admission: EM | Admit: 2022-01-11 | Discharge: 2022-01-11 | Disposition: A | Discharge status: Home / Self Care | Payer: 59 | Attending: Emergency Medicine | Admitting: Emergency Medicine

## 2022-01-11 ENCOUNTER — Other Ambulatory Visit: Payer: Self-pay

## 2022-01-11 DIAGNOSIS — Z79899 Other long term (current) drug therapy: Secondary | ICD-10-CM | POA: Insufficient documentation

## 2022-01-11 DIAGNOSIS — J449 Chronic obstructive pulmonary disease, unspecified: Secondary | ICD-10-CM | POA: Insufficient documentation

## 2022-01-11 DIAGNOSIS — G40909 Epilepsy, unspecified, not intractable, without status epilepticus: Secondary | ICD-10-CM | POA: Insufficient documentation

## 2022-01-11 DIAGNOSIS — F10920 Alcohol use, unspecified with intoxication, uncomplicated: Secondary | ICD-10-CM

## 2022-01-11 DIAGNOSIS — Y906 Blood alcohol level of 120-199 mg/100 ml: Secondary | ICD-10-CM | POA: Diagnosis not present

## 2022-01-11 DIAGNOSIS — F10129 Alcohol abuse with intoxication, unspecified: Secondary | ICD-10-CM | POA: Diagnosis not present

## 2022-01-11 DIAGNOSIS — R569 Unspecified convulsions: Secondary | ICD-10-CM | POA: Diagnosis present

## 2022-01-11 LAB — COMPREHENSIVE METABOLIC PANEL
ALT: 127 U/L — ABNORMAL HIGH (ref 0–44)
AST: 91 U/L — ABNORMAL HIGH (ref 15–41)
Albumin: 3.8 g/dL (ref 3.5–5.0)
Alkaline Phosphatase: 128 U/L — ABNORMAL HIGH (ref 38–126)
Anion gap: 9 (ref 5–15)
BUN: 10 mg/dL (ref 6–20)
CO2: 27 mmol/L (ref 22–32)
Calcium: 8.3 mg/dL — ABNORMAL LOW (ref 8.9–10.3)
Chloride: 106 mmol/L (ref 98–111)
Creatinine, Ser: 0.89 mg/dL (ref 0.61–1.24)
GFR, Estimated: >60 mL/min (ref >60)
Glucose, Bld: 101 mg/dL — ABNORMAL HIGH (ref 70–99)
Potassium: 3.7 mmol/L (ref 3.5–5.1)
Sodium: 142 mmol/L (ref 135–145)
Total Bilirubin: 0.6 mg/dL (ref 0.3–1.2)
Total Protein: 7.3 g/dL (ref 6.5–8.1)

## 2022-01-11 LAB — CBC WITH DIFFERENTIAL/PLATELET
Abs Immature Granulocytes: 0.01 10*3/uL (ref 0.00–0.07)
Basophils Absolute: 0.1 10*3/uL (ref 0.0–0.1)
Basophils Relative: 1 %
Eosinophils Absolute: 0.2 10*3/uL (ref 0.0–0.5)
Eosinophils Relative: 3 %
HCT: 40.8 % (ref 39.0–52.0)
Hemoglobin: 14.1 g/dL (ref 13.0–17.0)
Immature Granulocytes: 0 %
Lymphocytes Relative: 47 %
Lymphs Abs: 2.6 10*3/uL (ref 0.7–4.0)
MCH: 32.3 pg (ref 26.0–34.0)
MCHC: 34.6 g/dL (ref 30.0–36.0)
MCV: 93.6 fL (ref 80.0–100.0)
Monocytes Absolute: 0.6 10*3/uL (ref 0.1–1.0)
Monocytes Relative: 10 %
Neutro Abs: 2.1 10*3/uL (ref 1.7–7.7)
Neutrophils Relative %: 39 %
Platelets: 204 10*3/uL (ref 150–400)
RBC: 4.36 MIL/uL (ref 4.22–5.81)
RDW: 13.2 % (ref 11.5–15.5)
WBC: 5.5 10*3/uL (ref 4.0–10.5)
nRBC: 0 % (ref 0.0–0.2)

## 2022-01-11 LAB — ETHANOL: Alcohol, Ethyl (B): 197 mg/dL — ABNORMAL HIGH (ref <10)

## 2022-01-11 NOTE — ED Provider Notes (Signed)
?[facility]-EMERGENCY DEPT ?Provider Note ? ? ?CSN: [id number] ?Arrival date & time: 01/11/22  0207 ? ?  ? ?History ? ?Chief Complaint  ?Patient presents with  ? Seizures  ? ? ?Lucas Williams is a 46 y.o. male. ? ?The history is provided by the patient and medical records.  ?Seizures ? ?46 y.o. M with hx of seizure disorder, alcohol abuse, COPD, hx of TBI, substance induced mood disorder, presenting to the ED for reported seizure.  States he was walking home from work around 10:40PM to catch the bus and woke up on the sidewalk around 2am.  He states he got up and walked into the gas station to call for help.  He suspects he had a seizure, denied any prodrome prior to this.  He takes keppra for seizures, states taking as directed and has bottle with him here in the ED.  Denies EtOH or drug use tonight. ? ?Chart reviewed-- patient did have hospital admission end of march for alcohol withdrawal seizure. ? ?Home Medications ?Prior to Admission medications   ?Medication Sig Start Date End Date Taking? Authorizing Provider  ?ibuprofen (ADVIL) 600 MG tablet Take 1 tablet (600 mg total) by mouth every 6 (six) hours as needed. 05/10/20   Elpidio Anis, PA-C  ?levETIRAcetam (KEPPRA) 500 MG tablet Take 1 tablet (500 mg total) by mouth 2 (two) times daily. Please call and make overdue appt for further refills 12/20/21   Rhetta Mura, MD  ?Multiple Vitamin (MULTIVITAMIN ADULT) TABS Take 1 tablet by mouth daily.    [provider]  ?nicotine (NICODERM CQ - DOSED IN MG/24 HR) 7 mg/24hr patch Place 1 patch (7 mg total) onto the skin daily. 12/20/21   Rhetta Mura, MD  ?   ? ?Allergies    ?Patient has no known allergies.   ? ?Review of Systems   ?Review of Systems  ?Neurological:  Positive for seizures.  ?All other systems reviewed and are negative. ? ?Physical Exam ?Updated Vital Signs ?BP (!) 157/105 (BP Location: Right Arm)   Pulse 83   Temp 98 ?F (36.7 ?C) (Oral)   Resp (!) 23   SpO2  96%  ? ?Physical Exam ?Vitals and nursing note reviewed.  ?Constitutional:   ?   Appearance: He is well-developed.  ?   Comments: Breath smells of EtOH  ?HENT:  ?   Head: Normocephalic and atraumatic.  ?   Comments: No visible head trauma ?   Mouth/Throat:  ?   Comments: No tongue injury ?Lower central incisors are absent (chronic) ?Eyes:  ?   Conjunctiva/sclera: Conjunctivae normal.  ?   Pupils: Pupils are equal, round, and reactive to light.  ?Cardiovascular:  ?   Rate and Rhythm: Normal rate and regular rhythm.  ?   Heart sounds: Normal heart sounds.  ?Pulmonary:  ?   Effort: Pulmonary effort is normal.  ?   Breath sounds: Normal breath sounds.  ?Abdominal:  ?   General: Bowel sounds are normal.  ?   Palpations: Abdomen is soft.  ?Musculoskeletal:     ?   General: Normal range of motion.  ?   Cervical back: Normal range of motion.  ?Skin: ?   General: Skin is warm and dry.  ?Neurological:  ?   Mental Status: He is alert and oriented to person, place, and time.  ?   Comments: AAOx3, able to answer questions and follow commands, moving extremities without focal deficits  ? ? ?ED Results / Procedures / Treatments   ?  Labs ?(all labs ordered are listed, but only abnormal results are displayed) ?Labs Reviewed  ?COMPREHENSIVE METABOLIC PANEL - Abnormal; Notable for the following components:  ?    Result Value  ? Glucose, Bld 101 (*)   ? Calcium 8.3 (*)   ? AST 91 (*)   ? ALT 127 (*)   ? Alkaline Phosphatase 128 (*)   ? All other components within normal limits  ?ETHANOL - Abnormal; Notable for the following components:  ? Alcohol, Ethyl (B) 197 (*)   ? All other components within normal limits  ?CBC WITH DIFFERENTIAL/PLATELET  ?RAPID URINE DRUG SCREEN, HOSP PERFORMED  ? ? ?EKG ?None ? ?Radiology ?No results found. ? ?Procedures ?Procedures  ? ? ?Medications Ordered in ED ?Medications - No data to display ? ?ED Course/ Medical Decision Making/ A&P ?  ?                        ?Medical Decision Making ?Amount and/or  Complexity of Data Reviewed ?Labs: ordered. ?ECG/medicine tests: ordered and independent interpretation performed. ? ? ?46 year old male presenting to the ED with seizure.  States he was walking home from work when he had a seizure on the sidewalk.  Then was able to get up and go on the gas station and call for help.  He denies any drug or alcohol use.  Clinically, he appears intoxicated on exam and breath smells heavily of alcohol.  He does not have any visible head trauma, no tongue or dental injury, although poor dentition and multiple missing teeth.  Did have recent admission for alcohol withdrawal seizure.  We will check labs. ? ?Labs as above--ethanol 197.  He does have transaminitis but very similar to recent values.  CBC is normal.  Will monitor here until more clinically sober. ? ?5:23 AM ?Has been observed here for a few hours without recurrent seizure activity.  Given he has alcohol in his system, I have very low suspicion for alcohol withdrawal seizure.  Feel he is stable for discharge.  We will have him continue his Keppra and follow-up with neurology.  Placed on driving precautions until seizure free for 6 months or cleared by neurology.  Can return here for any new or acute changes. ? ?Final Clinical Impression(s) / ED Diagnoses ?Final diagnoses:  ?Alcoholic intoxication without complication (HCC)  ?Seizure disorder (HCC)  ? ? ?Rx / DC Orders ?ED Discharge Orders   ? ? None  ? ?  ? ? ?  ?Garlon Hatchet, PA-C ?01/11/22 0530 ? ?  ?Zadie Rhine, MD ?01/11/22 [phone] ? ?

## 2022-01-11 NOTE — Discharge Instructions (Signed)
Continue your keppra, do not skip doses. ?Follow-up with your neurologist. ?No driving until seizure free for 6 months or cleared by neurology to do so. ?Return here for new concerns. ?
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
## 2022-01-12 ENCOUNTER — Emergency Department (HOSPITAL_COMMUNITY)
Admission: EM | Admit: 2022-01-12 | Discharge: 2022-01-12 | Disposition: A | Discharge status: Home / Self Care | Payer: 59 | Attending: Emergency Medicine | Admitting: Emergency Medicine

## 2022-01-12 ENCOUNTER — Encounter (HOSPITAL_COMMUNITY): Payer: Self-pay

## 2022-01-12 ENCOUNTER — Other Ambulatory Visit: Payer: Self-pay

## 2022-01-12 DIAGNOSIS — G40909 Epilepsy, unspecified, not intractable, without status epilepticus: Secondary | ICD-10-CM | POA: Insufficient documentation

## 2022-01-12 DIAGNOSIS — Z79899 Other long term (current) drug therapy: Secondary | ICD-10-CM | POA: Diagnosis not present

## 2022-01-12 DIAGNOSIS — N179 Acute kidney failure, unspecified: Secondary | ICD-10-CM | POA: Diagnosis not present

## 2022-01-12 DIAGNOSIS — J449 Chronic obstructive pulmonary disease, unspecified: Secondary | ICD-10-CM | POA: Diagnosis not present

## 2022-01-12 DIAGNOSIS — R569 Unspecified convulsions: Secondary | ICD-10-CM

## 2022-01-12 LAB — COMPREHENSIVE METABOLIC PANEL
ALT: 124 U/L — ABNORMAL HIGH (ref 0–44)
AST: 89 U/L — ABNORMAL HIGH (ref 15–41)
Albumin: 3.8 g/dL (ref 3.5–5.0)
Alkaline Phosphatase: 134 U/L — ABNORMAL HIGH (ref 38–126)
Anion gap: 9 (ref 5–15)
BUN: 16 mg/dL (ref 6–20)
CO2: 26 mmol/L (ref 22–32)
Calcium: 9 mg/dL (ref 8.9–10.3)
Chloride: 106 mmol/L (ref 98–111)
Creatinine, Ser: 1.44 mg/dL — ABNORMAL HIGH (ref 0.61–1.24)
GFR, Estimated: >60 mL/min (ref >60)
Glucose, Bld: 100 mg/dL — ABNORMAL HIGH (ref 70–99)
Potassium: 3.9 mmol/L (ref 3.5–5.1)
Sodium: 141 mmol/L (ref 135–145)
Total Bilirubin: 0.3 mg/dL (ref 0.3–1.2)
Total Protein: 7.4 g/dL (ref 6.5–8.1)

## 2022-01-12 LAB — CBC
HCT: 41.9 % (ref 39.0–52.0)
Hemoglobin: 14.2 g/dL (ref 13.0–17.0)
MCH: 32.2 pg (ref 26.0–34.0)
MCHC: 33.9 g/dL (ref 30.0–36.0)
MCV: 95 fL (ref 80.0–100.0)
Platelets: 216 10*3/uL (ref 150–400)
RBC: 4.41 MIL/uL (ref 4.22–5.81)
RDW: 13.4 % (ref 11.5–15.5)
WBC: 6.9 10*3/uL (ref 4.0–10.5)
nRBC: 0 % (ref 0.0–0.2)

## 2022-01-12 LAB — ETHANOL: Alcohol, Ethyl (B): 164 mg/dL — ABNORMAL HIGH (ref <10)

## 2022-01-12 LAB — RAPID URINE DRUG SCREEN, HOSP PERFORMED
Amphetamines: NOT DETECTED
Barbiturates: NOT DETECTED
Benzodiazepines: NOT DETECTED
Cocaine: NOT DETECTED
Opiates: NOT DETECTED
Tetrahydrocannabinol: NOT DETECTED

## 2022-01-12 MED ORDER — THIAMINE HCL 100 MG PO TABS
100.0000 mg | ORAL_TABLET | Freq: Every day | ORAL | Status: DC
  Administered 2022-01-12: 100 mg via ORAL
  Filled 2022-01-12: qty 1

## 2022-01-12 MED ORDER — THIAMINE HCL 100 MG/ML IJ SOLN: 100.0000 mg | Freq: Every day | INTRAMUSCULAR | Status: DC

## 2022-01-12 MED ORDER — ADULT MULTIVITAMIN W/MINERALS CH
1.0000 | ORAL_TABLET | Freq: Every day | ORAL | Status: DC
  Administered 2022-01-12: 1 via ORAL
  Filled 2022-01-12: qty 1

## 2022-01-12 MED ORDER — LEVETIRACETAM 750 MG PO TABS: 750.0000 mg | ORAL_TABLET | Freq: Two times a day (BID) | ORAL | 0 refills | Status: DC

## 2022-01-12 MED ORDER — LORAZEPAM 2 MG/ML IJ SOLN: 1.0000 mg | INTRAMUSCULAR | Status: DC | PRN

## 2022-01-12 MED ORDER — FOLIC ACID 1 MG PO TABS
1.0000 mg | ORAL_TABLET | Freq: Every day | ORAL | Status: DC
  Administered 2022-01-12: 1 mg via ORAL
  Filled 2022-01-12: qty 1

## 2022-01-12 MED ORDER — LORAZEPAM 1 MG PO TABS
1.0000 mg | ORAL_TABLET | ORAL | Status: DC | PRN
  Administered 2022-01-12: 1 mg via ORAL
  Filled 2022-01-12: qty 1

## 2022-01-12 NOTE — Discharge Instructions (Addendum)
Department of Motor Vehicle [facility]) of [place] regulations for seizures - It is the patient?s responsibility to report the incidence of the seizure in the state of [place]. [place] has no statutory provision requiring physicians to report patients diagnosed with epilepsy or seizures to a central state agency. ? ?The recommended DMV regulation requirement for a driver in [place] for an individual with a seizure is that they be seizure-free for 6-12 months. However, the DMV may consider the following exceptions to this general rule where: (1) a physician-directed change in medication causes a seizure and the individual immediately resumes the previous therapy which controlled seizures; (2) there is a history of nocturnal seizures or seizures which do not involve loss of consciousness, loss of control of motor function, or loss of appropriate sensation and information process; and (3) an individual has a seizure disorder preceded by an aura (warning) lasting 2-3 minutes. While the [facility] may also give consideration to other unusual circumstances which may affect the general requirement that drivers be seizure-free for 6-12 months, interpretation of these circumstances and assignment of restrictions is at the discretion of the Medical Advisor. The DMV also considers compliance with medical therapy essential for safe driving. [facility] [place] Physician's Guide to [employer] (June, 1995 ed.)] The Department learns of an individual's condition by inquiring on the application form or renewal form, a physician's report to the [facility], an accident report or from correspondence from the individual. The person may be required to submit a Medical Report Form either annually or semi-annually. ? ? ?Given your AKI, recommend you drink plenty of electrolyte containing fluids at home and follow-up outpatient for a recheck of your kidney function.  I will increase your dose of Keppra to 750 mg twice daily.  Recommend you follow-up  outpatient with a neurologist. ?

## 2022-01-12 NOTE — ED Notes (Signed)
Patient discharge instructions reviewed with the patient. The patient verbalized understanding of instructions. Patient discharged. 
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
## 2022-01-12 NOTE — ED Triage Notes (Signed)
Pt has had multiple seizures over the last three days. Pt has history of seizures was seen at OSH for same. Pt has not made neurology appt but is compliant with meds. ;ast drink was 4 days ago. Pt has history of alcohol withdrawal seizures ?

## 2022-01-12 NOTE — ED Provider Notes (Signed)
?[facility] ?Provider Note ? ? ?CSN: [id number] ?Arrival date & time: 01/12/22  0208 ? ?  ? ?History ? ?Chief Complaint  ?Patient presents with  ? Seizures  ? ? ?Lex Traudt is a 46 y.o. male. ? ? ?Seizures ? ?46 year old male with a history of seizure disorder on Keppra, alcohol abuse, COPD, history of TBI who presents to the emergency department after a seizure.  The patient states that he was by himself earlier today at home when he believes he had a seizure.  He woke up on the floor in a postictal state.  He states that he does not drink every single day.  Reportedly has a history of alcohol withdrawal seizures and had an admission for the same back in early April of this year.  States that he had a drink before he came into the emergency department tonight.   He states that he is compliant with his Keppra.  His last neurology appointment was a few months ago.  On chart review, the patient was seen in consultation in the hospital on 04/11/2021 during which time he had an increase in his Keppra to 750 mg twice daily with a plan for follow-up with an outpatient neurologist.  It appears that his Keppra dose has since been reduced back to 500 mg twice daily. ? ?Home Medications ?Prior to Admission medications   ?Medication Sig Start Date End Date Taking? Authorizing Provider  ?ibuprofen (ADVIL) 600 MG tablet Take 1 tablet (600 mg total) by mouth every 6 (six) hours as needed. 05/10/20   Charlann Lange, PA-C  ?levETIRAcetam (KEPPRA) 500 MG tablet Take 1 tablet (500 mg total) by mouth 2 (two) times daily. Please call and make overdue appt for further refills 12/20/21   Nita Sells, MD  ?Multiple Vitamin (MULTIVITAMIN ADULT) TABS Take 1 tablet by mouth daily.    [provider]  ?nicotine (NICODERM CQ - DOSED IN MG/24 HR) 7 mg/24hr patch Place 1 patch (7 mg total) onto the skin daily. 12/20/21   Nita Sells, MD  ?   ? ?Allergies    ?Patient has no known  allergies.   ? ?Review of Systems   ?Review of Systems  ?Neurological:  Positive for seizures.  ?All other systems reviewed and are negative. ? ?Physical Exam ?Updated Vital Signs ?BP (!) 152/127 (BP Location: Left Arm)   Pulse 99   Temp 98 ?F (36.7 ?C) (Oral)   Resp 17   Ht 6' (1.829 m)   Wt 110 kg   SpO2 96%   BMI 32.89 kg/m?  ?Physical Exam ?Vitals and nursing note reviewed.  ?Constitutional:   ?   General: He is not in acute distress. ?HENT:  ?   Head: Normocephalic and atraumatic.  ?Eyes:  ?   Conjunctiva/sclera: Conjunctivae normal.  ?   Pupils: Pupils are equal, round, and reactive to light.  ?Cardiovascular:  ?   Rate and Rhythm: Normal rate and regular rhythm.  ?Pulmonary:  ?   Effort: Pulmonary effort is normal. No respiratory distress.  ?Abdominal:  ?   General: There is no distension.  ?   Tenderness: There is no guarding.  ?Musculoskeletal:     ?   General: No deformity or signs of injury.  ?   Cervical back: Neck supple.  ?Skin: ?   Findings: No lesion or rash.  ?Neurological:  ?   General: No focal deficit present.  ?   Mental Status: He is alert. Mental status is  at baseline.  ?   Comments: MENTAL STATUS EXAM:    ?Orientation: Alert and oriented to person, place and time.  ?Memory: Cooperative, follows commands well.  ?Language: Speech is clear and language is normal.  ? ?CRANIAL NERVES:    ?CN 2 (Optic): Visual fields intact to confrontation.  ?CN 3,4,6 (EOM): Pupils equal and reactive to light. Full extraocular eye movement without nystagmus.  ?CN 5 (Trigeminal): Facial sensation is normal, no weakness of masticatory muscles.  ?CN 7 (Facial): No facial weakness or asymmetry.  ?CN 8 (Auditory): Auditory acuity grossly normal.  ?CN 9,10 (Glossophar): The uvula is midline, the palate elevates symmetrically.  ?CN 11 (spinal access): Normal sternocleidomastoid and trapezius strength.  ?CN 12 (Hypoglossal): The tongue is midline. No atrophy or fasciculations..  ? ?MOTOR:  Muscle Strength: 5/5RUE,  5/5LUE, 5/5RLE, 5/5LLE.  ? ?COORDINATION:   Intact finger-to-nose, no tremor.  ? ?SENSATION:   Intact to light touch all four extremities. ? ?GAIT: Gait normal without ataxia ?  ? ? ?ED Results / Procedures / Treatments   ?Labs ?(all labs ordered are listed, but only abnormal results are displayed) ?Labs Reviewed  ?COMPREHENSIVE METABOLIC PANEL - Abnormal; Notable for the following components:  ?    Result Value  ? Glucose, Bld 100 (*)   ? Creatinine, Ser 1.44 (*)   ? AST 89 (*)   ? ALT 124 (*)   ? Alkaline Phosphatase 134 (*)   ? All other components within normal limits  ?ETHANOL - Abnormal; Notable for the following components:  ? Alcohol, Ethyl (B) 164 (*)   ? All other components within normal limits  ?CBC  ?RAPID URINE DRUG SCREEN, HOSP PERFORMED  ?LEVETIRACETAM LEVEL  ? ? ?EKG ?None ? ?Radiology ?No results found. ? ?Procedures ?Procedures  ? ? ?Medications Ordered in ED ?Medications  ?LORazepam (ATIVAN) tablet 1-4 mg (1 mg Oral Given 01/12/22 0911)  ?  Or  ?LORazepam (ATIVAN) injection 1-4 mg ( Intravenous See Alternative 01/12/22 0911)  ?thiamine tablet 100 mg (100 mg Oral Given 01/12/22 0911)  ?  Or  ?thiamine (B-1) injection 100 mg ( Intravenous See Alternative 01/12/22 0911)  ?folic acid (FOLVITE) tablet 1 mg (1 mg Oral Given 01/12/22 0911)  ?multivitamin with minerals tablet 1 tablet (1 tablet Oral Given 01/12/22 0911)  ? ? ?ED Course/ Medical Decision Making/ A&P ?  ?                        ?Medical Decision Making ?Risk ?OTC drugs. ?Prescription drug management. ? ? ?46 year old male with a history of seizure disorder on Keppra, alcohol abuse, COPD, history of TBI who presents to the emergency department after a seizure.  The patient states that he was by himself earlier today at home when he believes he had a seizure.  He woke up on the floor in a postictal state.  He states that he does not drink every single day.  Reportedly has a history of alcohol withdrawal seizures and had an admission for the same  back in early April of this year.  States that he had a drink before he came into the emergency department tonight.   He states that he is compliant with his Keppra.  His last neurology appointment was a few months ago.  On chart review, the patient was seen in consultation in the hospital on 04/11/2021 during which time he had an increase in his Keppra to 750 mg twice daily with a plan  for follow-up with an outpatient neurologist.  It appears that his Keppra dose has since been reduced back to 500 mg twice daily. ? ?On arrival, the patient was afebrile, hypertensive BP 161/100, saturating well on room air.  He was observed in the emergency department over the course of 7 hours and had no recurrence of seizure activity.  His initial laboratory work-up was concerning for UDS that was negative, ethanol level elevated to 164, CBC unremarkable, CMP without significant electrolyte abnormality, evidence of AKI with a creatinine of 1.44, evidence of liver injury with AST 8 9, ALT 124, T. bili normal.  The patient is able to orally rehydrate. ? ?Lower concern for alcohol withdrawal seizures the patient states that he is not a daily alcohol user.  Concern for possible breakthrough seizure given the patient's known seizure disorder.  He has been seen in the emergency department previously for this within the last week.  On my assessment, the patient was clinically sober with a normal neurologic exam, back to his baseline mental status.  I provided return precautions and driving precautions to the patient.  Given the patient's breakthrough seizures, will increase the patient's Keppra dose back to 750 twice daily and have the patient follow-up with his outpatient neurologist.  Ambulatory, tolerating oral intake, does not appear to be in acute alcohol withdrawal at this time, stable for discharge. ? ?Final Clinical Impression(s) / ED Diagnoses ?Final diagnoses:  ?Seizure ([place])  ?AKI (acute kidney injury) ([place])  ? ? ?Rx / DC  Orders ?ED Discharge Orders   ? ? None  ? ?  ? ? ?  ?Regan Lemming, MD ?01/12/22 [id number] ? ?

## 2022-01-14 LAB — LEVETIRACETAM LEVEL: Levetiracetam Lvl: 10.4 ug/mL (ref 10.0–40.0)

## 2022-01-18 ENCOUNTER — Encounter (HOSPITAL_COMMUNITY): Payer: Self-pay

## 2022-01-18 ENCOUNTER — Emergency Department (HOSPITAL_COMMUNITY): Payer: 59

## 2022-01-18 ENCOUNTER — Emergency Department (HOSPITAL_COMMUNITY)
Admission: EM | Admit: 2022-01-18 | Discharge: 2022-01-18 | Disposition: A | Discharge status: Home / Self Care | Payer: 59 | Attending: Emergency Medicine | Admitting: Emergency Medicine

## 2022-01-18 ENCOUNTER — Other Ambulatory Visit: Payer: Self-pay

## 2022-01-18 ENCOUNTER — Other Ambulatory Visit (HOSPITAL_COMMUNITY): Payer: Self-pay

## 2022-01-18 DIAGNOSIS — S0181XA Laceration without foreign body of other part of head, initial encounter: Secondary | ICD-10-CM | POA: Diagnosis not present

## 2022-01-18 DIAGNOSIS — Z23 Encounter for immunization: Secondary | ICD-10-CM | POA: Insufficient documentation

## 2022-01-18 DIAGNOSIS — J449 Chronic obstructive pulmonary disease, unspecified: Secondary | ICD-10-CM | POA: Insufficient documentation

## 2022-01-18 DIAGNOSIS — S0240FB Zygomatic fracture, left side, initial encounter for open fracture: Secondary | ICD-10-CM | POA: Insufficient documentation

## 2022-01-18 DIAGNOSIS — S0993XA Unspecified injury of face, initial encounter: Secondary | ICD-10-CM | POA: Diagnosis present

## 2022-01-18 MED ORDER — TETANUS-DIPHTH-ACELL PERTUSSIS 5-2.5-18.5 LF-MCG/0.5 IM SUSY
0.5000 mL | PREFILLED_SYRINGE | Freq: Once | INTRAMUSCULAR | Status: AC
Start: 2022-01-18 — End: 2022-01-18
  Administered 2022-01-18: 0.5 mL via INTRAMUSCULAR
  Filled 2022-01-18: qty 0.5

## 2022-01-18 MED ORDER — LEVETIRACETAM 750 MG PO TABS
750.0000 mg | ORAL_TABLET | Freq: Two times a day (BID) | ORAL | 1 refills | Status: DC
  Filled 2022-01-18: qty 60, 30d supply, fill #0

## 2022-01-18 MED ORDER — CEPHALEXIN 500 MG PO CAPS
500.0000 mg | ORAL_CAPSULE | Freq: Four times a day (QID) | ORAL | 0 refills | Status: DC
  Filled 2022-01-18: qty 28, 7d supply, fill #0

## 2022-01-18 MED ORDER — CEPHALEXIN 250 MG PO CAPS
500.0000 mg | ORAL_CAPSULE | Freq: Once | ORAL | Status: AC
  Administered 2022-01-18: 500 mg via ORAL
  Filled 2022-01-18: qty 2

## 2022-01-18 MED ORDER — LEVETIRACETAM 750 MG PO TABS
750.0000 mg | ORAL_TABLET | Freq: Once | ORAL | Status: AC
  Administered 2022-01-18: 750 mg via ORAL
  Filled 2022-01-18 (×2): qty 1

## 2022-01-18 MED ORDER — LIDOCAINE-EPINEPHRINE (PF) 2 %-1:200000 IJ SOLN
20.0000 mL | Freq: Once | INTRAMUSCULAR | Status: AC
  Administered 2022-01-18: 20 mL
  Filled 2022-01-18: qty 20

## 2022-01-18 NOTE — ED Notes (Signed)
MD Messick at bedside placing sutures.  ?
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
## 2022-01-18 NOTE — Discharge Instructions (Addendum)
Return for any problem.  ? ?Your laceration was repaired with absorbable sutures.  There is no need to return for removal of the sutures. ?
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
## 2022-01-18 NOTE — ED Notes (Signed)
Boss called to speak with pt, phone and number given to pt to update.  ?
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
## 2022-01-18 NOTE — ED Notes (Signed)
Pt given sandwich and soda per MD.  ?

## 2022-01-18 NOTE — ED Notes (Signed)
Pt ambulated in room independently with staff supervision for safety. States he has a small amount of dizziness upon standing. Educated pt on importance of slow position changes to avoid dizziness.  ?

## 2022-01-22 ENCOUNTER — Emergency Department (HOSPITAL_COMMUNITY): Payer: 59

## 2022-01-22 ENCOUNTER — Encounter (HOSPITAL_COMMUNITY): Payer: Self-pay | Admitting: Emergency Medicine

## 2022-01-22 ENCOUNTER — Emergency Department (HOSPITAL_COMMUNITY)
Admission: EM | Admit: 2022-01-22 | Discharge: 2022-01-22 | Discharge status: Against Medical Advice | Payer: 59 | Attending: Emergency Medicine | Admitting: Emergency Medicine

## 2022-01-22 DIAGNOSIS — S40022A Contusion of left upper arm, initial encounter: Secondary | ICD-10-CM | POA: Diagnosis present

## 2022-01-22 DIAGNOSIS — S0083XA Contusion of other part of head, initial encounter: Secondary | ICD-10-CM | POA: Insufficient documentation

## 2022-01-22 DIAGNOSIS — S0993XA Unspecified injury of face, initial encounter: Secondary | ICD-10-CM | POA: Insufficient documentation

## 2022-01-22 DIAGNOSIS — Z5321 Procedure and treatment not carried out due to patient leaving prior to being seen by health care provider: Secondary | ICD-10-CM | POA: Diagnosis not present

## 2022-01-22 DIAGNOSIS — Z01818 Encounter for other preprocedural examination: Secondary | ICD-10-CM | POA: Diagnosis not present

## 2022-01-22 LAB — COMPREHENSIVE METABOLIC PANEL
ALT: 148 U/L — ABNORMAL HIGH (ref 0–44)
AST: 119 U/L — ABNORMAL HIGH (ref 15–41)
Albumin: 4 g/dL (ref 3.5–5.0)
Alkaline Phosphatase: 128 U/L — ABNORMAL HIGH (ref 38–126)
Anion gap: 9 (ref 5–15)
BUN: 15 mg/dL (ref 6–20)
CO2: 21 mmol/L — ABNORMAL LOW (ref 22–32)
Calcium: 8.8 mg/dL — ABNORMAL LOW (ref 8.9–10.3)
Chloride: 111 mmol/L (ref 98–111)
Creatinine, Ser: 1.03 mg/dL (ref 0.61–1.24)
GFR, Estimated: >60 mL/min (ref >60)
Glucose, Bld: 107 mg/dL — ABNORMAL HIGH (ref 70–99)
Potassium: 3.7 mmol/L (ref 3.5–5.1)
Sodium: 141 mmol/L (ref 135–145)
Total Bilirubin: 0.4 mg/dL (ref 0.3–1.2)
Total Protein: 7.8 g/dL (ref 6.5–8.1)

## 2022-01-22 LAB — CBC WITH DIFFERENTIAL/PLATELET
Abs Immature Granulocytes: 0.02 10*3/uL (ref 0.00–0.07)
Basophils Absolute: 0.1 10*3/uL (ref 0.0–0.1)
Basophils Relative: 1 %
Eosinophils Absolute: 0.3 10*3/uL (ref 0.0–0.5)
Eosinophils Relative: 4 %
HCT: 40.9 % (ref 39.0–52.0)
Hemoglobin: 14.1 g/dL (ref 13.0–17.0)
Immature Granulocytes: 0 %
Lymphocytes Relative: 37 %
Lymphs Abs: 2.5 10*3/uL (ref 0.7–4.0)
MCH: 32.1 pg (ref 26.0–34.0)
MCHC: 34.5 g/dL (ref 30.0–36.0)
MCV: 93.2 fL (ref 80.0–100.0)
Monocytes Absolute: 0.7 10*3/uL (ref 0.1–1.0)
Monocytes Relative: 11 %
Neutro Abs: 3.2 10*3/uL (ref 1.7–7.7)
Neutrophils Relative %: 47 %
Platelets: 171 10*3/uL (ref 150–400)
RBC: 4.39 MIL/uL (ref 4.22–5.81)
RDW: 13.1 % (ref 11.5–15.5)
WBC: 6.7 10*3/uL (ref 4.0–10.5)
nRBC: 0 % (ref 0.0–0.2)

## 2022-01-22 LAB — ETHANOL: Alcohol, Ethyl (B): 329 mg/dL (ref <10)

## 2022-01-22 NOTE — ED Notes (Signed)
Lab called and states that a blue top tube is needed as well. Will attempt draw at a later time due to pt being heavily intoxicated and not wanting to be stuck again at the moment.  ?

## 2022-01-22 NOTE — ED Notes (Addendum)
Patient ambulated with steady gait out of department with belongings.  ?
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
## 2022-01-22 NOTE — ED Notes (Signed)
Patient given water and sandwich. 

## 2022-01-31 ENCOUNTER — Emergency Department (HOSPITAL_COMMUNITY): Payer: 59

## 2022-01-31 ENCOUNTER — Emergency Department (HOSPITAL_COMMUNITY)
Admission: EM | Admit: 2022-01-31 | Discharge: 2022-01-31 | Disposition: A | Discharge status: Home / Self Care | Payer: 59 | Attending: Emergency Medicine | Admitting: Emergency Medicine

## 2022-01-31 ENCOUNTER — Other Ambulatory Visit: Payer: Self-pay

## 2022-01-31 ENCOUNTER — Encounter (HOSPITAL_COMMUNITY): Payer: Self-pay

## 2022-01-31 DIAGNOSIS — F10920 Alcohol use, unspecified with intoxication, uncomplicated: Secondary | ICD-10-CM | POA: Insufficient documentation

## 2022-01-31 DIAGNOSIS — S0990XA Unspecified injury of head, initial encounter: Secondary | ICD-10-CM | POA: Diagnosis present

## 2022-01-31 DIAGNOSIS — X58XXXA Exposure to other specified factors, initial encounter: Secondary | ICD-10-CM | POA: Diagnosis not present

## 2022-01-31 DIAGNOSIS — S0081XA Abrasion of other part of head, initial encounter: Secondary | ICD-10-CM | POA: Insufficient documentation

## 2022-01-31 LAB — CBC WITH DIFFERENTIAL/PLATELET
Abs Immature Granulocytes: 0.03 10*3/uL (ref 0.00–0.07)
Basophils Absolute: 0.1 10*3/uL (ref 0.0–0.1)
Basophils Relative: 1 %
Eosinophils Absolute: 0.1 10*3/uL (ref 0.0–0.5)
Eosinophils Relative: 2 %
HCT: 42.9 % (ref 39.0–52.0)
Hemoglobin: 14.6 g/dL (ref 13.0–17.0)
Immature Granulocytes: 1 %
Lymphocytes Relative: 45 %
Lymphs Abs: 2.7 10*3/uL (ref 0.7–4.0)
MCH: 31.7 pg (ref 26.0–34.0)
MCHC: 34 g/dL (ref 30.0–36.0)
MCV: 93.1 fL (ref 80.0–100.0)
Monocytes Absolute: 0.9 10*3/uL (ref 0.1–1.0)
Monocytes Relative: 15 %
Neutro Abs: 2.1 10*3/uL (ref 1.7–7.7)
Neutrophils Relative %: 36 %
Platelets: 206 10*3/uL (ref 150–400)
RBC: 4.61 MIL/uL (ref 4.22–5.81)
RDW: 13.4 % (ref 11.5–15.5)
WBC: 5.9 10*3/uL (ref 4.0–10.5)
nRBC: 0 % (ref 0.0–0.2)

## 2022-01-31 LAB — BASIC METABOLIC PANEL
Anion gap: 9 (ref 5–15)
BUN: 15 mg/dL (ref 6–20)
CO2: 26 mmol/L (ref 22–32)
Calcium: 8.8 mg/dL — ABNORMAL LOW (ref 8.9–10.3)
Chloride: 107 mmol/L (ref 98–111)
Creatinine, Ser: 0.87 mg/dL (ref 0.61–1.24)
GFR, Estimated: >60 mL/min (ref >60)
Glucose, Bld: 108 mg/dL — ABNORMAL HIGH (ref 70–99)
Potassium: 3.5 mmol/L (ref 3.5–5.1)
Sodium: 142 mmol/L (ref 135–145)
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
## 2022-01-31 NOTE — ED Notes (Signed)
Patient currently sleeping, in NAD.  Respirations even and unlabored.  Side rails up x2.  ?
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
## 2022-02-07 ENCOUNTER — Emergency Department (HOSPITAL_COMMUNITY)
Admission: EM | Admit: 2022-02-07 | Discharge: 2022-02-07 | Disposition: A | Discharge status: Home / Self Care | Payer: 59 | Attending: Emergency Medicine | Admitting: Emergency Medicine

## 2022-02-07 ENCOUNTER — Other Ambulatory Visit: Payer: Self-pay

## 2022-02-07 ENCOUNTER — Encounter (HOSPITAL_COMMUNITY): Payer: Self-pay | Admitting: Emergency Medicine

## 2022-02-07 DIAGNOSIS — Y909 Presence of alcohol in blood, level not specified: Secondary | ICD-10-CM | POA: Insufficient documentation

## 2022-02-07 DIAGNOSIS — F10920 Alcohol use, unspecified with intoxication, uncomplicated: Secondary | ICD-10-CM

## 2022-02-07 DIAGNOSIS — R4781 Slurred speech: Secondary | ICD-10-CM | POA: Diagnosis not present

## 2022-02-07 DIAGNOSIS — F10120 Alcohol abuse with intoxication, uncomplicated: Secondary | ICD-10-CM | POA: Insufficient documentation

## 2022-02-07 MED ORDER — LIDOCAINE VISCOUS HCL 2 % MT SOLN
15.0000 mL | Freq: Once | OROMUCOSAL | Status: DC
Start: 2022-02-07 — End: 2022-02-07
  Filled 2022-02-07: qty 15

## 2022-02-07 NOTE — ED Notes (Signed)
Patient verbalizes understanding of discharge instructions. Opportunity for questioning and answers were provided. Armband removed by staff, pt discharged from ED to home. States he has a phone and plans to call his kids to pick him up.
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
## 2022-02-07 NOTE — ED Notes (Signed)
Ambulates independently in hall.

## 2022-02-07 NOTE — ED Notes (Signed)
Pt is oriented to self, month, and place. Still providing bizarre answers that suggest he is still intoxicated.

## 2022-02-28 ENCOUNTER — Other Ambulatory Visit: Payer: Self-pay

## 2022-02-28 ENCOUNTER — Emergency Department (HOSPITAL_COMMUNITY)
Admission: EM | Admit: 2022-02-28 | Discharge: 2022-02-28 | Disposition: A | Discharge status: Home / Self Care | Payer: 59 | Attending: Emergency Medicine | Admitting: Emergency Medicine

## 2022-02-28 ENCOUNTER — Emergency Department (HOSPITAL_COMMUNITY): Payer: 59

## 2022-02-28 ENCOUNTER — Encounter (HOSPITAL_COMMUNITY): Payer: Self-pay | Admitting: Emergency Medicine

## 2022-02-28 DIAGNOSIS — M62830 Muscle spasm of back: Secondary | ICD-10-CM | POA: Insufficient documentation

## 2022-02-28 DIAGNOSIS — M545 Low back pain, unspecified: Secondary | ICD-10-CM | POA: Diagnosis present

## 2022-02-28 LAB — COMPREHENSIVE METABOLIC PANEL
ALT: 134 U/L — ABNORMAL HIGH (ref 0–44)
AST: 92 U/L — ABNORMAL HIGH (ref 15–41)
Albumin: 4.4 g/dL (ref 3.5–5.0)
Alkaline Phosphatase: 84 U/L (ref 38–126)
Anion gap: 7 (ref 5–15)
BUN: 11 mg/dL (ref 6–20)
CO2: 25 mmol/L (ref 22–32)
Calcium: 8.8 mg/dL — ABNORMAL LOW (ref 8.9–10.3)
Chloride: 109 mmol/L (ref 98–111)
Creatinine, Ser: 0.67 mg/dL (ref 0.61–1.24)
GFR, Estimated: >60 mL/min (ref >60)
Glucose, Bld: 100 mg/dL — ABNORMAL HIGH (ref 70–99)
Potassium: 4.2 mmol/L (ref 3.5–5.1)
Sodium: 141 mmol/L (ref 135–145)
Total Bilirubin: 0.4 mg/dL (ref 0.3–1.2)
Total Protein: 8.4 g/dL — ABNORMAL HIGH (ref 6.5–8.1)

## 2022-02-28 LAB — CBC WITH DIFFERENTIAL/PLATELET
Abs Immature Granulocytes: 0.03 10*3/uL (ref 0.00–0.07)
Basophils Absolute: 0.1 10*3/uL (ref 0.0–0.1)
Basophils Relative: 1 %
Eosinophils Absolute: 0 10*3/uL (ref 0.0–0.5)
Eosinophils Relative: 0 %
HCT: 42.9 % (ref 39.0–52.0)
Hemoglobin: 14.3 g/dL (ref 13.0–17.0)
Immature Granulocytes: 0 %
Lymphocytes Relative: 29 %
Lymphs Abs: 3.1 10*3/uL (ref 0.7–4.0)
MCH: 31.3 pg (ref 26.0–34.0)
MCHC: 33.3 g/dL (ref 30.0–36.0)
MCV: 93.9 fL (ref 80.0–100.0)
Monocytes Absolute: 0.9 10*3/uL (ref 0.1–1.0)
Monocytes Relative: 8 %
Neutro Abs: 6.8 10*3/uL (ref 1.7–7.7)
Neutrophils Relative %: 62 %
Platelets: 203 10*3/uL (ref 150–400)
RBC: 4.57 MIL/uL (ref 4.22–5.81)
RDW: 13.4 % (ref 11.5–15.5)
WBC: 10.9 10*3/uL — ABNORMAL HIGH (ref 4.0–10.5)
nRBC: 0 % (ref 0.0–0.2)

## 2022-02-28 LAB — URINALYSIS, ROUTINE W REFLEX MICROSCOPIC
Bilirubin Urine: NEGATIVE
Glucose, UA: NEGATIVE mg/dL
Hgb urine dipstick: NEGATIVE
Ketones, ur: NEGATIVE mg/dL
Leukocytes,Ua: NEGATIVE
Nitrite: NEGATIVE
Protein, ur: NEGATIVE mg/dL
Specific Gravity, Urine: 1.026 (ref 1.005–1.030)
pH: 5 (ref 5.0–8.0)

## 2022-02-28 LAB — LIPASE, BLOOD: Lipase: 24 U/L (ref 11–51)

## 2022-02-28 MED ORDER — KETOROLAC TROMETHAMINE 30 MG/ML IJ SOLN
30.0000 mg | Freq: Once | INTRAMUSCULAR | Status: AC
  Administered 2022-02-28: 30 mg via INTRAMUSCULAR
  Filled 2022-02-28: qty 1

## 2022-02-28 MED ORDER — METHOCARBAMOL 500 MG PO TABS: 500.0000 mg | ORAL_TABLET | Freq: Two times a day (BID) | ORAL | 0 refills | Status: DC

## 2022-02-28 MED ORDER — LIDOCAINE 5 % EX PTCH
1.0000 | MEDICATED_PATCH | CUTANEOUS | Status: DC
  Administered 2022-02-28: 1 via TRANSDERMAL
  Filled 2022-02-28: qty 1

## 2022-02-28 MED ORDER — METHOCARBAMOL 500 MG PO TABS
500.0000 mg | ORAL_TABLET | Freq: Once | ORAL | Status: AC
Start: 2022-02-28 — End: 2022-02-28
  Administered 2022-02-28: 500 mg via ORAL
  Filled 2022-02-28: qty 1

## 2022-02-28 NOTE — ED Triage Notes (Signed)
Pt reports lower back pain since last night. Pain 7/10.
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
## 2022-02-28 NOTE — Discharge Instructions (Addendum)
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  The foraminal narrowing is located where the nerves exit the spine.  This can be causing a lot of your lower back pain and symptoms.  This does not need surgical intervention and less the symptoms become so severe that you are unable to continue a normal healthy or happy lifestyle.  I have attached a neurosurgeon in case you want to seek further treatment options.  The muscle relaxer Robaxin was sent to your pharmacy and should be covered!

## 2022-02-28 NOTE — ED Provider Notes (Signed)
[place] DEPT Provider Note   CSN: [id number] Arrival date & time: 02/28/22  [id number]     History  Chief Complaint  Patient presents with   Back Pain    Lucas Williams is a 46 y.o. male.  Patient is a 46 year old male presenting for complaints of acute on chronic lower back pain.  Patient admits to bilateral lower back pain that started last night with increased pain severity waking this morning, no radiation, 8 out of 10 pain severity.  Denies any bowel or urinary incontinence, sensation or motor deficits, numbness or tingling to the upper inner thighs, or fevers.  Denies any history of spinal injections.  Admits to worsening pain with movement.  Denies spinal tenderness.  The history is provided by the patient. No language interpreter was used.  Back Pain Associated symptoms: no abdominal pain, no chest pain, no dysuria and no fever        Home Medications Prior to Admission medications   Medication Sig Start Date End Date Taking? Authorizing Provider  methocarbamol (ROBAXIN) 500 MG tablet Take 1 tablet (500 mg total) by mouth 2 (two) times daily. [id number]  Yes Campbell Stall P, DO  cephALEXin (KEFLEX) 500 MG capsule Take 1 capsule (500 mg total) by mouth 4 (four) times daily. 01/18/22   Valarie Merino, MD  ibuprofen (ADVIL) 200 MG tablet Take 600-800 mg by mouth 4 (four) times daily.    [provider]  levETIRAcetam (KEPPRA) 750 MG tablet Take 1 tablet (750 mg total) by mouth 2 (two) times daily. Please call and make overdue appt for further refills 01/12/22 03/13/22  Regan Lemming, MD  levETIRAcetam (KEPPRA) 750 MG tablet Take 1 tablet (750 mg total) by mouth 2 (two) times daily. 01/18/22   Valarie Merino, MD  Multiple Vitamin (MULTIVITAMIN ADULT) TABS Take 1 tablet by mouth daily.    [provider]  nicotine (NICODERM CQ - DOSED IN MG/24 HR) 7 mg/24hr patch Place 1 patch (7 mg total) onto the skin daily. Patient not taking:  Reported on 01/18/2022 12/20/21   Nita Sells, MD      Allergies    Patient has no known allergies.    Review of Systems   Review of Systems  Constitutional:  Negative for chills and fever.  HENT:  Negative for ear pain and sore throat.   Eyes:  Negative for pain and visual disturbance.  Respiratory:  Negative for cough and shortness of breath.   Cardiovascular:  Negative for chest pain and palpitations.  Gastrointestinal:  Negative for abdominal pain and vomiting.  Genitourinary:  Negative for dysuria and hematuria.  Musculoskeletal:  Positive for back pain. Negative for arthralgias.  Skin:  Negative for color change and rash.  Neurological:  Negative for seizures and syncope.  All other systems reviewed and are negative.   Physical Exam Updated Vital Signs BP (!) 167/113 (BP Location: Left Arm)   Pulse 85   Temp 97.7 F (36.5 C) (Oral)   Resp 18   SpO2 100%  Physical Exam Vitals and nursing note reviewed.  Constitutional:      General: He is not in acute distress.    Appearance: He is well-developed.  HENT:     Head: Normocephalic and atraumatic.  Eyes:     Conjunctiva/sclera: Conjunctivae normal.  Cardiovascular:     Rate and Rhythm: Normal rate and regular rhythm.     Heart sounds: No murmur heard. Pulmonary:     Effort: Pulmonary effort is  normal. No respiratory distress.     Breath sounds: Normal breath sounds.  Abdominal:     Palpations: Abdomen is soft.     Tenderness: There is no abdominal tenderness.  Musculoskeletal:        General: No swelling.     Cervical back: Neck supple. No tenderness or bony tenderness.     Thoracic back: No tenderness or bony tenderness.     Lumbar back: Spasms and tenderness present. No bony tenderness.  Skin:    General: Skin is warm and dry.     Capillary Refill: Capillary refill takes less than 2 seconds.  Neurological:     General: No focal deficit present.     Mental Status: He is alert and oriented to person,  place, and time.     GCS: GCS eye subscore is 4. GCS verbal subscore is 5. GCS motor subscore is 6.     Cranial Nerves: Cranial nerves 2-12 are intact.     Sensory: Sensation is intact.     Motor: Motor function is intact.     Coordination: Coordination is intact.     Gait: Gait is intact.  Psychiatric:        Mood and Affect: Mood normal.     ED Results / Procedures / Treatments   Labs (all labs ordered are listed, but only abnormal results are displayed) Labs Reviewed  CBC WITH DIFFERENTIAL/PLATELET - Abnormal; Notable for the following components:      Result Value   WBC 10.9 (*)    All other components within normal limits  COMPREHENSIVE METABOLIC PANEL - Abnormal; Notable for the following components:   Glucose, Bld 100 (*)    Calcium 8.8 (*)    Total Protein 8.4 (*)    AST 92 (*)    ALT 134 (*)    All other components within normal limits  LIPASE, BLOOD  URINALYSIS, ROUTINE W REFLEX MICROSCOPIC    EKG None  Radiology CT Lumbar Spine Wo Contrast  Result Date: 02/28/2022 CLINICAL DATA:  Low back pain, trauma. EXAM: CT LUMBAR SPINE WITHOUT CONTRAST TECHNIQUE: Multidetector CT imaging of the lumbar spine was performed without intravenous contrast administration. Multiplanar CT image reconstructions were also generated. RADIATION DOSE REDUCTION: This exam was performed according to the departmental dose-optimization program which includes automated exposure control, adjustment of the mA and/or kV according to patient size and/or use of iterative reconstruction technique. COMPARISON:  None Available. FINDINGS: Segmentation: 5 lumbar type vertebrae Alignment: Normal. Vertebrae: No acute fracture or focal pathologic process. Paraspinal and other soft tissues: Negative for perispinal mass or inflammation Disc levels: Disc space narrowing and ridging especially at L3-4 and below. Right preferential disc bulging and endplate spurring causes moderate right foraminal narrowing at L3-4  and L4-5. Moderate left foraminal narrowing at L5-S1 due to disc height loss and endplate spurring. IMPRESSION: 1. No acute finding. 2. 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 Electronically Signed   By: Jorje Guild M.D.   On: 02/28/2022 09:19    Procedures Procedures    Medications Ordered in ED Medications  lidocaine (LIDODERM) 5 % 1 patch (1 patch Transdermal Patch Applied 02/28/22 0844)  methocarbamol (ROBAXIN) tablet 500 mg (500 mg Oral Given 02/28/22 0844)  ketorolac (TORADOL) 30 MG/ML injection 30 mg (30 mg Intramuscular Given 02/28/22 0844)    ED Course/ Medical Decision Making/ A&P  Medical Decision Making Amount and/or Complexity of Data Reviewed Labs: ordered. Radiology: ordered.  Risk Prescription drug management.   40:36 AM 46 year old male presenting for complaints of acute on chronic lower back pain.  Patient is alert and oriented x3, no acute distress, afebrile, stable vital signs.  Physical exam demonstrates bilateral lumbar paraspinal tenderness.  Midline spinal tenderness.  Lumbar muscle spasm present on the left.  Muscle relaxers given in ED.  No prior imaging.    No signs or symptoms of cauda equina syndrome.  Patient neurovascularly intact.   Patient admits to previous history of fall down 3 flights of stairs after having a seizure several years ago.  Admits to intermittent spinal tenderness.  CAT scan ordered to rule out disc disease.  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  The foraminal narrowing is located where the nerves exit the spine.  This can be causing a lot of your lower back pain and symptoms.  This does not need surgical intervention and less the symptoms become so severe that you are unable to  continue a normal healthy or happy lifestyle.  I have attached a neurosurgeon in case you want to seek further treatment options.  The muscle relaxer Robaxin was sent to your pharmacy and should be covered!         Final Clinical Impression(s) / ED Diagnoses Final diagnoses:  Acute bilateral low back pain without sciatica    Rx / DC Orders ED Discharge Orders          Ordered    methocarbamol (ROBAXIN) 500 MG tablet  2 times daily        02/28/22 1001              Lianne Cure, DO [id number]

## 2022-03-02 ENCOUNTER — Encounter (HOSPITAL_COMMUNITY): Payer: Self-pay

## 2022-03-02 ENCOUNTER — Emergency Department (HOSPITAL_COMMUNITY)
Admission: EM | Admit: 2022-03-02 | Discharge: 2022-03-02 | Disposition: A | Discharge status: Home / Self Care | Payer: 59 | Attending: Emergency Medicine | Admitting: Emergency Medicine

## 2022-03-02 DIAGNOSIS — M545 Low back pain, unspecified: Secondary | ICD-10-CM | POA: Diagnosis present

## 2022-03-02 DIAGNOSIS — Z4802 Encounter for removal of sutures: Secondary | ICD-10-CM | POA: Insufficient documentation

## 2022-03-02 DIAGNOSIS — F10920 Alcohol use, unspecified with intoxication, uncomplicated: Secondary | ICD-10-CM | POA: Insufficient documentation

## 2022-03-02 DIAGNOSIS — Z789 Other specified health status: Secondary | ICD-10-CM

## 2022-03-02 MED ORDER — KETOROLAC TROMETHAMINE 30 MG/ML IJ SOLN
30.0000 mg | Freq: Once | INTRAMUSCULAR | Status: AC
Start: 2022-03-02 — End: 2022-03-02
  Administered 2022-03-02: 30 mg via INTRAMUSCULAR
  Filled 2022-03-02: qty 1

## 2022-03-02 MED ORDER — ETODOLAC 300 MG PO CAPS: 300.0000 mg | ORAL_CAPSULE | Freq: Three times a day (TID) | ORAL | 0 refills | Status: DC

## 2022-03-02 NOTE — Discharge Instructions (Addendum)
Take the medications as needed for pain.  Follow up with a primary care doctor or back doctor.  You can also use over the counter lidocaine patches to help with your pain.

## 2022-03-02 NOTE — ED Notes (Signed)
Pt given meal and bus pass. Pt ambulatory with no assistance out of hospital. Pt verbalized discharge instructions.

## 2022-03-02 NOTE — ED Notes (Signed)
Pt noted to be obviously intoxicated. Pt states he only drank one mikes hard lemonade to ease his back pain. Pt given urinal and made aware to not get up without this RN to decrease falls.

## 2022-03-02 NOTE — ED Triage Notes (Signed)
Per EMS- Patient was at [place]. Patient called EMS with c/o lower back pain. Patient has alcohol on board. Patient was seen on 02/28/22 for the same.

## 2022-03-02 NOTE — ED Provider Notes (Signed)
[facility]-EMERGENCY DEPT Provider Note   CSN: [id number] Arrival date & time: 03/02/22  0840     History  Chief Complaint  Patient presents with   Back Pain   Alcohol Intoxication    Lucas Williams is a 46 y.o. male.   Back Pain Associated symptoms: no fever   Alcohol Intoxication    Patient presents to the ED with complaints of lower back pain.  Patient was recently seen in the emergency room on June 11 with similar complaints.  Patient denies any recent falls or injuries.  He has been having pain in his lower back.  It does not radiate down his legs.  He is not having any numbness or weakness.  Patient was given prescriptions for medications but does not feel like it is helping.  Patient does admit to drinking alcohol this morning Patient also states he has some stitches on the left side of his face that were placed about a month ago.  He was told they were absorbable.  Patient states he still notices them. Home Medications Prior to Admission medications   Medication Sig Start Date End Date Taking? Authorizing Provider  etodolac (LODINE) 300 MG capsule Take 1 capsule (300 mg total) by mouth every 8 (eight) hours. 03/02/22  Yes Linwood Dibbles, MD  cephALEXin (KEFLEX) 500 MG capsule Take 1 capsule (500 mg total) by mouth 4 (four) times daily. 01/18/22   Wynetta Fines, MD  levETIRAcetam (KEPPRA) 750 MG tablet Take 1 tablet (750 mg total) by mouth 2 (two) times daily. Please call and make overdue appt for further refills 01/12/22 03/13/22  Ernie Avena, MD  levETIRAcetam (KEPPRA) 750 MG tablet Take 1 tablet (750 mg total) by mouth 2 (two) times daily. 01/18/22   Wynetta Fines, MD  methocarbamol (ROBAXIN) 500 MG tablet Take 1 tablet (500 mg total) by mouth 2 (two) times daily. 02/28/22   Franne Forts, DO  Multiple Vitamin (MULTIVITAMIN ADULT) TABS Take 1 tablet by mouth daily.    [provider]  nicotine (NICODERM CQ - DOSED IN MG/24 HR) 7 mg/24hr patch  Place 1 patch (7 mg total) onto the skin daily. Patient not taking: Reported on 01/18/2022 12/20/21   Rhetta Mura, MD      Allergies    Patient has no known allergies.    Review of Systems   Review of Systems  Constitutional:  Negative for fever.  Musculoskeletal:  Positive for back pain.    Physical Exam Updated Vital Signs BP (!) 126/96 (BP Location: Left Arm)   Pulse 86   Temp 98.3 F (36.8 C) (Oral)   Resp 18   SpO2 94%  Physical Exam Vitals and nursing note reviewed.  Constitutional:      Appearance: He is well-developed. He is not diaphoretic.  HENT:     Head: Normocephalic and atraumatic.     Comments: Well-healed scar left cheek, 2 sutures still noted    Right Ear: External ear normal.     Left Ear: External ear normal.  Eyes:     General: No scleral icterus.       Right eye: No discharge.        Left eye: No discharge.     Conjunctiva/sclera: Conjunctivae normal.  Neck:     Trachea: No tracheal deviation.  Cardiovascular:     Rate and Rhythm: Normal rate.  Pulmonary:     Effort: Pulmonary effort is normal. No respiratory distress.     Breath sounds:  No stridor.  Abdominal:     General: There is no distension.  Musculoskeletal:        General: Tenderness present. No swelling or deformity.     Cervical back: Neck supple.     Lumbar back: Tenderness present.  Skin:    General: Skin is warm and dry.     Findings: No rash.  Neurological:     Mental Status: He is alert.     Cranial Nerves: Cranial nerve deficit: no gross deficits.     ED Results / Procedures / Treatments   Labs (all labs ordered are listed, but only abnormal results are displayed) Labs Reviewed - No data to display  EKG None  Radiology No results found.  Procedures Procedures   2 sutures removed without difficulty Medications Ordered in ED Medications  ketorolac (TORADOL) 30 MG/ML injection 30 mg (has no administration in time range)    ED Course/ Medical Decision  Making/ A&P Clinical Course as of 03/02/22 0921  Tue Mar 02, 2022  0918 Low back pain [JK]    Clinical Course User Index [JK] Linwood Dibbles, MD                           Medical Decision Making Problems Addressed: Acute midline low back pain without sciatica: complicated acute illness or injury Alcohol use: chronic illness or injury Visit for suture removal: self-limited or minor problem  Amount and/or Complexity of Data Reviewed External Data Reviewed: radiology and notes.    Details: Reviewed notes from recent visit.  Patient did have a CT scan of the spine.  No fracture or signs of infection noted  Risk Prescription drug management.   Patient presents with persistent low back pain.  He is afebrile.  No signs of any acute neurovascular deficits.  We will continue with symptomatic treatment.  Outpatient follow-up.  Sutures removed without difficulty.  Patient does admit to alcohol use but no signs of any acute complications associated with that at this time  Evaluation and diagnostic testing in the emergency department does not suggest an emergent condition requiring admission or immediate intervention beyond what has been performed at this time.  The patient is safe for discharge and has been instructed to return immediately for worsening symptoms, change in symptoms or any other concerns.         Final Clinical Impression(s) / ED Diagnoses Final diagnoses:  Acute midline low back pain without sciatica  Visit for suture removal  Alcohol use    Rx / DC Orders ED Discharge Orders          Ordered    etodolac (LODINE) 300 MG capsule  Every 8 hours       Note to Pharmacy: As needed for pain   03/02/22 0921              Linwood Dibbles, MD 03/02/22 [phone]

## 2022-03-09 ENCOUNTER — Emergency Department (HOSPITAL_COMMUNITY)
Admission: EM | Admit: 2022-03-09 | Discharge: 2022-03-09 | Discharge status: Against Medical Advice | Payer: 59 | Attending: Emergency Medicine | Admitting: Emergency Medicine

## 2022-03-09 ENCOUNTER — Encounter (HOSPITAL_COMMUNITY): Payer: Self-pay

## 2022-03-09 DIAGNOSIS — Z5321 Procedure and treatment not carried out due to patient leaving prior to being seen by health care provider: Secondary | ICD-10-CM | POA: Diagnosis not present

## 2022-03-09 DIAGNOSIS — M545 Low back pain, unspecified: Secondary | ICD-10-CM | POA: Diagnosis not present

## 2022-03-09 DIAGNOSIS — W108XXA Fall (on) (from) other stairs and steps, initial encounter: Secondary | ICD-10-CM | POA: Diagnosis not present

## 2022-03-09 NOTE — ED Provider Triage Note (Signed)
Emergency Medicine Provider Triage Evaluation Note  Lucas Williams , a 46 y.o. male  was evaluated in triage.  Pt complains of low back pain. History of same, states that he fell down 3 flights of stairs in 2002 and has had pain intermittently since then. Was just seen on 6/11 and had CT imaging that showed  1. No acute finding. 2. 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  He was discharged with Robaxin and neurosurgery referral, states he has taken the Robaxin without relief and tried to call neurosurgery but has not heard back.  States that his pain is persistent but unchanged from when he was here previously.  Denies any sharp shooting pain down his legs, numbness/tingling in his extremities, difficulty walking, loss of bowel or bladder function, or saddle paresthesias.  Patient states that he has been drinking alcohol all day to help with the pain.  Review of Systems  Positive:  Negative:   Physical Exam  BP (!) 140/114 (BP Location: Left Arm)   Pulse 86   Temp 98.5 F (36.9 C) (Oral)   Resp 16   SpO2 97%  Gen:   Awake, no distress   Resp:  Normal effort  MSK:   Moves extremities without difficulty  Other:  Patient ambulatory with normal gait  Medical Decision Making  Medically screening exam initiated at 7:00 PM.  Appropriate orders placed.  Shawnn Bouillon was informed that the remainder of the evaluation will be completed by another provider, this initial triage assessment does not replace that evaluation, and the importance of remaining in the ED until their evaluation is complete.     Vear Clock 03/09/22 1904

## 2022-03-09 NOTE — ED Notes (Signed)
Pt states that he is leaving. Pt is calling his ride to pick him up.
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
## 2022-03-10 ENCOUNTER — Emergency Department (HOSPITAL_COMMUNITY)
Admission: EM | Admit: 2022-03-10 | Discharge: 2022-03-10 | Disposition: A | Discharge status: Home / Self Care | Payer: 59 | Source: Home / Self Care | Attending: Emergency Medicine | Admitting: Emergency Medicine

## 2022-03-10 ENCOUNTER — Encounter (HOSPITAL_COMMUNITY): Payer: Self-pay | Admitting: Emergency Medicine

## 2022-03-10 DIAGNOSIS — M545 Low back pain, unspecified: Secondary | ICD-10-CM | POA: Insufficient documentation

## 2022-03-10 DIAGNOSIS — F10939 Alcohol use, unspecified with withdrawal, unspecified: Secondary | ICD-10-CM | POA: Diagnosis not present

## 2022-03-10 DIAGNOSIS — F10139 Alcohol abuse with withdrawal, unspecified: Secondary | ICD-10-CM | POA: Diagnosis not present

## 2022-03-10 NOTE — ED Provider Notes (Signed)
[facility] EMERGENCY DEPARTMENT Provider Note   CSN: [id number] Arrival date & time: 03/10/22  1136     History  No chief complaint on file.   Lucas Williams is a 46 y.o. male.  The history is provided by the patient and medical records. No language interpreter was used.    46 year old male with history of polysubstance use including alcohol use, alcohol-related seizures, TBI, depression, presenting complaining of back pain.  Patient reports he works at [employer] and does heavy lifting.  For the past several weeks he has been having recurrent pain to his lower back.  Described pain as sharp stabbing sensation to his lower back worse with movement.  He has been seen and evaluated in the ED several times with this and was given medication that he has been taking with some improvement.  He was given a referral to neurosurgeon and he did attempt to go see them today but states that he needed a referral.  He is here requesting for a referral note.  He does not endorse any fever no bowel bladder incontinence or saddle anesthesia.  He is able to ambulate.  Denies history of IV drug use active cancer.  Home Medications Prior to Admission medications   Medication Sig Start Date End Date Taking? Authorizing Provider  cephALEXin (KEFLEX) 500 MG capsule Take 1 capsule (500 mg total) by mouth 4 (four) times daily. 01/18/22   Wynetta Fines, MD  etodolac (LODINE) 300 MG capsule Take 1 capsule (300 mg total) by mouth every 8 (eight) hours. 03/02/22   Linwood Dibbles, MD  levETIRAcetam (KEPPRA) 750 MG tablet Take 1 tablet (750 mg total) by mouth 2 (two) times daily. Please call and make overdue appt for further refills 01/12/22 03/13/22  Ernie Avena, MD  levETIRAcetam (KEPPRA) 750 MG tablet Take 1 tablet (750 mg total) by mouth 2 (two) times daily. 01/18/22   Wynetta Fines, MD  methocarbamol (ROBAXIN) 500 MG tablet Take 1 tablet (500 mg total) by mouth 2 (two) times daily. 02/28/22   Franne Forts, DO  Multiple Vitamin (MULTIVITAMIN ADULT) TABS Take 1 tablet by mouth daily.    [provider]  nicotine (NICODERM CQ - DOSED IN MG/24 HR) 7 mg/24hr patch Place 1 patch (7 mg total) onto the skin daily. Patient not taking: Reported on 01/18/2022 12/20/21   Rhetta Mura, MD      Allergies    Patient has no known allergies.    Review of Systems   Review of Systems  All other systems reviewed and are negative.   Physical Exam Updated Vital Signs BP (!) 147/131   Pulse 80   Temp 98.2 F (36.8 C) (Oral)   Resp (!) 21   SpO2 99%  Physical Exam Vitals and nursing note reviewed.  Constitutional:      General: He is not in acute distress.    Appearance: He is well-developed.  HENT:     Head: Atraumatic.  Eyes:     Conjunctiva/sclera: Conjunctivae normal.  Abdominal:     Tenderness: There is no abdominal tenderness.  Musculoskeletal:        General: Tenderness (Mild tenderness noted to lumbar and paralumbar spinal muscle bilaterally worsening with back flexion but no overlying skin changes.  Negative straight leg raise, 5 out of 5 strength to bilateral lower extremities with intact distal pulses.) present.     Cervical back: Neck supple.  Skin:    Findings: No rash.  Neurological:  Mental Status: He is alert.     Comments: Able to ambulate.     ED Results / Procedures / Treatments   Labs (all labs ordered are listed, but only abnormal results are displayed) Labs Reviewed - No data to display  EKG None  Radiology No results found.  Procedures Procedures    Medications Ordered in ED Medications - No data to display  ED Course/ Medical Decision Making/ A&P                           Medical Decision Making  BP (!) 147/131   Pulse 80   Temp 98.2 F (36.8 C) (Oral)   Resp (!) 21   SpO2 99%   This is a 46 year old male significant history of polysubstance use including alcohol use, chronic back pain, who has been seen and evaluated for  lower back pain for the past several visits within the past 2 weeks.  He is here with a request to have a referral to neurosurgery for further assessments as he went to the office today but was told that he needed a referral.  He is able to ambulate, he does not have any infectious symptoms.  No red flags, doubt cauda equina or osteomyelitis or spinal abscess.  I have reviewed previous ER visits as well as previous lumbar spine CT was done on 02/28/2022 which shows degenerative disc disease which may account for his low back pain.  I have consider reimaging his L-spine but no recent injury to suggest new fracture.  At this time, we will give patient referral back to [place] [facility] specialist for outpatient management of his condition.  Work note provided per request.        Final Clinical Impression(s) / ED Diagnoses Final diagnoses:  Acute bilateral low back pain without sciatica    Rx / DC Orders ED Discharge Orders     None         Fayrene Helper, PA-C 03/10/22 1602    Glynn Octave, MD 03/10/22 1826

## 2022-03-10 NOTE — ED Triage Notes (Signed)
Pt back to the ED again for the second time today , pt is c/o low back pain and etoh

## 2022-03-10 NOTE — Discharge Instructions (Addendum)
You have been evaluated for your low back pain.  Please call and follow-up closely with a spinal specialist for further managements of your condition.

## 2022-03-10 NOTE — ED Notes (Signed)
Pt stated he had been waiting for 8hrs. Advised pt he had only been here for 2 hours pt turned around threw bp cuff in the trash and was seen walking out the door

## 2022-03-13 ENCOUNTER — Encounter (HOSPITAL_COMMUNITY): Payer: Self-pay

## 2022-03-13 ENCOUNTER — Emergency Department (HOSPITAL_COMMUNITY): Payer: 59

## 2022-03-13 ENCOUNTER — Other Ambulatory Visit: Payer: Self-pay

## 2022-03-13 ENCOUNTER — Inpatient Hospital Stay (HOSPITAL_COMMUNITY)
Admission: EM | Admit: 2022-03-13 | Discharge: 2022-03-17 | DRG: 896 | Disposition: A | Discharge status: Home / Self Care | Payer: 59 | Attending: Student | Admitting: Student

## 2022-03-13 DIAGNOSIS — F10139 Alcohol abuse with withdrawal, unspecified: Secondary | ICD-10-CM | POA: Diagnosis present

## 2022-03-13 DIAGNOSIS — B009 Herpesviral infection, unspecified: Secondary | ICD-10-CM | POA: Diagnosis present

## 2022-03-13 DIAGNOSIS — K148 Other diseases of tongue: Secondary | ICD-10-CM | POA: Diagnosis not present

## 2022-03-13 DIAGNOSIS — K7030 Alcoholic cirrhosis of liver without ascites: Secondary | ICD-10-CM | POA: Diagnosis present

## 2022-03-13 DIAGNOSIS — B1920 Unspecified viral hepatitis C without hepatic coma: Secondary | ICD-10-CM | POA: Diagnosis present

## 2022-03-13 DIAGNOSIS — F1010 Alcohol abuse, uncomplicated: Secondary | ICD-10-CM | POA: Diagnosis not present

## 2022-03-13 DIAGNOSIS — E860 Dehydration: Secondary | ICD-10-CM | POA: Diagnosis present

## 2022-03-13 DIAGNOSIS — D696 Thrombocytopenia, unspecified: Secondary | ICD-10-CM

## 2022-03-13 DIAGNOSIS — G8929 Other chronic pain: Secondary | ICD-10-CM | POA: Diagnosis present

## 2022-03-13 DIAGNOSIS — R001 Bradycardia, unspecified: Secondary | ICD-10-CM | POA: Diagnosis present

## 2022-03-13 DIAGNOSIS — Z79899 Other long term (current) drug therapy: Secondary | ICD-10-CM | POA: Diagnosis not present

## 2022-03-13 DIAGNOSIS — Z87820 Personal history of traumatic brain injury: Secondary | ICD-10-CM | POA: Diagnosis not present

## 2022-03-13 DIAGNOSIS — F22 Delusional disorders: Secondary | ICD-10-CM | POA: Diagnosis present

## 2022-03-13 DIAGNOSIS — F17210 Nicotine dependence, cigarettes, uncomplicated: Secondary | ICD-10-CM | POA: Diagnosis present

## 2022-03-13 DIAGNOSIS — Z91199 Patient's noncompliance with other medical treatment and regimen due to unspecified reason: Secondary | ICD-10-CM | POA: Diagnosis not present

## 2022-03-13 DIAGNOSIS — D6959 Other secondary thrombocytopenia: Secondary | ICD-10-CM | POA: Diagnosis present

## 2022-03-13 DIAGNOSIS — R748 Abnormal levels of other serum enzymes: Secondary | ICD-10-CM

## 2022-03-13 DIAGNOSIS — R569 Unspecified convulsions: Secondary | ICD-10-CM | POA: Diagnosis not present

## 2022-03-13 DIAGNOSIS — F1110 Opioid abuse, uncomplicated: Secondary | ICD-10-CM | POA: Diagnosis present

## 2022-03-13 DIAGNOSIS — F10939 Alcohol use, unspecified with withdrawal, unspecified: Secondary | ICD-10-CM | POA: Diagnosis present

## 2022-03-13 DIAGNOSIS — G40909 Epilepsy, unspecified, not intractable, without status epilepticus: Secondary | ICD-10-CM | POA: Diagnosis present

## 2022-03-13 DIAGNOSIS — I10 Essential (primary) hypertension: Secondary | ICD-10-CM | POA: Diagnosis present

## 2022-03-13 DIAGNOSIS — M545 Low back pain, unspecified: Secondary | ICD-10-CM | POA: Diagnosis present

## 2022-03-13 DIAGNOSIS — F10932 Alcohol use, unspecified with withdrawal with perceptual disturbance: Secondary | ICD-10-CM | POA: Diagnosis not present

## 2022-03-13 DIAGNOSIS — K149 Disease of tongue, unspecified: Secondary | ICD-10-CM | POA: Diagnosis present

## 2022-03-13 DIAGNOSIS — M6282 Rhabdomyolysis: Secondary | ICD-10-CM | POA: Diagnosis present

## 2022-03-13 DIAGNOSIS — Z91148 Patient's other noncompliance with medication regimen for other reason: Secondary | ICD-10-CM

## 2022-03-13 DIAGNOSIS — Z720 Tobacco use: Secondary | ICD-10-CM | POA: Diagnosis present

## 2022-03-13 DIAGNOSIS — S069XAA Unspecified intracranial injury with loss of consciousness status unknown, initial encounter: Secondary | ICD-10-CM | POA: Diagnosis present

## 2022-03-13 DIAGNOSIS — R7989 Other specified abnormal findings of blood chemistry: Secondary | ICD-10-CM | POA: Diagnosis present

## 2022-03-13 DIAGNOSIS — G9341 Metabolic encephalopathy: Secondary | ICD-10-CM | POA: Diagnosis present

## 2022-03-13 DIAGNOSIS — Z8619 Personal history of other infectious and parasitic diseases: Secondary | ICD-10-CM | POA: Diagnosis not present

## 2022-03-13 DIAGNOSIS — N179 Acute kidney failure, unspecified: Secondary | ICD-10-CM | POA: Diagnosis present

## 2022-03-13 DIAGNOSIS — S069X9A Unspecified intracranial injury with loss of consciousness of unspecified duration, initial encounter: Secondary | ICD-10-CM | POA: Diagnosis not present

## 2022-03-13 LAB — CBC WITH DIFFERENTIAL/PLATELET
Abs Immature Granulocytes: 0.02 10*3/uL (ref 0.00–0.07)
Basophils Absolute: 0 10*3/uL (ref 0.0–0.1)
Basophils Relative: 0 %
Eosinophils Absolute: 0.1 10*3/uL (ref 0.0–0.5)
Eosinophils Relative: 0 %
HCT: 40.3 % (ref 39.0–52.0)
Hemoglobin: 14.1 g/dL (ref 13.0–17.0)
Immature Granulocytes: 0 %
Lymphocytes Relative: 15 %
Lymphs Abs: 1.8 10*3/uL (ref 0.7–4.0)
MCH: 31.6 pg (ref 26.0–34.0)
MCHC: 35 g/dL (ref 30.0–36.0)
MCV: 90.4 fL (ref 80.0–100.0)
Monocytes Absolute: 1 10*3/uL (ref 0.1–1.0)
Monocytes Relative: 9 %
Neutro Abs: 8.7 10*3/uL — ABNORMAL HIGH (ref 1.7–7.7)
Neutrophils Relative %: 76 %
Platelets: 163 10*3/uL (ref 150–400)
RBC: 4.46 MIL/uL (ref 4.22–5.81)
RDW: 13.5 % (ref 11.5–15.5)
WBC: 11.6 10*3/uL — ABNORMAL HIGH (ref 4.0–10.5)
nRBC: 0 % (ref 0.0–0.2)

## 2022-03-13 LAB — ETHANOL: Alcohol, Ethyl (B): <10 mg/dL (ref <10)

## 2022-03-13 LAB — COMPREHENSIVE METABOLIC PANEL
ALT: 143 U/L — ABNORMAL HIGH (ref 0–44)
AST: 192 U/L — ABNORMAL HIGH (ref 15–41)
Albumin: 4.6 g/dL (ref 3.5–5.0)
Alkaline Phosphatase: 67 U/L (ref 38–126)
Anion gap: 14 (ref 5–15)
BUN: 55 mg/dL — ABNORMAL HIGH (ref 6–20)
CO2: 17 mmol/L — ABNORMAL LOW (ref 22–32)
Calcium: 9.3 mg/dL (ref 8.9–10.3)
Chloride: 102 mmol/L (ref 98–111)
Creatinine, Ser: 3.48 mg/dL — ABNORMAL HIGH (ref 0.61–1.24)
GFR, Estimated: 21 mL/min — ABNORMAL LOW (ref >60)
Glucose, Bld: 98 mg/dL (ref 70–99)
Potassium: 3.6 mmol/L (ref 3.5–5.1)
Sodium: 133 mmol/L — ABNORMAL LOW (ref 135–145)
Total Bilirubin: 2 mg/dL — ABNORMAL HIGH (ref 0.3–1.2)
Total Protein: 8.7 g/dL — ABNORMAL HIGH (ref 6.5–8.1)

## 2022-03-13 LAB — PHOSPHORUS: Phosphorus: 4.1 mg/dL (ref 2.5–4.6)

## 2022-03-13 LAB — RAPID URINE DRUG SCREEN, HOSP PERFORMED
Amphetamines: NOT DETECTED
Barbiturates: NOT DETECTED
Benzodiazepines: NOT DETECTED
Cocaine: NOT DETECTED
Opiates: NOT DETECTED
Tetrahydrocannabinol: NOT DETECTED

## 2022-03-13 LAB — MAGNESIUM: Magnesium: 2.7 mg/dL — ABNORMAL HIGH (ref 1.7–2.4)

## 2022-03-13 LAB — MRSA NEXT GEN BY PCR, NASAL: MRSA by PCR Next Gen: NOT DETECTED

## 2022-03-13 MED ORDER — THIAMINE HCL 100 MG/ML IJ SOLN
100.0000 mg | Freq: Every day | INTRAMUSCULAR | Status: DC
  Administered 2022-03-13 – 2022-03-15 (×3): 100 mg via INTRAVENOUS
  Filled 2022-03-13 (×3): qty 2

## 2022-03-13 MED ORDER — SODIUM CHLORIDE 0.9 % IV SOLN
1000.0000 mL | INTRAVENOUS | Status: DC
  Administered 2022-03-13 – 2022-03-14 (×4): 1000 mL via INTRAVENOUS

## 2022-03-13 MED ORDER — ONDANSETRON HCL 4 MG/2ML IJ SOLN: 4.0000 mg | Freq: Four times a day (QID) | INTRAMUSCULAR | Status: DC | PRN

## 2022-03-13 MED ORDER — ORAL CARE MOUTH RINSE: 15.0000 mL | OROMUCOSAL | Status: DC | PRN

## 2022-03-13 MED ORDER — FOLIC ACID 1 MG PO TABS
1.0000 mg | ORAL_TABLET | Freq: Every day | ORAL | Status: DC
Start: 2022-03-13 — End: 2022-03-17
  Administered 2022-03-13 – 2022-03-17 (×5): 1 mg via ORAL
  Filled 2022-03-13 (×5): qty 1

## 2022-03-13 MED ORDER — LORAZEPAM 2 MG/ML IJ SOLN
1.0000 mg | INTRAMUSCULAR | Status: AC | PRN
  Administered 2022-03-13: 2 mg via INTRAVENOUS
  Filled 2022-03-13: qty 1

## 2022-03-13 MED ORDER — ADULT MULTIVITAMIN W/MINERALS CH
1.0000 | ORAL_TABLET | Freq: Every day | ORAL | Status: DC
Start: 2022-03-13 — End: 2022-03-17
  Administered 2022-03-13 – 2022-03-17 (×5): 1 via ORAL
  Filled 2022-03-13 (×5): qty 1

## 2022-03-13 MED ORDER — LORAZEPAM 1 MG PO TABS: 1.0000 mg | ORAL_TABLET | ORAL | Status: AC | PRN

## 2022-03-13 MED ORDER — LORAZEPAM 1 MG PO TABS
0.0000 mg | ORAL_TABLET | ORAL | Status: DC
  Administered 2022-03-13: 1 mg via ORAL
  Administered 2022-03-13 (×2): 2 mg via ORAL
  Administered 2022-03-14 (×5): 1 mg via ORAL
  Filled 2022-03-13 (×3): qty 1
  Filled 2022-03-13: qty 2
  Filled 2022-03-13 (×3): qty 1
  Filled 2022-03-13: qty 2

## 2022-03-13 MED ORDER — ALBUTEROL SULFATE (2.5 MG/3ML) 0.083% IN NEBU: 2.5000 mg | INHALATION_SOLUTION | RESPIRATORY_TRACT | Status: DC | PRN

## 2022-03-13 MED ORDER — LORAZEPAM 1 MG PO TABS
1.0000 mg | ORAL_TABLET | Freq: Once | ORAL | Status: AC
Start: 2022-03-13 — End: 2022-03-13
  Administered 2022-03-13: 1 mg via ORAL
  Filled 2022-03-13: qty 1

## 2022-03-13 MED ORDER — NICOTINE 7 MG/24HR TD PT24
7.0000 mg | MEDICATED_PATCH | Freq: Every day | TRANSDERMAL | Status: DC
  Administered 2022-03-13 – 2022-03-16 (×4): 7 mg via TRANSDERMAL
  Filled 2022-03-13 (×4): qty 1

## 2022-03-13 MED ORDER — LORAZEPAM 1 MG PO TABS: 0.0000 mg | ORAL_TABLET | Freq: Three times a day (TID) | ORAL | Status: DC

## 2022-03-13 MED ORDER — ONDANSETRON HCL 4 MG PO TABS: 4.0000 mg | ORAL_TABLET | Freq: Four times a day (QID) | ORAL | Status: DC | PRN

## 2022-03-13 MED ORDER — SODIUM CHLORIDE 0.9 % IV BOLUS (SEPSIS)
1000.0000 mL | Freq: Once | INTRAVENOUS | Status: AC
  Administered 2022-03-13: 1000 mL via INTRAVENOUS

## 2022-03-13 MED ORDER — ORAL CARE MOUTH RINSE
15.0000 mL | OROMUCOSAL | Status: DC
  Administered 2022-03-13: 15 mL via OROMUCOSAL

## 2022-03-13 MED ORDER — SODIUM CHLORIDE 0.9 % IV SOLN
750.0000 mg | Freq: Once | INTRAVENOUS | Status: AC
  Administered 2022-03-13: 750 mg via INTRAVENOUS
  Filled 2022-03-13: qty 7.5

## 2022-03-13 MED ORDER — CHLORHEXIDINE GLUCONATE CLOTH 2 % EX PADS
6.0000 | MEDICATED_PAD | Freq: Every day | CUTANEOUS | Status: DC
  Administered 2022-03-13 – 2022-03-16 (×4): 6 via TOPICAL

## 2022-03-13 NOTE — ED Notes (Signed)
Pt placed in a gown, placed on CCM, all clothes placed in a pt belonging bag and at bedside. Pt resting comfortably.

## 2022-03-13 NOTE — ED Triage Notes (Addendum)
Pt arrives voluntary with [employer] after being picked up at a mechanic's shop. Pt was reportedly banging on the shop door and making bizarre statements. Pt endorses alcohol use last night. Pt states he has hx of seizures, compliant with medications but missed last night reportedly. Denies SI/HI, but [employer] reported that pt appeared to be responding to internal stimuli. Pt without any somatic complaints during triage.

## 2022-03-14 DIAGNOSIS — N179 Acute kidney failure, unspecified: Secondary | ICD-10-CM | POA: Diagnosis not present

## 2022-03-14 DIAGNOSIS — F1010 Alcohol abuse, uncomplicated: Secondary | ICD-10-CM | POA: Diagnosis not present

## 2022-03-14 DIAGNOSIS — R7989 Other specified abnormal findings of blood chemistry: Secondary | ICD-10-CM | POA: Diagnosis not present

## 2022-03-14 DIAGNOSIS — K148 Other diseases of tongue: Secondary | ICD-10-CM

## 2022-03-14 DIAGNOSIS — K7030 Alcoholic cirrhosis of liver without ascites: Secondary | ICD-10-CM

## 2022-03-14 DIAGNOSIS — Z91199 Patient's noncompliance with other medical treatment and regimen due to unspecified reason: Secondary | ICD-10-CM

## 2022-03-14 DIAGNOSIS — Z720 Tobacco use: Secondary | ICD-10-CM

## 2022-03-14 DIAGNOSIS — S069X9A Unspecified intracranial injury with loss of consciousness of unspecified duration, initial encounter: Secondary | ICD-10-CM

## 2022-03-14 DIAGNOSIS — G9341 Metabolic encephalopathy: Secondary | ICD-10-CM | POA: Diagnosis not present

## 2022-03-14 DIAGNOSIS — D696 Thrombocytopenia, unspecified: Secondary | ICD-10-CM

## 2022-03-14 DIAGNOSIS — R569 Unspecified convulsions: Secondary | ICD-10-CM

## 2022-03-14 LAB — COMPREHENSIVE METABOLIC PANEL
ALT: 116 U/L — ABNORMAL HIGH (ref 0–44)
AST: 138 U/L — ABNORMAL HIGH (ref 15–41)
Albumin: 3.5 g/dL (ref 3.5–5.0)
Alkaline Phosphatase: 57 U/L (ref 38–126)
Anion gap: 6 (ref 5–15)
BUN: 39 mg/dL — ABNORMAL HIGH (ref 6–20)
CO2: 20 mmol/L — ABNORMAL LOW (ref 22–32)
Calcium: 8.8 mg/dL — ABNORMAL LOW (ref 8.9–10.3)
Chloride: 117 mmol/L — ABNORMAL HIGH (ref 98–111)
Creatinine, Ser: 1.57 mg/dL — ABNORMAL HIGH (ref 0.61–1.24)
GFR, Estimated: 55 mL/min — ABNORMAL LOW (ref >60)
Glucose, Bld: 84 mg/dL (ref 70–99)
Potassium: 3.7 mmol/L (ref 3.5–5.1)
Sodium: 143 mmol/L (ref 135–145)
Total Bilirubin: 1.5 mg/dL — ABNORMAL HIGH (ref 0.3–1.2)
Total Protein: 6.9 g/dL (ref 6.5–8.1)

## 2022-03-14 LAB — CBC
HCT: 37.3 % — ABNORMAL LOW (ref 39.0–52.0)
Hemoglobin: 12.7 g/dL — ABNORMAL LOW (ref 13.0–17.0)
MCH: 31.9 pg (ref 26.0–34.0)
MCHC: 34 g/dL (ref 30.0–36.0)
MCV: 93.7 fL (ref 80.0–100.0)
Platelets: 129 10*3/uL — ABNORMAL LOW (ref 150–400)
RBC: 3.98 MIL/uL — ABNORMAL LOW (ref 4.22–5.81)
RDW: 13.7 % (ref 11.5–15.5)
WBC: 7 10*3/uL (ref 4.0–10.5)
nRBC: 0 % (ref 0.0–0.2)

## 2022-03-14 LAB — HEPATITIS PANEL, ACUTE
HCV Ab: REACTIVE — AB
Hep A IgM: NONREACTIVE
Hep B C IgM: NONREACTIVE
Hepatitis B Surface Ag: NONREACTIVE

## 2022-03-14 LAB — CK: Total CK: 2492 U/L — ABNORMAL HIGH (ref 49–397)

## 2022-03-14 MED ORDER — MAGIC MOUTHWASH W/LIDOCAINE
5.0000 mL | Freq: Three times a day (TID) | ORAL | Status: DC
  Administered 2022-03-14 – 2022-03-17 (×9): 5 mL via ORAL
  Filled 2022-03-14 (×11): qty 5

## 2022-03-14 MED ORDER — LEVETIRACETAM 500 MG PO TABS
750.0000 mg | ORAL_TABLET | Freq: Two times a day (BID) | ORAL | Status: DC
  Administered 2022-03-14 – 2022-03-17 (×7): 750 mg via ORAL
  Filled 2022-03-14 (×7): qty 1

## 2022-03-14 MED ORDER — LABETALOL HCL 5 MG/ML IV SOLN: 10.0000 mg | INTRAVENOUS | Status: DC | PRN

## 2022-03-14 MED ORDER — CLONIDINE HCL 0.1 MG PO TABS
0.1000 mg | ORAL_TABLET | Freq: Three times a day (TID) | ORAL | Status: DC
  Administered 2022-03-14 – 2022-03-15 (×6): 0.1 mg via ORAL
  Filled 2022-03-14 (×6): qty 1

## 2022-03-14 MED ORDER — SODIUM CHLORIDE 0.9 % IV SOLN
1000.0000 mL | INTRAVENOUS | Status: DC
  Administered 2022-03-14 – 2022-03-16 (×5): 1000 mL via INTRAVENOUS

## 2022-03-14 MED ORDER — HYDRALAZINE HCL 25 MG PO TABS
25.0000 mg | ORAL_TABLET | Freq: Four times a day (QID) | ORAL | Status: DC | PRN
  Administered 2022-03-14 – 2022-03-16 (×4): 25 mg via ORAL
  Filled 2022-03-14 (×4): qty 1

## 2022-03-15 ENCOUNTER — Inpatient Hospital Stay (HOSPITAL_COMMUNITY)
Admission: EM | Admit: 2022-03-15 | Discharge: 2022-03-15 | Disposition: A | Discharge status: Home / Self Care | Payer: 59 | Source: Home / Self Care | Attending: Student | Admitting: Student

## 2022-03-15 DIAGNOSIS — G9341 Metabolic encephalopathy: Secondary | ICD-10-CM | POA: Diagnosis not present

## 2022-03-15 DIAGNOSIS — R7989 Other specified abnormal findings of blood chemistry: Secondary | ICD-10-CM | POA: Diagnosis not present

## 2022-03-15 DIAGNOSIS — Z8619 Personal history of other infectious and parasitic diseases: Secondary | ICD-10-CM

## 2022-03-15 DIAGNOSIS — F1010 Alcohol abuse, uncomplicated: Secondary | ICD-10-CM | POA: Diagnosis not present

## 2022-03-15 DIAGNOSIS — N179 Acute kidney failure, unspecified: Secondary | ICD-10-CM | POA: Diagnosis not present

## 2022-03-15 DIAGNOSIS — M6282 Rhabdomyolysis: Secondary | ICD-10-CM

## 2022-03-15 LAB — MAGNESIUM: Magnesium: 1.9 mg/dL (ref 1.7–2.4)

## 2022-03-15 LAB — HSV(HERPES SIMPLEX VRS) I + II AB-IGG
HSV 1 Glycoprotein G Ab, IgG: 20 index — ABNORMAL HIGH (ref 0.00–0.90)
HSV 2 Glycoprotein G Ab, IgG: <0.91 index (ref 0.00–0.90)

## 2022-03-15 LAB — COMPREHENSIVE METABOLIC PANEL
ALT: 122 U/L — ABNORMAL HIGH (ref 0–44)
AST: 120 U/L — ABNORMAL HIGH (ref 15–41)
Albumin: 3.3 g/dL — ABNORMAL LOW (ref 3.5–5.0)
Alkaline Phosphatase: 65 U/L (ref 38–126)
Anion gap: 7 (ref 5–15)
BUN: 20 mg/dL (ref 6–20)
CO2: 23 mmol/L (ref 22–32)
Calcium: 8.8 mg/dL — ABNORMAL LOW (ref 8.9–10.3)
Chloride: 111 mmol/L (ref 98–111)
Creatinine, Ser: 0.95 mg/dL (ref 0.61–1.24)
GFR, Estimated: >60 mL/min (ref >60)
Glucose, Bld: 97 mg/dL (ref 70–99)
Potassium: 3.6 mmol/L (ref 3.5–5.1)
Sodium: 141 mmol/L (ref 135–145)
Total Bilirubin: 0.8 mg/dL (ref 0.3–1.2)
Total Protein: 6.8 g/dL (ref 6.5–8.1)

## 2022-03-15 LAB — LEVETIRACETAM LEVEL: Levetiracetam Lvl: <2 ug/mL — ABNORMAL LOW (ref 10.0–40.0)

## 2022-03-15 LAB — AMMONIA: Ammonia: 40 umol/L — ABNORMAL HIGH (ref 9–35)

## 2022-03-15 LAB — CK: Total CK: 1699 U/L — ABNORMAL HIGH (ref 49–397)

## 2022-03-15 LAB — PHOSPHORUS: Phosphorus: 3.3 mg/dL (ref 2.5–4.6)

## 2022-03-15 LAB — PROTIME-INR
INR: 0.9 (ref 0.8–1.2)
Prothrombin Time: 12.2 seconds (ref 11.4–15.2)

## 2022-03-15 LAB — TSH: TSH: 1.561 u[IU]/mL (ref 0.350–4.500)

## 2022-03-15 MED ORDER — LACTULOSE 10 GM/15ML PO SOLN
20.0000 g | Freq: Two times a day (BID) | ORAL | Status: DC
  Administered 2022-03-15 – 2022-03-16 (×3): 20 g via ORAL
  Filled 2022-03-15 (×4): qty 30

## 2022-03-15 MED ORDER — ACETAMINOPHEN 325 MG PO TABS
325.0000 mg | ORAL_TABLET | Freq: Once | ORAL | Status: AC
Start: 2022-03-15 — End: 2022-03-15
  Administered 2022-03-15: 325 mg via ORAL
  Filled 2022-03-15: qty 1

## 2022-03-15 MED ORDER — POTASSIUM CHLORIDE CRYS ER 20 MEQ PO TBCR
40.0000 meq | EXTENDED_RELEASE_TABLET | Freq: Once | ORAL | Status: AC
  Administered 2022-03-15: 40 meq via ORAL
  Filled 2022-03-15: qty 2

## 2022-03-15 NOTE — Progress Notes (Signed)
Offsite EEG completed; results pending. 

## 2022-03-16 ENCOUNTER — Other Ambulatory Visit: Payer: Self-pay

## 2022-03-16 DIAGNOSIS — F1010 Alcohol abuse, uncomplicated: Secondary | ICD-10-CM | POA: Diagnosis not present

## 2022-03-16 DIAGNOSIS — R001 Bradycardia, unspecified: Secondary | ICD-10-CM

## 2022-03-16 DIAGNOSIS — I10 Essential (primary) hypertension: Secondary | ICD-10-CM

## 2022-03-16 DIAGNOSIS — G9341 Metabolic encephalopathy: Secondary | ICD-10-CM | POA: Diagnosis not present

## 2022-03-16 DIAGNOSIS — R7989 Other specified abnormal findings of blood chemistry: Secondary | ICD-10-CM | POA: Diagnosis not present

## 2022-03-16 DIAGNOSIS — N179 Acute kidney failure, unspecified: Secondary | ICD-10-CM | POA: Diagnosis not present

## 2022-03-16 LAB — CBC
HCT: 38.4 % — ABNORMAL LOW (ref 39.0–52.0)
Hemoglobin: 13.1 g/dL (ref 13.0–17.0)
MCH: 31.6 pg (ref 26.0–34.0)
MCHC: 34.1 g/dL (ref 30.0–36.0)
MCV: 92.8 fL (ref 80.0–100.0)
Platelets: 134 10*3/uL — ABNORMAL LOW (ref 150–400)
RBC: 4.14 MIL/uL — ABNORMAL LOW (ref 4.22–5.81)
RDW: 13.2 % (ref 11.5–15.5)
WBC: 7.3 10*3/uL (ref 4.0–10.5)
nRBC: 0 % (ref 0.0–0.2)

## 2022-03-16 LAB — COMPREHENSIVE METABOLIC PANEL
ALT: 134 U/L — ABNORMAL HIGH (ref 0–44)
AST: 114 U/L — ABNORMAL HIGH (ref 15–41)
Albumin: 3.4 g/dL — ABNORMAL LOW (ref 3.5–5.0)
Alkaline Phosphatase: 77 U/L (ref 38–126)
Anion gap: 8 (ref 5–15)
BUN: 16 mg/dL (ref 6–20)
CO2: 25 mmol/L (ref 22–32)
Calcium: 9.3 mg/dL (ref 8.9–10.3)
Chloride: 107 mmol/L (ref 98–111)
Creatinine, Ser: 0.87 mg/dL (ref 0.61–1.24)
GFR, Estimated: >60 mL/min (ref >60)
Glucose, Bld: 99 mg/dL (ref 70–99)
Potassium: 3.7 mmol/L (ref 3.5–5.1)
Sodium: 140 mmol/L (ref 135–145)
Total Bilirubin: 0.7 mg/dL (ref 0.3–1.2)
Total Protein: 7 g/dL (ref 6.5–8.1)

## 2022-03-16 LAB — PHOSPHORUS: Phosphorus: 4.8 mg/dL — ABNORMAL HIGH (ref 2.5–4.6)

## 2022-03-16 LAB — CK: Total CK: 1087 U/L — ABNORMAL HIGH (ref 49–397)

## 2022-03-16 LAB — MAGNESIUM: Magnesium: 1.5 mg/dL — ABNORMAL LOW (ref 1.7–2.4)

## 2022-03-16 MED ORDER — ACETAMINOPHEN 325 MG PO TABS: 650.0000 mg | ORAL_TABLET | Freq: Four times a day (QID) | ORAL | Status: DC | PRN

## 2022-03-16 MED ORDER — AMLODIPINE BESYLATE 10 MG PO TABS
10.0000 mg | ORAL_TABLET | Freq: Every day | ORAL | Status: DC
  Administered 2022-03-16 – 2022-03-17 (×2): 10 mg via ORAL
  Filled 2022-03-16 (×2): qty 1

## 2022-03-16 MED ORDER — VALACYCLOVIR HCL 500 MG PO TABS
2000.0000 mg | ORAL_TABLET | Freq: Two times a day (BID) | ORAL | Status: AC
Start: 2022-03-16 — End: 2022-03-16
  Administered 2022-03-16 (×2): 2000 mg via ORAL
  Filled 2022-03-16 (×2): qty 4

## 2022-03-16 MED ORDER — POTASSIUM CHLORIDE CRYS ER 20 MEQ PO TBCR
40.0000 meq | EXTENDED_RELEASE_TABLET | Freq: Once | ORAL | Status: AC
  Administered 2022-03-16: 40 meq via ORAL
  Filled 2022-03-16: qty 2

## 2022-03-16 MED ORDER — THIAMINE HCL 100 MG PO TABS
100.0000 mg | ORAL_TABLET | Freq: Every day | ORAL | Status: DC
  Administered 2022-03-16 – 2022-03-17 (×2): 100 mg via ORAL
  Filled 2022-03-16 (×2): qty 1

## 2022-03-16 MED ORDER — ENOXAPARIN SODIUM 40 MG/0.4ML IJ SOSY
40.0000 mg | PREFILLED_SYRINGE | Freq: Every day | INTRAMUSCULAR | Status: DC
  Administered 2022-03-16 – 2022-03-17 (×2): 40 mg via SUBCUTANEOUS
  Filled 2022-03-16 (×2): qty 0.4

## 2022-03-16 MED ORDER — MAGNESIUM SULFATE 2 GM/50ML IV SOLN
2.0000 g | Freq: Once | INTRAVENOUS | Status: AC
Start: 2022-03-16 — End: 2022-03-16
  Administered 2022-03-16: 2 g via INTRAVENOUS
  Filled 2022-03-16: qty 50

## 2022-03-16 MED ORDER — NICOTINE 7 MG/24HR TD PT24
7.0000 mg | MEDICATED_PATCH | Freq: Every day | TRANSDERMAL | Status: DC
  Administered 2022-03-16 – 2022-03-17 (×2): 7 mg via TRANSDERMAL
  Filled 2022-03-16 (×2): qty 1

## 2022-03-16 MED ORDER — MAGNESIUM SULFATE 2 GM/50ML IV SOLN: 2.0000 g | Freq: Once | INTRAVENOUS | Status: DC

## 2022-03-16 NOTE — TOC Progression Note (Signed)
Transition of Care [facility]) - Progression Note    Patient Details  Name: Lucas Williams MRN: [id number] Date of Birth: 1976/03/14  [facility]) CM/SW Contact  Geni Bers, RN Phone Number: 03/16/2022, 2:05 PM  Clinical Narrative:     Pt has insurance and there are no free programs for his medications. Explained to pt that he will have a co-pay at [facility] a little cheaper than commercial pharmacy. Appointment at [facility] on 7/11 at 9:40 AM. Pt is aware.   Expected Discharge Plan: Home/Self Care Barriers to Discharge: No Barriers Identified  Expected Discharge Plan and Services Expected Discharge Plan: Home/Self Care       Living arrangements for the past 2 months: Single Family Home                                       Social Determinants of Health (SDOH) Interventions    Readmission Risk Interventions     No data to display

## 2022-03-16 NOTE — Progress Notes (Signed)
OT Cancellation Note  Patient Details Name: Maxximus Boeding MRN: [id number] DOB: May 04, 1976   Cancelled Treatment:    Reason Eval/Treat Not Completed: OT screened, no needs identified, will sign off Patient is MI in room at this time and reported he does not need OT. OT to sign off at this time. Thank you for the referral.  Sharyn Blitz OTR/L, MS Acute Rehabilitation Department Office# [phone] Pager# [phone]  03/16/2022, 7:55 AM

## 2022-03-16 NOTE — Progress Notes (Signed)
PHARMACIST - PHYSICIAN COMMUNICATION  DR:   Alanda Slim  CONCERNING: IV to Oral Route Change Policy  RECOMMENDATION: This patient is receiving thiamine by the intravenous route.  Based on criteria approved by the Pharmacy and Therapeutics Committee, the intravenous medication(s) is/are being converted to the equivalent oral dose form(s).   DESCRIPTION: These criteria include: The patient is eating (either orally or via tube) and/or has been taking other orally administered medications for a least 24 hours The patient has no evidence of active gastrointestinal bleeding or impaired GI absorption (gastrectomy, short bowel, patient on TNA or NPO).  If you have questions about this conversion, please contact the Pharmacy Department  []   [phone] )  [facility] []   [phone] )  [facility] []   [phone] )  [facility] []   [phone] )  [facility] [x]   [phone] )  [facility]   Loralee Pacas, PharmD, BCPS 03/16/2022 8:46 AM

## 2022-03-16 NOTE — Progress Notes (Signed)
PROGRESS NOTE  Lucas Williams ZOX:[id number] DOB: [birth year]   PCP: Pcp, No  Patient is from: Home.  Lives with his friend.  DOA: 03/13/2022 LOS: 3  Chief complaints Chief Complaint  Patient presents with   Altered Mental Status     Brief Narrative / Interim history: 46 year old M with PMH of EtOH abuse, tobacco use disorder, opiate abuse, seizure disorder, TBI and noncompliance brought to ED by [employer] after found banging on a business door that he claimed was his, and admitted for confusion, acute metabolic encephalopathy, alcohol withdrawal and AKI.  Creatinine 3.5 (baseline 0.6).  Mildly elevated LFT.  CBC, UDS and UA without significant finding.  EtOH level < 10%.  CT head without acute finding.  He was admitted to stepdown unit and started on CIWA protocol and IV fluid.  CT head negative for acute finding.  Next day, mental status improved.  Awake and oriented x4 except date and months.  Has no recollection into why he was brought to the hospital or banging on someone's door.  Reports cutting down on alcohol probably about 2 drinks a week.  Denies heavy drinking or recreational drug use.  Patient is improving.  Likely discharge in the next 24 hours if numbers stable off IV fluid.  Subjective: Seen and examined earlier this morning.  No major events overnight of this morning.  No complaints.  His tongue is still sore.  Denies chest pain, dyspnea, GI or UTI symptoms.  Objective: Vitals:   03/16/22 0104 03/16/22 0515 03/16/22 0639 03/16/22 0929  BP: (!) 149/102 (!) 167/100 (!) 174/103 (!) 167/89  Pulse: 60 (!) 47 (!) 48   Resp:  17    Temp:  97.8 F (36.6 C)    TempSrc:  Oral    SpO2:  99%    Weight:      Height:        Examination:  GENERAL: No apparent distress.  Nontoxic. HEENT: MMM.  Poor dentition.  Tongue lesion, looks like herpetic.  Vision and hearing grossly intact.  NECK: Supple.  No apparent JVD.  RESP:  No IWOB.  Fair aeration bilaterally. CVS:  RRR. Heart  sounds normal.  ABD/GI/GU: BS+. Abd soft, NTND.  MSK/EXT:  Moves extremities. No apparent deformity. No edema.  SKIN: no apparent skin lesion or wound NEURO: Awake and alert. Oriented appropriately.  No apparent focal neuro deficit. PSYCH: Calm. Normal affect.   Procedures:  None  Microbiology summarized: MRSA PCR screen negative.  Assessment and plan: Principal Problem:   Alcohol withdrawal (HCC) Active Problems:   TBI (traumatic brain injury) (HCC)   Alcohol abuse   Seizure (HCC)   Acute metabolic encephalopathy   AKI (acute kidney injury) (HCC)   Elevated LFTs   Tobacco abuse   Alcoholic cirrhosis of liver without ascites (HCC)   Tongue lesion   Thrombocytopenia (HCC)   Non-traumatic rhabdomyolysis   History of hepatitis C virus infection    EtOH abuse/EtOH withdrawal: Does not recall last drink.  Reports drinking 2 beers about once a week.  Does not recall last drink.  Initially, his CIWA score was as high as 18.  No withdrawal symptoms over the last 24 hours.  Did not require Ativan. -Discontinue CIWA monitoring and as needed Ativan -Continue multivitamin, thiamine and folic acid -Counseled on the importance of drinking cessation. -TOC consulted for resources.  Acute metabolic encephalopathy: Likely due to the above.  CT head, UDS and infectious work-up unrevealing.  BUN 55.  Ammonia 40.  He has  history of seizure.  Not compliant with his Keppra. Has history of TBI as well.  EEG negative for seizure or epileptiform discharge.  No focal neurodeficit.  Fairly oriented.  -Reorientation and delirium precaution -Continue home Keppra -Continue lactulose 20 g twice daily  AKI/azotemia: Resolved. Recent Labs    12/21/21 1944 01/11/22 0307 01/12/22 0226 01/22/22 1840 01/31/22 0530 02/28/22 0831 03/13/22 0828 03/14/22 0246 03/15/22 0427 03/16/22 0418  BUN 12 10 16 15 15 11  55* 39* 20 16  CREATININE 0.98 0.89 1.44* 1.03 0.87 0.67 3.48* 1.57* 0.95 0.87  -Monitor off  IV fluid. -Avoid nephrotoxins  Rhabdomyolysis: CK 2500>> 1700> 1100.  No report of trauma or injury.  Due to alcohol? -Monitor off IV fluid.  Uncontrolled hypertension/sinus bradycardia: Significantly elevated BP to 170/103 this morning.  Bradycardic to 40s. -Discontinue clonidine and IV fluid -Start amlodipine 10 mg daily -P.o. hydralazine as needed   Seizure d/o: Not compliant with Keppra.  EEG without seizure or epileptiform discharge. -Continue home Keppra.   Tobacco abuse: Reports smoking about a pack a week -Encouraged smoking cessation. -Nicotine patch if needed   Elevated LFTs/possible liver cirrhosis/hepatitis C infection: Likely due to alcohol and rhabdo..  RUQ [place] raises concern for cirrhosis.  Hepatitis C antibody positive.  He does not recall treatment.  LFT improving. 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 -Valtrex 2 g twice daily for 1 day. -Follow HSV PCR -Magic mouthwash with lidocaine  Mild thrombocytopenia: Likely due to alcohol.  Improving. -Continue monitoring  Hypomagnesemia: Mg 1.5.  K3.7. -P.o. KCl 40x1 -IV magnesium sulfate 2 g x 1   Body mass index is 28.94 kg/m.          DVT prophylaxis:  SCDs Start: 03/13/22 1405  Code Status: Full code Family Communication: None at bedside Level of care: Progressive Status is: Inpatient Remains inpatient appropriate because: Due to rhabdomyolysis and uncontrolled hypertension   Final disposition: Likely home on 6/28 if rhabdo improves off IV antibiotics. Consultants:  None  Sch Meds:  Scheduled Meds:  amLODipine  10 mg Oral Daily   Chlorhexidine Gluconate Cloth  6 each Topical Daily   folic acid  1 mg Oral Daily   lactulose  20 g Oral BID   levETIRAcetam  750 mg Oral BID   magic mouthwash w/lidocaine  5 mL Oral TID   multivitamin with minerals  1  tablet Oral Daily   nicotine  7 mg Transdermal Daily   thiamine  100 mg Oral Daily   valACYclovir  2,000 mg Oral BID   Continuous Infusions:   PRN Meds:.acetaminophen, albuterol, hydrALAZINE, ondansetron **OR** ondansetron (ZOFRAN) IV, mouth rinse  Antimicrobials: Anti-infectives (From admission, onward)    Start     Dose/Rate Route Frequency Ordered Stop   03/16/22 1145  valACYclovir (VALTREX) tablet 2,000 mg        2,000 mg Oral 2 times daily 03/16/22 1050 03/17/22 0959        I have personally reviewed the following labs and images: CBC: Recent Labs  Lab 03/13/22 0828 03/14/22 0246 03/16/22 0418  WBC 11.6* 7.0 7.3  NEUTROABS 8.7*  --   --   HGB 14.1 12.7* 13.1  HCT 40.3 37.3* 38.4*  MCV 90.4 93.7 92.8  PLT 163 129* 134*   BMP &GFR Recent Labs  Lab 03/13/22 0828 03/13/22 1058 03/14/22 0246 03/15/22 0427 03/16/22 0418  NA 133*  --  143 141 140  K 3.6  --  3.7 3.6 3.7  CL 102  --  117* 111 107  CO2 17*  --  20* 23 25  GLUCOSE 98  --  84 97 99  BUN 55*  --  39* 20 16  CREATININE 3.48*  --  1.57* 0.95 0.87  CALCIUM 9.3  --  8.8* 8.8* 9.3  MG  --  2.7*  --  1.9 1.5*  PHOS  --  4.1  --  3.3 4.8*   Estimated Creatinine Clearance: 129.4 mL/min (by C-G formula based on SCr of 0.87 mg/dL). Liver & Pancreas: Recent Labs  Lab 03/13/22 0828 03/14/22 0246 03/17/22 0427 03/16/22 0418  AST 192* 138* 120* 114*  ALT 143* 116* 122* 134*  ALKPHOS 67 57 65 77  BILITOT 2.0* 1.5* 0.8 0.7  PROT 8.7* 6.9 6.8 7.0  ALBUMIN 4.6 3.5 3.3* 3.4*   No results for input(s): "LIPASE", "AMYLASE" in the last 168 hours. Recent Labs  Lab 2022-03-17 0427  AMMONIA 40*   Diabetic: No results for input(s): "HGBA1C" in the last 72 hours. No results for input(s): "GLUCAP" in the last 168 hours. Cardiac Enzymes: Recent Labs  Lab 03/14/22 1101 2022/03/17 0427 03/16/22 0418  CKTOTAL 2,492* 1,699* 1,087*   No results for input(s): "PROBNP" in the last 8760 hours. Coagulation  Profile: Recent Labs  Lab 03-17-2022 0427  INR 0.9   Thyroid Function Tests: Recent Labs    2022-03-17 0427  TSH 1.561   Lipid Profile: No results for input(s): "CHOL", "HDL", "LDLCALC", "TRIG", "CHOLHDL", "LDLDIRECT" in the last 72 hours. Anemia Panel: No results for input(s): "VITAMINB12", "FOLATE", "FERRITIN", "TIBC", "IRON", "RETICCTPCT" in the last 72 hours. Urine analysis:    Component Value Date/Time   COLORURINE YELLOW 02/28/2022 0752   APPEARANCEUR CLEAR 02/28/2022 0752   LABSPEC 1.026 02/28/2022 0752   PHURINE 5.0 02/28/2022 0752   GLUCOSEU NEGATIVE 02/28/2022 0752   HGBUR NEGATIVE 02/28/2022 0752   BILIRUBINUR NEGATIVE 02/28/2022 0752   KETONESUR NEGATIVE 02/28/2022 0752   PROTEINUR NEGATIVE 02/28/2022 0752   NITRITE NEGATIVE 02/28/2022 0752   LEUKOCYTESUR NEGATIVE 02/28/2022 0752   Sepsis Labs: Invalid input(s): "PROCALCITONIN", "LACTICIDVEN"  Microbiology: Recent Results (from the past 240 hour(s))  MRSA Next Gen by PCR, Nasal     Status: None   Collection Time: 03/13/22  3:30 PM   Specimen: Nasal Mucosa; Nasal Swab  Result Value Ref Range Status   MRSA by PCR Next Gen NOT DETECTED NOT DETECTED Final    Comment: (NOTE) The GeneXpert MRSA Assay (FDA approved for NASAL specimens only), is one component of a comprehensive MRSA colonization surveillance program. It is not intended to diagnose MRSA infection nor to guide or monitor treatment for MRSA infections. Test performance is not FDA approved in patients less than 50 years old. Performed at [facility], [address] [place], [place] [address]     Radiology Studies: EEG adult  Result Date: 03/17/22 Charlsie Quest, MD     2022/03/17  1:34 PM Patient Name: Lyrik Dickow MRN: [id number] Epilepsy Attending: Charlsie Quest Referring Physician/Provider: Almon Hercules, MD Date: 03-17-22 Duration: 22.16 mins Patient history: 46 year old male with altered mental status.  EEG to  evaluate for seizure. Level of alertness: Awake, asleep AEDs during EEG study: LEV Technical aspects: This EEG study was done with scalp electrodes positioned according to the 10-20 International system of electrode placement. Electrical activity was acquired at a sampling rate of 500Hz  and reviewed  with a high frequency filter of 70Hz  and a low frequency filter of 1Hz . EEG data were recorded continuously and digitally stored. Description: The posterior dominant rhythm consists of 9-10 Hz activity of moderate voltage (25-35 uV) seen predominantly in posterior head regions, symmetric and reactive to eye opening and eye closing. Sleep was characterized by vertex waves, sleep spindles (12 to 14 Hz), maximal frontocentral region.  There is an excessive amount of 15 to 18 Hz beta activity distributed symmetrically and diffusely. Hyperventilation and photic stimulation were not performed.   ABNORMALITY - Excessive beta, generalized IMPRESSION: This study is within normal limits. The excessive beta activity seen in the background is most likely due to the effect of benzodiazepine and is a benign EEG pattern. No seizures or epileptiform discharges were seen throughout the recording. Priyanka Annabelle Harman      Charle Clear T. Niranjan Rufener [facility]  If 7PM-7AM, please contact night-coverage [email or web address] 03/16/2022, 11:48 AM

## 2022-03-17 ENCOUNTER — Other Ambulatory Visit: Payer: Self-pay

## 2022-03-17 DIAGNOSIS — F10932 Alcohol use, unspecified with withdrawal with perceptual disturbance: Secondary | ICD-10-CM | POA: Diagnosis not present

## 2022-03-17 DIAGNOSIS — G9341 Metabolic encephalopathy: Secondary | ICD-10-CM | POA: Diagnosis not present

## 2022-03-17 DIAGNOSIS — F1010 Alcohol abuse, uncomplicated: Secondary | ICD-10-CM | POA: Diagnosis not present

## 2022-03-17 DIAGNOSIS — N179 Acute kidney failure, unspecified: Secondary | ICD-10-CM | POA: Diagnosis not present

## 2022-03-17 LAB — CBC
HCT: 42.5 % (ref 39.0–52.0)
Hemoglobin: 14.4 g/dL (ref 13.0–17.0)
MCH: 31.6 pg (ref 26.0–34.0)
MCHC: 33.9 g/dL (ref 30.0–36.0)
MCV: 93.2 fL (ref 80.0–100.0)
Platelets: 166 10*3/uL (ref 150–400)
RBC: 4.56 MIL/uL (ref 4.22–5.81)
RDW: 13.4 % (ref 11.5–15.5)
WBC: 6.6 10*3/uL (ref 4.0–10.5)
nRBC: 0 % (ref 0.0–0.2)

## 2022-03-17 LAB — COMPREHENSIVE METABOLIC PANEL
ALT: 165 U/L — ABNORMAL HIGH (ref 0–44)
AST: 128 U/L — ABNORMAL HIGH (ref 15–41)
Albumin: 3.7 g/dL (ref 3.5–5.0)
Alkaline Phosphatase: 71 U/L (ref 38–126)
Anion gap: 8 (ref 5–15)
BUN: 17 mg/dL (ref 6–20)
CO2: 28 mmol/L (ref 22–32)
Calcium: 9.8 mg/dL (ref 8.9–10.3)
Chloride: 104 mmol/L (ref 98–111)
Creatinine, Ser: 1.01 mg/dL (ref 0.61–1.24)
GFR, Estimated: >60 mL/min (ref >60)
Glucose, Bld: 107 mg/dL — ABNORMAL HIGH (ref 70–99)
Potassium: 3.8 mmol/L (ref 3.5–5.1)
Sodium: 140 mmol/L (ref 135–145)
Total Bilirubin: 0.8 mg/dL (ref 0.3–1.2)
Total Protein: 7.7 g/dL (ref 6.5–8.1)

## 2022-03-17 LAB — MAGNESIUM: Magnesium: 1.8 mg/dL (ref 1.7–2.4)

## 2022-03-17 LAB — PHOSPHORUS: Phosphorus: 4.3 mg/dL (ref 2.5–4.6)

## 2022-03-17 LAB — CK: Total CK: 511 U/L — ABNORMAL HIGH (ref 49–397)

## 2022-03-17 MED ORDER — CHLORHEXIDINE GLUCONATE 0.12 % MT SOLN
15.0000 mL | Freq: Two times a day (BID) | OROMUCOSAL | 0 refills | Status: AC
  Filled 2022-03-17: qty 473, 16d supply, fill #0

## 2022-03-17 MED ORDER — AMLODIPINE BESYLATE 10 MG PO TABS
10.0000 mg | ORAL_TABLET | Freq: Every day | ORAL | 1 refills | Status: AC
  Filled 2022-03-17: qty 30, 30d supply, fill #0

## 2022-03-17 MED ORDER — THIAMINE HCL 100 MG PO TABS
100.0000 mg | ORAL_TABLET | Freq: Every day | ORAL | 1 refills | Status: AC
Start: 2022-03-17 — End: ?
  Filled 2022-03-17: qty 30, 30d supply, fill #0

## 2022-03-17 MED ORDER — LEVETIRACETAM 750 MG PO TABS
750.0000 mg | ORAL_TABLET | Freq: Two times a day (BID) | ORAL | 1 refills | Status: AC
  Filled 2022-03-17: qty 180, 90d supply, fill #0
  Filled 2022-03-17: qty 60, 30d supply, fill #0

## 2022-03-17 MED ORDER — FOLIC ACID 1 MG PO TABS
1.0000 mg | ORAL_TABLET | Freq: Every day | ORAL | 1 refills | Status: AC
  Filled 2022-03-17: qty 30, 30d supply, fill #0

## 2022-03-17 NOTE — Progress Notes (Signed)
AVS and discharge instructions reviewed w/ patient. Patient verbalized understanding and had no further questions. 

## 2022-03-17 NOTE — TOC Progression Note (Signed)
Transition of Care [facility]) - Progression Note    Patient Details  Name: Lucas Williams MRN: [id number] Date of Birth: Mar 01, 1976  [facility]) CM/SW Contact  Geni Bers, RN Phone Number: 03/17/2022, 1:26 PM  Clinical Narrative:     Pt states he did not need Substance Abuse information. Pt asked for bus pass to get medications and home. Bus pass given to pt. Pt was encouraged to keep his MD appointment.   Expected Discharge Plan: Home/Self Care Barriers to Discharge: No Barriers Identified  Expected Discharge Plan and Services Expected Discharge Plan: Home/Self Care       Living arrangements for the past 2 months: Single Family Home Expected Discharge Date: 03/17/22                                     Social Determinants of Health (SDOH) Interventions    Readmission Risk Interventions     No data to display

## 2022-03-17 NOTE — Plan of Care (Signed)

## 2022-03-17 NOTE — Discharge Summary (Signed)
Physician Discharge Summary  Lucas Williams CHY:[id number] DOB: 1976/05/23 DOA: 03/13/2022  PCP: Pcp, No  Admit date: 03/13/2022 Discharge date: 03/17/2022 Admitted From: Home Disposition: Home Recommendations for Outpatient Follow-up:  Follow ups as below. Please obtain CBC and CMP at follow-up. Consider outpatient referral to infectious disease for hepatitis C evaluation Please follow up on the following pending results: HSV PCR from his tongue  Home Health: Not indicated Equipment/Devices: Not indicated  Discharge Condition: Stable CODE STATUS: Full code  Follow-up Information     Primary Care at [facility] Follow up.   Specialty: Family Medicine Why: Appointment 7/11 at 09:40 AM. Please keep this appointment. Contact information: [address] [place] [address] [phone]        [facility] Follow up.   Why: Your medications are here and there will be a fee. Contact information: [address] [place] [address] [phone]                Hospital course 46 year old M with PMH of EtOH abuse, tobacco use disorder, opiate abuse, seizure disorder, TBI and noncompliance brought to ED by [employer] after found banging on a business door that he claimed was his, and admitted for confusion, acute metabolic encephalopathy, alcohol withdrawal and AKI.  Creatinine 3.5 (baseline 0.6).  Mildly elevated LFT.  CBC, UDS and UA without significant finding.  EtOH level < 10%.  CT head without acute finding.  He was admitted to stepdown unit and started on CIWA protocol and IV fluid.  CT head negative for acute finding.   Next day, mental status improved.  Awake and oriented x4 except date and months.  Has no recollection into why he was brought to the hospital or banging on someone's door.  Reports cutting down on alcohol probably about 2 drinks a week.  Denies heavy drinking or  recreational drug use.   Patient without further withdrawal symptoms.  Encephalopathy and AKI resolved.  Rhabdomyolysis improved and continued to improve of IV fluid.  He was discharged to follow-up with primary care doctor.  Prescription sent [facility] where he follow-up for further care.  Patient was counseled on alcohol and tobacco cessation and provided with resources.  See individual problem list below for more.   Problems addressed during this hospitalization Principal Problem:   Alcohol withdrawal (HCC) Active Problems:   TBI (traumatic brain injury) (HCC)   Alcohol abuse   Seizure (HCC)   Acute metabolic encephalopathy   AKI (acute kidney injury) (HCC)   Elevated LFTs   Tobacco abuse   Alcoholic cirrhosis of liver without ascites (HCC)   Tongue lesion   Thrombocytopenia (HCC)   Non-traumatic rhabdomyolysis   History of hepatitis C virus infection   Hypomagnesemia   Uncontrolled hypertension   Sinus bradycardia   EtOH abuse/EtOH withdrawal: Does not recall last drink.  Reports drinking 2 beers about once a week.  Does not recall last drink.  Initially, his CIWA score was as high as 18. No withdrawal symptoms or Ativan requirement for 48 hours prior to discharge. -Continue multivitamin, thiamine and folic acid -Counseled on the importance of drinking cessation.  Provided with resources.   Acute metabolic encephalopathy: Likely due to the above.  CT head, UDS and infectious work-up unrevealing.  BUN 55.  Ammonia 40.  He has history of seizure.  Not compliant with his Keppra. Has history of TBI as well.  EEG negative for  seizure or epileptiform discharge.  No focal neurodeficit.  Fairly oriented.  -Refilled his Keppra   AKI/azotemia: Resolved. -Recheck at follow-up   Rhabdomyolysis: CK 2500>> 1700> 1100> 511 no report of trauma or injury.  Due to alcohol?  Improved off IV fluid.   Uncontrolled hypertension/sinus bradycardia: Improved.   Bradycardic to 40s. -Discharged on amlodipine 10 mg daily   Seizure d/o: Not compliant with Keppra.  EEG without seizure or epileptiform discharge. -Refilled Keppra.   Tobacco abuse: Reports smoking about a pack a week -Encouraged smoking cessation.   Elevated LFTs/possible liver cirrhosis/hepatitis C infection: Likely due to alcohol and rhabdo..  RUQ [place] raises concern for cirrhosis.  Hepatitis C antibody and HSV 1 IgG positive.   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 -Valtrex 2 g twice daily for 1 day on 6/27. -Follow HSV PCR -Gave Rx for chlorhexidine mouth rinse   Mild thrombocytopenia: Likely due to alcohol.  Resolved.   Hypomagnesemia: Resolved.           Vital signs Vitals:   03/16/22 0929 03/16/22 1256 03/16/22 2122 03/17/22 0511  BP: (!) 167/89 138/90 (!) 157/93 (!) 133/91  Pulse:  66 68 62  Temp:  98.2 F (36.8 C) 98.4 F (36.9 C) 97.8 F (36.6 C)  Resp:  18 14 18   Height:      Weight:      SpO2:  93% 99% 97%  TempSrc:  Oral    BMI (Calculated):         Discharge exam  GENERAL: No apparent distress.  Nontoxic. HEENT: MMM.  Tongue lesion looks like a herpetic lesion.  Poor dentition. NECK: Supple.  No apparent JVD.  RESP:  No IWOB.  Fair aeration bilaterally. CVS:  RRR. Heart sounds normal.  ABD/GI/GU: BS+. Abd soft, NTND.  MSK/EXT:  Moves extremities. No apparent deformity. No edema.  SKIN: no apparent skin lesion or wound NEURO: Awake and alert. Oriented appropriately.  No apparent focal neuro deficit. PSYCH: Calm. Normal affect.   Discharge Instructions Discharge Instructions     Call MD for:  extreme fatigue   Complete by: As directed    Call MD for:  persistant dizziness or light-headedness   Complete by: As directed    Call MD for:  temperature >100.4   Complete by: As directed     Diet - low sodium heart healthy   Complete by: As directed    Discharge instructions   Complete by: As directed    It has been a pleasure taking care of you!  You were hospitalized due to confusion, dehydration and acute kidney injury for which you have been treated.  Your symptoms improved to the point we think it is safe to let you go home and follow-up with your primary care doctor.  We strongly recommend you take your medication as prescribed.  We also recommend you stop drinking alcohol.  Please review your new medication list and the directions on your medications before you take them.  It is important that you quit smoking cigarettes.  You may use nicotine patch to help you quit smoking.  Nicotine patch is available over-the-counter.  You may also discuss other options to help you quit smoking with your primary care doctor. You can also talk to professional counselors at 1-800-QUIT-NOW (531)698-6426) for free smoking cessation counseling.     Take care,  Increase activity slowly   Complete by: As directed    No wound care   Complete by: As directed       Allergies as of 03/17/2022       Reactions   Other Nausea And Vomiting   Olives        Medication List     STOP taking these medications    cephALEXin 500 MG capsule Commonly known as: KEFLEX   etodolac 300 MG capsule Commonly known as: LODINE   ibuprofen 200 MG tablet Commonly known as: ADVIL   methocarbamol 500 MG tablet Commonly known as: ROBAXIN       TAKE these medications    amLODipine 10 MG tablet Commonly known as: NORVASC Take 1 tablet (10 mg total) by mouth daily.   chlorhexidine 0.12 % solution Commonly known as: PERIDEX Use as directed 15 mLs in the mouth or throat 2 (two) times daily.   folic acid 1 MG tablet Commonly known as: FOLVITE Take 1 tablet (1 mg total) by mouth daily.   levETIRAcetam 750 MG tablet Commonly known as: Keppra Take 1 tablet (750 mg total) by mouth 2 (two)  times daily.   Multivitamin Adult Tabs Take 1 tablet by mouth daily.   nicotine 7 mg/24hr patch Commonly known as: NICODERM CQ - dosed in mg/24 hr Place 1 patch (7 mg total) onto the skin daily.   thiamine 100 MG tablet Take 1 tablet (100 mg total) by mouth daily.        Consultations: None  Procedures/Studies:   EEG adult  Result Date: 03/15/2022 Charlsie QuestYadav, Priyanka O, MD     03/15/2022  1:34 PM Patient Name: Janan Haltererry Schlauch MRN: [id number] Epilepsy Attending: Charlsie QuestPriyanka O Yadav Referring Physician/Provider: Almon HerculesGonfa, Jenner Rosier T, MD Date: 03/15/2022 Duration: 22.16 mins Patient history: 46 year old male with altered mental status.  EEG to evaluate for seizure. Level of alertness: Awake, asleep AEDs during EEG study: LEV Technical aspects: This EEG study was done with scalp electrodes positioned according to the 10-20 International system of electrode placement. Electrical activity was acquired at a sampling rate of 500Hz  and reviewed with a high frequency filter of 70Hz  and a low frequency filter of 1Hz . EEG data were recorded continuously and digitally stored. Description: The posterior dominant rhythm consists of 9-10 Hz activity of moderate voltage (25-35 uV) seen predominantly in posterior head regions, symmetric and reactive to eye opening and eye closing. Sleep was characterized by vertex waves, sleep spindles (12 to 14 Hz), maximal frontocentral region.  There is an excessive amount of 15 to 18 Hz beta activity distributed symmetrically and diffusely. Hyperventilation and photic stimulation were not performed.   ABNORMALITY - Excessive beta, generalized IMPRESSION: This study is within normal limits. The excessive beta activity seen in the background is most likely due to the effect of benzodiazepine and is a benign EEG pattern. No seizures or epileptiform discharges were seen throughout the recording. Priyanka Annabelle Harman Yadav   US Abdomen Complete  Result Date: 03/13/2022 CLINICAL DATA:  Acute renal  insufficiency. Elevated liver function studies. EXAM: ABDOMEN ULTRASOUND COMPLETE COMPARISON:  None Available. FINDINGS: Gallbladder: No gallstones or wall thickening visualized. No sonographic Murphy sign noted by sonographer. Common bile duct: Diameter: 3.0 mm Liver: The liver contour appears slightly irregular which could suggest changes of cirrhosis. No hepatic lesions are identified. No intrahepatic biliary dilatation. The echogenicity is grossly normal. Portal vein is patent on color Doppler imaging with normal direction of blood flow towards the liver. IVC: Normal caliber Pancreas:  Visualized portion unremarkable. Spleen: Normal size.  No focal lesions. Right Kidney: Length: 10.8 cm. Normal renal cortical thickness and echogenicity without focal lesions or hydronephrosis. Left Kidney: Length: 10.1 cm. Normal renal cortical thickness and echogenicity without focal lesions or hydronephrosis. Abdominal aorta: Normal caliber. Other findings: No ascites. IMPRESSION: 1. The liver contour appears irregular. Possible changes of cirrhosis. No hepatic lesions or biliary dilatation. 2. Limited visualization of the pancreas. 3. The spleen is normal in size and both kidneys are normal. 4. No ascites. Electronically Signed   By: Rudie Meyer M.D.   On: 03/13/2022 11:56   CT Head Wo Contrast  Result Date: 03/13/2022 CLINICAL DATA:  Mental status changes. EXAM: CT HEAD WITHOUT CONTRAST TECHNIQUE: Contiguous axial images were obtained from the base of the skull through the vertex without intravenous contrast. RADIATION DOSE REDUCTION: This exam was performed according to the departmental dose-optimization program which includes automated exposure control, adjustment of the mA and/or kV according to patient size and/or use of iterative reconstruction technique. COMPARISON:  01/31/2022 FINDINGS: Brain: There is no evidence for acute hemorrhage, hydrocephalus, mass lesion, or abnormal extra-axial fluid collection. No  definite CT evidence for acute infarction. Encephalomalacia in the right MCA territory again noted. Medial left frontal lobe encephalomalacia is also stable in the interval. Vascular: No hyperdense vessel or unexpected calcification. Skull: No evidence for fracture. No worrisome lytic or sclerotic lesion. Sinuses/Orbits: The visualized paranasal sinuses and mastoid air cells are clear. Visualized portions of the globes and intraorbital fat are unremarkable. Other: None IMPRESSION: 1. No acute intracranial abnormality. 2. Stable right greater than left cerebral encephalomalacia. Electronically Signed   By: Kennith Center M.D.   On: 03/13/2022 08:49   CT Lumbar Spine Wo Contrast  Result Date: 02/28/2022 CLINICAL DATA:  Low back pain, trauma. EXAM: CT LUMBAR SPINE WITHOUT CONTRAST TECHNIQUE: Multidetector CT imaging of the lumbar spine was performed without intravenous contrast administration. Multiplanar CT image reconstructions were also generated. RADIATION DOSE REDUCTION: This exam was performed according to the departmental dose-optimization program which includes automated exposure control, adjustment of the mA and/or kV according to patient size and/or use of iterative reconstruction technique. COMPARISON:  None Available. FINDINGS: Segmentation: 5 lumbar type vertebrae Alignment: Normal. Vertebrae: No acute fracture or focal pathologic process. Paraspinal and other soft tissues: Negative for perispinal mass or inflammation Disc levels: Disc space narrowing and ridging especially at L3-4 and below. Right preferential disc bulging and endplate spurring causes moderate right foraminal narrowing at L3-4 and L4-5. Moderate left foraminal narrowing at L5-S1 due to disc height loss and endplate spurring. IMPRESSION: 1. No acute finding. 2. 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 Electronically Signed   By: Tiburcio Pea  M.D.   On: 02/28/2022 09:19       The results of significant diagnostics from this hospitalization (including imaging, microbiology, ancillary and laboratory) are listed below for reference.     Microbiology: Recent Results (from the past 240 hour(s))  MRSA Next Gen by PCR, Nasal     Status: None   Collection Time: 03/13/22  3:30 PM   Specimen: Nasal Mucosa; Nasal Swab  Result Value Ref Range Status   MRSA by PCR Next Gen NOT DETECTED NOT DETECTED Final    Comment: (NOTE) The GeneXpert MRSA Assay (FDA approved for NASAL specimens only), is one component of a comprehensive MRSA colonization surveillance program. It is not intended to  diagnose MRSA infection nor to guide or monitor treatment for MRSA infections. Test performance is not FDA approved in patients less than 104 years old. Performed at [facility], [address], [place], [place] [address]      Labs:  CBC: Recent Labs  Lab 03/13/22 [phone] 03/14/22 0246 03/16/22 0418 03/17/22 0833  WBC 11.6* 7.0 7.3 6.6  NEUTROABS 8.7*  --   --   --   HGB 14.1 12.7* 13.1 14.4  HCT 40.3 37.3* 38.4* 42.5  MCV 90.4 93.7 92.8 93.2  PLT 163 129* 134* 166   BMP &GFR Recent Labs  Lab 03/13/22 0828 03/13/22 1058 03/14/22 0246 03/15/22 0427 03/16/22 0418 03/17/22 0833  NA 133*  --  143 141 140 140  K 3.6  --  3.7 3.6 3.7 3.8  CL 102  --  117* 111 107 104  CO2 17*  --  20* 23 25 28   GLUCOSE 98  --  84 97 99 107*  BUN 55*  --  39* 20 16 17   CREATININE 3.48*  --  1.57* 0.95 0.87 1.01  CALCIUM 9.3  --  8.8* 8.8* 9.3 9.8  MG  --  2.7*  --  1.9 1.5* 1.8  PHOS  --  4.1  --  3.3 4.8* 4.3   Estimated Creatinine Clearance: 111.4 mL/min (by C-G formula based on SCr of 1.01 mg/dL). Liver & Pancreas: Recent Labs  Lab 03/13/22 0828 03/14/22 0246 03/15/22 0427 03/16/22 0418 03/17/22 0833  AST 192* 138* 120* 114* 128*  ALT 143* 116* 122* 134* 165*  ALKPHOS 67 57 65 77 71  BILITOT 2.0* 1.5* 0.8 0.7 0.8  PROT  8.7* 6.9 6.8 7.0 7.7  ALBUMIN 4.6 3.5 3.3* 3.4* 3.7   No results for input(s): "LIPASE", "AMYLASE" in the last 168 hours. Recent Labs  Lab 03/15/22 0427  AMMONIA 40*   Diabetic: No results for input(s): "HGBA1C" in the last 72 hours. No results for input(s): "GLUCAP" in the last 168 hours. Cardiac Enzymes: Recent Labs  Lab 03/14/22 1101 03/15/22 0427 03/16/22 0418 03/17/22 0833  CKTOTAL 2,492* 1,699* 1,087* 511*   No results for input(s): "PROBNP" in the last 8760 hours. Coagulation Profile: Recent Labs  Lab 03/15/22 0427  INR 0.9   Thyroid Function Tests: Recent Labs    03/15/22 0427  TSH 1.561   Lipid Profile: No results for input(s): "CHOL", "HDL", "LDLCALC", "TRIG", "CHOLHDL", "LDLDIRECT" in the last 72 hours. Anemia Panel: No results for input(s): "VITAMINB12", "FOLATE", "FERRITIN", "TIBC", "IRON", "RETICCTPCT" in the last 72 hours. Urine analysis:    Component Value Date/Time   COLORURINE YELLOW 02/28/2022 0752   APPEARANCEUR CLEAR 02/28/2022 0752   LABSPEC 1.026 02/28/2022 0752   PHURINE 5.0 02/28/2022 0752   GLUCOSEU NEGATIVE 02/28/2022 0752   HGBUR NEGATIVE 02/28/2022 0752   BILIRUBINUR NEGATIVE 02/28/2022 0752   KETONESUR NEGATIVE 02/28/2022 0752   PROTEINUR NEGATIVE 02/28/2022 0752   NITRITE NEGATIVE 02/28/2022 0752   LEUKOCYTESUR NEGATIVE 02/28/2022 0752   Sepsis Labs: Invalid input(s): "PROCALCITONIN", "LACTICIDVEN"   SIGNED:  04/30/2022, MD  [facility] 03/17/2022, 5:20 PM

## 2022-03-18 ENCOUNTER — Other Ambulatory Visit: Payer: Self-pay

## 2022-03-23 NOTE — Progress Notes (Signed)
Erroneous encounter-disregard

## 2022-03-30 ENCOUNTER — Encounter: Payer: 59 | Admitting: Family

## 2022-03-30 DIAGNOSIS — F10939 Alcohol use, unspecified with withdrawal, unspecified: Secondary | ICD-10-CM

## 2022-03-30 DIAGNOSIS — I10 Essential (primary) hypertension: Secondary | ICD-10-CM

## 2022-03-30 DIAGNOSIS — R569 Unspecified convulsions: Secondary | ICD-10-CM

## 2022-03-30 DIAGNOSIS — Z720 Tobacco use: Secondary | ICD-10-CM

## 2022-03-30 DIAGNOSIS — G9341 Metabolic encephalopathy: Secondary | ICD-10-CM

## 2022-03-30 DIAGNOSIS — R001 Bradycardia, unspecified: Secondary | ICD-10-CM

## 2022-03-30 DIAGNOSIS — Z7689 Persons encountering health services in other specified circumstances: Secondary | ICD-10-CM

## 2022-03-30 DIAGNOSIS — Z09 Encounter for follow-up examination after completed treatment for conditions other than malignant neoplasm: Secondary | ICD-10-CM

## 2022-03-30 DIAGNOSIS — D696 Thrombocytopenia, unspecified: Secondary | ICD-10-CM

## 2022-03-30 DIAGNOSIS — R7989 Other specified abnormal findings of blood chemistry: Secondary | ICD-10-CM

## 2022-03-30 DIAGNOSIS — Z8619 Personal history of other infectious and parasitic diseases: Secondary | ICD-10-CM

## 2022-03-30 DIAGNOSIS — F1010 Alcohol abuse, uncomplicated: Secondary | ICD-10-CM

## 2022-03-30 DIAGNOSIS — K7030 Alcoholic cirrhosis of liver without ascites: Secondary | ICD-10-CM

## 2022-03-30 DIAGNOSIS — N179 Acute kidney failure, unspecified: Secondary | ICD-10-CM

## 2022-03-30 DIAGNOSIS — K148 Other diseases of tongue: Secondary | ICD-10-CM

## 2022-03-30 DIAGNOSIS — S069XAD Unspecified intracranial injury with loss of consciousness status unknown, subsequent encounter: Secondary | ICD-10-CM

## 2022-03-30 DIAGNOSIS — M6282 Rhabdomyolysis: Secondary | ICD-10-CM

## 2022-04-04 ENCOUNTER — Other Ambulatory Visit: Payer: Self-pay

## 2022-04-04 ENCOUNTER — Encounter (HOSPITAL_COMMUNITY): Payer: Self-pay | Admitting: Emergency Medicine

## 2022-04-04 ENCOUNTER — Emergency Department (HOSPITAL_COMMUNITY)
Admission: EM | Admit: 2022-04-04 | Discharge: 2022-04-05 | Disposition: A | Discharge status: Home / Self Care | Payer: 59 | Attending: Emergency Medicine | Admitting: Emergency Medicine

## 2022-04-04 DIAGNOSIS — F10920 Alcohol use, unspecified with intoxication, uncomplicated: Secondary | ICD-10-CM

## 2022-04-04 DIAGNOSIS — R464 Slowness and poor responsiveness: Secondary | ICD-10-CM | POA: Insufficient documentation

## 2022-04-04 DIAGNOSIS — F10129 Alcohol abuse with intoxication, unspecified: Secondary | ICD-10-CM | POA: Diagnosis present

## 2022-04-04 DIAGNOSIS — Z79899 Other long term (current) drug therapy: Secondary | ICD-10-CM | POA: Insufficient documentation

## 2022-04-04 DIAGNOSIS — Y908 Blood alcohol level of 240 mg/100 ml or more: Secondary | ICD-10-CM | POA: Insufficient documentation

## 2022-04-04 DIAGNOSIS — R569 Unspecified convulsions: Secondary | ICD-10-CM | POA: Diagnosis not present

## 2022-04-04 LAB — CBG MONITORING, ED: Glucose-Capillary: 122 mg/dL — ABNORMAL HIGH (ref 70–99)

## 2022-04-04 LAB — CBC WITH DIFFERENTIAL/PLATELET
Abs Immature Granulocytes: 0.02 10*3/uL (ref 0.00–0.07)
Basophils Absolute: 0 10*3/uL (ref 0.0–0.1)
Basophils Relative: 1 %
Eosinophils Absolute: 0.1 10*3/uL (ref 0.0–0.5)
Eosinophils Relative: 2 %
HCT: 38.8 % — ABNORMAL LOW (ref 39.0–52.0)
Hemoglobin: 12.8 g/dL — ABNORMAL LOW (ref 13.0–17.0)
Immature Granulocytes: 0 %
Lymphocytes Relative: 46 %
Lymphs Abs: 2.6 10*3/uL (ref 0.7–4.0)
MCH: 31.2 pg (ref 26.0–34.0)
MCHC: 33 g/dL (ref 30.0–36.0)
MCV: 94.6 fL (ref 80.0–100.0)
Monocytes Absolute: 0.6 10*3/uL (ref 0.1–1.0)
Monocytes Relative: 11 %
Neutro Abs: 2.2 10*3/uL (ref 1.7–7.7)
Neutrophils Relative %: 40 %
Platelets: 169 10*3/uL (ref 150–400)
RBC: 4.1 MIL/uL — ABNORMAL LOW (ref 4.22–5.81)
RDW: 14.1 % (ref 11.5–15.5)
WBC: 5.7 10*3/uL (ref 4.0–10.5)
nRBC: 0 % (ref 0.0–0.2)

## 2022-04-04 LAB — COMPREHENSIVE METABOLIC PANEL
ALT: 126 U/L — ABNORMAL HIGH (ref 0–44)
AST: 106 U/L — ABNORMAL HIGH (ref 15–41)
Albumin: 3.3 g/dL — ABNORMAL LOW (ref 3.5–5.0)
Alkaline Phosphatase: 114 U/L (ref 38–126)
Anion gap: 12 (ref 5–15)
BUN: 10 mg/dL (ref 6–20)
CO2: 24 mmol/L (ref 22–32)
Calcium: 8 mg/dL — ABNORMAL LOW (ref 8.9–10.3)
Chloride: 110 mmol/L (ref 98–111)
Creatinine, Ser: 0.97 mg/dL (ref 0.61–1.24)
GFR, Estimated: >60 mL/min (ref >60)
Glucose, Bld: 130 mg/dL — ABNORMAL HIGH (ref 70–99)
Potassium: 3.5 mmol/L (ref 3.5–5.1)
Sodium: 146 mmol/L — ABNORMAL HIGH (ref 135–145)
Total Bilirubin: 0.6 mg/dL (ref 0.3–1.2)
Total Protein: 6.4 g/dL — ABNORMAL LOW (ref 6.5–8.1)

## 2022-04-04 LAB — ETHANOL: Alcohol, Ethyl (B): 447 mg/dL (ref <10)
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
## 2022-04-04 NOTE — ED Notes (Signed)
Pt decided to leave while waiting for a room.  

## 2022-04-05 ENCOUNTER — Emergency Department (HOSPITAL_COMMUNITY): Payer: 59

## 2022-04-05 MED ORDER — LEVETIRACETAM 500 MG PO TABS
750.0000 mg | ORAL_TABLET | Freq: Once | ORAL | Status: AC
  Administered 2022-04-05: 750 mg via ORAL
  Filled 2022-04-05: qty 1

## 2022-04-05 NOTE — ED Provider Notes (Signed)
[facility] EMERGENCY DEPARTMENT Provider Note   CSN: [id number] Arrival date & time: 04/04/22  2210     History  Chief Complaint  Patient presents with   ETOH Intoxication Lucas Williams    Lucas Williams is a 46 y.o. male.  Presents to the emergency department due to concern for episode of passing out.  Patient states that he is concerned it may have been a seizure however he says that no one told him he had any seizure activity and he did not have any bladder or bowel incontinence and he did not have any tongue biting.  He denies drinking alcohol before the episode occurred.  He states that he does drink alcohol heavily around once a week.  He denies going through withdrawal symptoms when he stops drinking for a prolonged period of time.  He currently has no symptoms and feels fine.  Does feel somewhat hungry.  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  Has been seen by neurology and takes Keppra 750 mg twice daily.  Denies any missed doses except for morning while in ER waiting room.  He is unsure if he fell.  He is not having any aches or pains at this time in his body.  Has been ambulatory.  HPI     Home Medications Prior to Admission medications   Medication Sig Start Date End Date Taking? Authorizing Provider  amLODipine (NORVASC) 10 MG tablet Take 1 tablet (10 mg total) by mouth daily. 03/17/22   Almon Hercules, MD  chlorhexidine (PERIDEX) 0.12 % solution Use as directed 15 mLs in the mouth or throat 2 (two) times daily. 03/17/22   Almon Hercules, MD  folic acid (FOLVITE) 1 MG tablet Take 1 tablet (1 mg total) by mouth daily. 03/17/22   Almon Hercules, MD  levETIRAcetam (KEPPRA) 750 MG tablet Take 1 tablet (750 mg total) by mouth 2 (two) times daily. 03/17/22   Almon Hercules, MD  Multiple Vitamin (MULTIVITAMIN ADULT) TABS Take 1 tablet by mouth daily.    [provider]  nicotine (NICODERM CQ - DOSED IN MG/24 HR)  7 mg/24hr patch Place 1 patch (7 mg total) onto the skin daily. Patient not taking: Reported on 01/18/2022 12/20/21   Rhetta Mura, MD  thiamine 100 MG tablet Take 1 tablet (100 mg total) by mouth daily. 03/17/22   Almon Hercules, MD      Allergies    Other    Review of Systems   Review of Systems  Constitutional:  Negative for chills and fever.  HENT:  Negative for ear pain and sore throat.   Eyes:  Negative for pain and visual disturbance.  Respiratory:  Negative for cough and shortness of breath.   Cardiovascular:  Negative for chest pain and palpitations.  Gastrointestinal:  Negative for abdominal pain and vomiting.  Genitourinary:  Negative for dysuria and hematuria.  Musculoskeletal:  Negative for arthralgias and back pain.  Skin:  Negative for color change and rash.  Neurological:  Positive for seizures and syncope.  All other systems reviewed and are negative.   Physical Exam Updated Vital Signs BP (!) 140/92   Pulse 87   Temp 98.1 F (36.7 C) (Oral)   Resp 18   SpO2 100%  Physical Exam Vitals and nursing note reviewed.  Constitutional:      General: He is not in acute distress.    Appearance: He is  well-developed.  HENT:     Head: Normocephalic and atraumatic.  Eyes:     Conjunctiva/sclera: Conjunctivae normal.  Cardiovascular:     Rate and Rhythm: Normal rate and regular rhythm.     Heart sounds: No murmur heard. Pulmonary:     Effort: Pulmonary effort is normal. No respiratory distress.     Breath sounds: Normal breath sounds.  Abdominal:     Palpations: Abdomen is soft.     Tenderness: There is no abdominal tenderness.  Musculoskeletal:        General: No swelling.     Cervical back: Neck supple.  Skin:    General: Skin is warm and dry.     Capillary Refill: Capillary refill takes less than 2 seconds.  Neurological:     Mental Status: He is alert.  Psychiatric:        Mood and Affect: Mood normal.     ED Results / Procedures / Treatments    Labs (all labs ordered are listed, but only abnormal results are displayed) Labs Reviewed  COMPREHENSIVE METABOLIC PANEL - Abnormal; Notable for the following components:      Result Value   Sodium 146 (*)    Glucose, Bld 130 (*)    Calcium 8.0 (*)    Total Protein 6.4 (*)    Albumin 3.3 (*)    AST 106 (*)    ALT 126 (*)    All other components within normal limits  CBC WITH DIFFERENTIAL/PLATELET - Abnormal; Notable for the following components:   RBC 4.10 (*)    Hemoglobin 12.8 (*)    HCT 38.8 (*)    All other components within normal limits  ETHANOL - Abnormal; Notable for the following components:   Alcohol, Ethyl (B) 447 (*)    All other components within normal limits  CBG MONITORING, ED - Abnormal; Notable for the following components:   Glucose-Capillary 122 (*)    All other components within normal limits    EKG None  Radiology CT Head Wo Contrast  Result Date: 04/05/2022 CLINICAL DATA:  Head trauma with neck pain EXAM: CT HEAD WITHOUT CONTRAST CT CERVICAL SPINE WITHOUT CONTRAST TECHNIQUE: Multidetector CT imaging of the head and cervical spine was performed following the standard protocol without intravenous contrast. Multiplanar CT image reconstructions of the cervical spine were also generated. RADIATION DOSE REDUCTION: This exam was performed according to the departmental dose-optimization program which includes automated exposure control, adjustment of the mA and/or kV according to patient size and/or use of iterative reconstruction technique. COMPARISON:  CT scan cervical spine 01/18/2022, most recent head CT 03/13/2022. FINDINGS: CT HEAD FINDINGS Brain: Broad-based right frontotemporal encephalomalacia is again noted and additional encephalomalacia inferior left frontal lobe. 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 No new asymmetry is seen concerning for an acute infarct, hemorrhage or mass. Cerebellum and brainstem are unremarkable. The  ventricles are normal in size and position. Vascular: No hyperdense vessel or unexpected calcification. Skull: No fracture or skull lesion is seen. Sinuses/Orbits: Unremarkable orbital contents. Mild membrane thickening noted both lower maxillary sinuses. Other sinuses and bilateral mastoid air cells are clear. Other: None. CT CERVICAL SPINE FINDINGS Alignment: Normal. Skull base and vertebrae: T1 distal spinous process fracture again noted with chronic appearance and nonunion. No acute cervical fracture is seen or focal bone lesion. The bone mineralization is normal. Soft tissues and spinal canal: No prevertebral fluid or swelling. No visible canal hematoma. There are mild calcifications in both proximal cervical ICAs.  Disc levels: The discs are degenerated to varying degrees, with greatest disc space loss C2-3 and C3-4, relatively mild disc space loss at the remaining levels. Anterior and posterior endplate osteophytes are present all levels except C7-T1, most prominently at C4-5, C5-6 and C6-7 but no spondylotic cord compression or herniated discs are seen. There is mild facet joint spurring at most levels as well but no significant foraminal compromise. Upper chest: Negative. Other: None. IMPRESSION: 1. No acute intracranial CT findings. Unchanged multifocal encephalomalacia. 2. Degenerative changes without evidence of cervical fractures or malalignment. 3. 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 Electronically Signed   By: Almira Bar M.D.   On: 04/05/2022 01:29   CT Cervical Spine Wo Contrast  Result Date: 04/05/2022 CLINICAL DATA:  Head trauma with neck pain EXAM: CT HEAD WITHOUT CONTRAST CT CERVICAL SPINE WITHOUT CONTRAST TECHNIQUE: Multidetector CT imaging of the head and cervical spine was performed following the standard protocol without intravenous contrast. Multiplanar CT image reconstructions of the cervical spine were also generated. RADIATION DOSE REDUCTION: This exam  was performed according to the departmental dose-optimization program which includes automated exposure control, adjustment of the mA and/or kV according to patient size and/or use of iterative reconstruction technique. COMPARISON:  CT scan cervical spine 01/18/2022, most recent head CT 03/13/2022. FINDINGS: CT HEAD FINDINGS Brain: Broad-based right frontotemporal encephalomalacia is again noted and additional encephalomalacia inferior left frontal lobe. 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 No new asymmetry is seen concerning for an acute infarct, hemorrhage or mass. Cerebellum and brainstem are unremarkable. The ventricles are normal in size and position. Vascular: No hyperdense vessel or unexpected calcification. Skull: No fracture or skull lesion is seen. Sinuses/Orbits: Unremarkable orbital contents. Mild membrane thickening noted both lower maxillary sinuses. Other sinuses and bilateral mastoid air cells are clear. Other: None. CT CERVICAL SPINE FINDINGS Alignment: Normal. Skull base and vertebrae: T1 distal spinous process fracture again noted with chronic appearance and nonunion. No acute cervical fracture is seen or focal bone lesion. The bone mineralization is normal. Soft tissues and spinal canal: No prevertebral fluid or swelling. No visible canal hematoma. There are mild calcifications in both proximal cervical ICAs. Disc levels: The discs are degenerated to varying degrees, with greatest disc space loss C2-3 and C3-4, relatively mild disc space loss at the remaining levels. Anterior and posterior endplate osteophytes are present all levels except C7-T1, most prominently at C4-5, C5-6 and C6-7 but no spondylotic cord compression or herniated discs are seen. There is mild facet joint spurring at most levels as well but no significant foraminal compromise. Upper chest: Negative. Other: None. IMPRESSION: 1. No acute intracranial CT findings. Unchanged multifocal  encephalomalacia. 2. Degenerative changes without evidence of cervical fractures or malalignment. 3. 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 Electronically Signed   By: Almira Bar M.D.   On: 04/05/2022 01:29    Procedures Procedures    Medications Ordered in ED Medications  levETIRAcetam (KEPPRA) tablet 750 mg (750 mg Oral Given 04/05/22 1304)    ED Course/ Medical Decision Making/ A&P                           Medical Decision Making  46 year old male presented to ER after episode of unresponsiveness.  Patient reports concern for possible seizure however unsure.  No bladder or bowel incontinence and no tongue biting.  On blood work that was obtained soon after patient arrived  in ER he was found to be profoundly intoxicated with alcohol.  I suspect the alcohol intoxication was the cause of him having an episode of syncope and I have lower suspicion for seizure.  CT head and C-spine were obtained, negative for acute trauma.  When I evaluated patient he clinically appears sober, conversant without any difficulty, denying any ongoing medical complaints.  His vital signs are normal.  He states he does take Keppra and was given his morning dose of Keppra in ER.  I instructed him to follow-up with his neurologist.  I discussed benefits of alcohol cessation with patient, spent about 5 minutes discussing this subject.  Given the work-up completed, no ongoing symptoms, feel he is stable for discharge at this time and does not require hospital admission.  Discharged.        Final Clinical Impression(s) / ED Diagnoses Final diagnoses:  Alcoholic intoxication without complication [facility])    Rx / DC Orders ED Discharge Orders     None         Milagros Loll, MD 04/05/22 1356

## 2022-04-05 NOTE — Discharge Instructions (Signed)
Please follow-up with your neurologist.  Because of the possible episode of passing out or seizure, I strongly recommend that you do not drive or operate heavy machinery or work on roofs or from height for the foreseeable future until you are cleared by your neurologist.  Come back to ER if you have any episodes of passing out or seizure activity.  Take your previously prescribed medicines.

## 2022-04-08 ENCOUNTER — Emergency Department (HOSPITAL_COMMUNITY)
Admission: EM | Admit: 2022-04-08 | Discharge: 2022-04-20 | Disposition: E | Payer: 59 | Attending: Emergency Medicine | Admitting: Emergency Medicine

## 2022-04-08 DIAGNOSIS — I469 Cardiac arrest, cause unspecified: Secondary | ICD-10-CM | POA: Insufficient documentation

## 2022-04-08 LAB — CBG MONITORING, ED: Glucose-Capillary: 170 mg/dL — ABNORMAL HIGH (ref 70–99)

## 2022-04-08 MED ORDER — NALOXONE HCL 2 MG/2ML IJ SOSY
PREFILLED_SYRINGE | INTRAMUSCULAR | Status: AC | PRN
  Administered 2022-04-08: 2 mg via INTRAVENOUS

## 2022-04-08 MED ORDER — EPINEPHRINE 1 MG/10ML IJ SOSY
PREFILLED_SYRINGE | INTRAMUSCULAR | Status: AC | PRN
  Administered 2022-04-08 (×2): 1 mg via INTRAVENOUS

## 2022-04-20 NOTE — ED Provider Notes (Signed)
[facility] EMERGENCY DEPARTMENT Provider Note   CSN: [id number] Arrival date & time: 2022-04-09  1441     History  Chief Complaint  Patient presents with   Cardiac Arrest    Lucas Williams is a 46 y.o. male presenting to the emergency department by EMS in cardiac arrest.  History is provided by the paramedics.  They report that they were called to scene by fire rescue as the patient had been found down in a public setting, near a dumpster, with reported concern for possible drug overdose.  Fire rescue had reported to paramedics that the patient was known to them, had overdosed several times on opiates requiring Narcan resuscitation.  EMS found patient in PEA arrest.  It is not clear how long his downtime had been.  EMS placed a [facility] airway, a right tibial IO, and gave the patient Narcan, fluids.  His blood sugar was normal in the field.  Patient underwent attempted resuscitation with CPR and up to 8 rounds of epinephrine per oral report from EMS.  He remained pulseless and asystole and PEA arrest intermittently.  On arrival the patient is obtunded, unresponsive.  HPI     Home Medications Prior to Admission medications   Medication Sig Start Date End Date Taking? Authorizing Provider  amLODipine (NORVASC) 10 MG tablet Take 1 tablet (10 mg total) by mouth daily. 03/17/22   Almon Hercules, MD  chlorhexidine (PERIDEX) 0.12 % solution Use as directed 15 mLs in the mouth or throat 2 (two) times daily. 03/17/22   Almon Hercules, MD  folic acid (FOLVITE) 1 MG tablet Take 1 tablet (1 mg total) by mouth daily. 03/17/22   Almon Hercules, MD  levETIRAcetam (KEPPRA) 750 MG tablet Take 1 tablet (750 mg total) by mouth 2 (two) times daily. 03/17/22   Almon Hercules, MD  Multiple Vitamin (MULTIVITAMIN ADULT) TABS Take 1 tablet by mouth daily.    [provider]  nicotine (NICODERM CQ - DOSED IN MG/24 HR) 7 mg/24hr patch Place 1 patch (7 mg total) onto the skin daily. Patient not  taking: Reported on 01/18/2022 12/20/21   Rhetta Mura, MD  thiamine 100 MG tablet Take 1 tablet (100 mg total) by mouth daily. 03/17/22   Almon Hercules, MD      Allergies    Other    Review of Systems   Review of Systems  Physical Exam Updated Vital Signs Ht 6' (1.829 m)   Wt 90.7 kg   BMI 27.12 kg/m  Physical Exam Constitutional:      Comments: Obtunded  Eyes:     Comments: Pupils are fixed and dilated bilaterally  Cardiovascular:     Comments: No pulses Pulmonary:     Comments: Bilateral breath sounds for bag ventilation Neurological:     Comments: GCS of 3.  No gag reflex.  No corneal blink reflex     ED Results / Procedures / Treatments   Labs (all labs ordered are listed, but only abnormal results are displayed) Labs Reviewed  CBG MONITORING, ED - Abnormal; Notable for the following components:      Result Value   Glucose-Capillary 170 (*)    All other components within normal limits    EKG None  Radiology No results found.  Procedures Procedures    Medications Ordered in ED Medications  EPINEPHrine (ADRENALIN) 1 MG/10ML injection (1 mg Intravenous Given Apr 09, 2022 1446)  naloxone (NARCAN) injection (2 mg Intravenous Given 2022-04-09 1445)  ED Course/ Medical Decision Making/ A&P Clinical Course as of May 03, 2022 1601  Thu May 03, 2022  1453 Time of death 2:49 pm [MT]  1510 No response from either family contact (son and daughter) listed in Epic. [MT]  1515 I spoke to the ME Dr Hyacinth Meeker who will come evaluate patient. [MT]    Clinical Course User Index [MT] Akira Perusse, Kermit Balo, MD                           Medical Decision Making Risk Prescription drug management.   Patient arrived by ambulance in cardiac arrest.  Unknown downtime.  Approximately 30 to 40 minutes of attempted resuscitation in field, and he remained in PEA arrest.  On arrival he has no neurological reflexes, GCS of 3, concerning for anoxic brain injury.  His Accu-Chek here was  within normal limits.  Additional Narcan was given here with no response.  Epinephrine was also given for 1 round.  On repeat pulse checks he remained in asystole, flatline rhythm.  Bedside echocardiogram showed his heart a cardiac standstill, no large pericardial effusion.  At this point further resuscitation was felt to be futile, and the patient was pronounced deceased 2:49 PM.  Medical examiner Dr Al Decant was contacted and will come evaluate the patient as a possible case for ME, suspected drug overdose with opiates.  The patient is not a candidate for organ donation given his suspected drug overdose and prolonged downtime.  Attempts were made to contact the patient's family, son and daughter listed as emergency contacts, no immediate response from either family member.  Staff will continue to try to reach out to family.        Final Clinical Impression(s) / ED Diagnoses Final diagnoses:  Cardiac arrest [facility])    Rx / DC Orders ED Discharge Orders     None         Zeph Riebel, Kermit Balo, MD 05/03/2022 1601

## 2022-04-20 NOTE — ED Provider Notes (Addendum)
Procedure Name: Intubation Date/Time: 05-01-22 2:54 PM  Performed by: Gailen Shelter, PAPre-anesthesia Checklist: Patient identified, Patient being monitored, Emergency Drugs available, Timeout performed and Suction available Oxygen Delivery Method: Non-rebreather mask Preoxygenation: Pre-oxygenation with 100% oxygen Induction Type: Rapid sequence Ventilation: Mask ventilation without difficulty Laryngoscope Size: Glidescope and 4 Endobronchial tube: 37 Fr Tube size: 7.5 mm Number of attempts: 1 Placement Confirmation: ETT inserted through vocal cords under direct vision, CO2 detector and Breath sounds checked- equal and bilateral Secured at: 25 (at the teeth) cm Tube secured with: ETT holder Comments: Anterior airway - cricoid pressure administered by Dr. Renaye Rakers - attending physician. Successful intubation w BL breath sounds and colorimeter change.         Gailen Shelter, [place] 2022/05/01 1459    Gailen Shelter, [place] 05-01-22 1501    Terald Sleeper, MD 01-May-2022 [phone]

## 2022-04-20 NOTE — ED Triage Notes (Signed)
[place], [place] replacing King airway with ETT at this time.

## 2022-04-20 NOTE — ED Notes (Signed)
[address] Notified Ref # [id number].   EDP to notify family listed on file in pt's EHR.

## 2022-04-20 NOTE — Code Documentation (Addendum)
Pt BIB [facility] was found in cardiac arrest behind a dumpster. Pt reportedly known as chronic substance abuser. EMS admin 4mg  of Narcan and 6-8 rounds of 1mg  Epi. EMS report they have been performing CPR for PTA. They noted PEA during their care with no return of pulses. Lucas device in place with proper placement verified.

## 2022-04-20 DEATH — deceased

## 2022-07-14 LAB — HSV DNA BY PCR (REFERENCE LAB)
HSV 1 DNA: NEGATIVE
HSV 2 DNA: NEGATIVE

## 2022-08-12 IMAGING — CT CT L SPINE W/O CM
3 of 4 series · 10 of 33 positions shown, 11 images · non-contrast
Comparison: None Available.

CLINICAL DATA: Low back pain, trauma.



[Series 4: l spine st · axial · 0.36mm/px · z∈[-112,-6]mm · 2 of 160 slices shown, 3 images]
[im 54/160  soft-tissue]
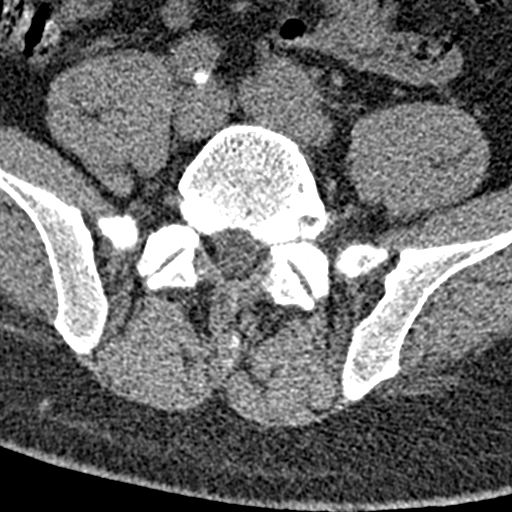
[im 54/160  bone]
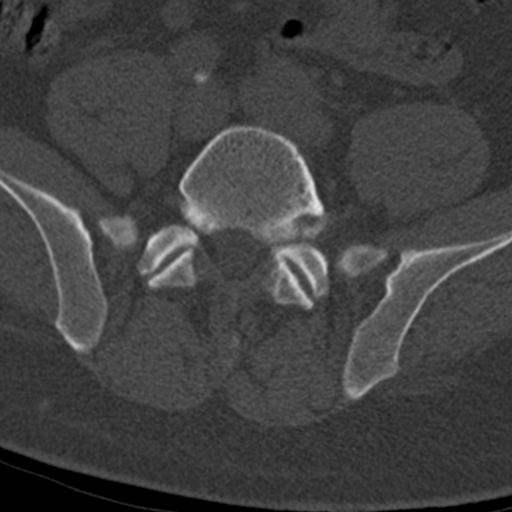
[im 107/160  bone]
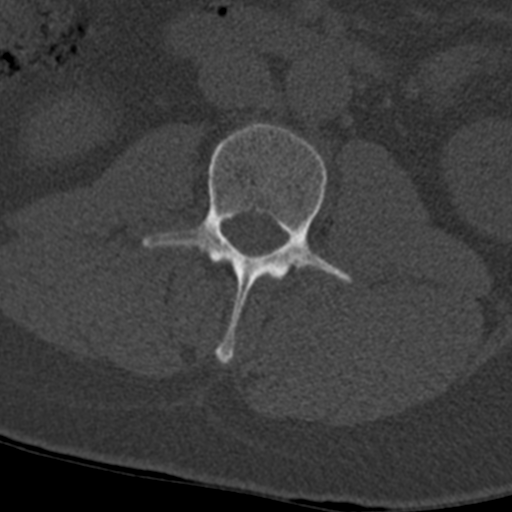

[Series 8: coronal bone · coronal · 0.29mm/px · 3 of 77 slices shown]
[im 16/77  bone]
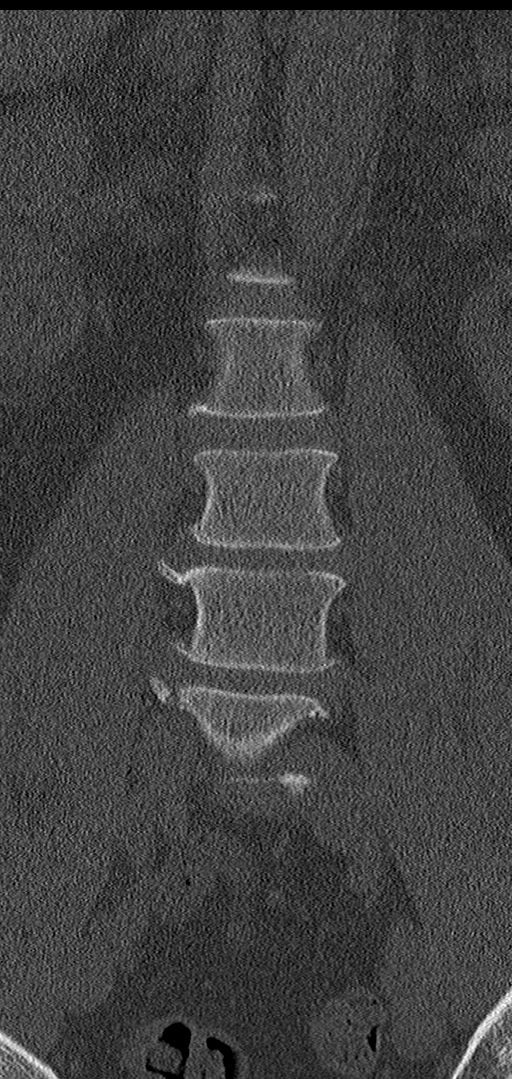
[im 31/77  bone]
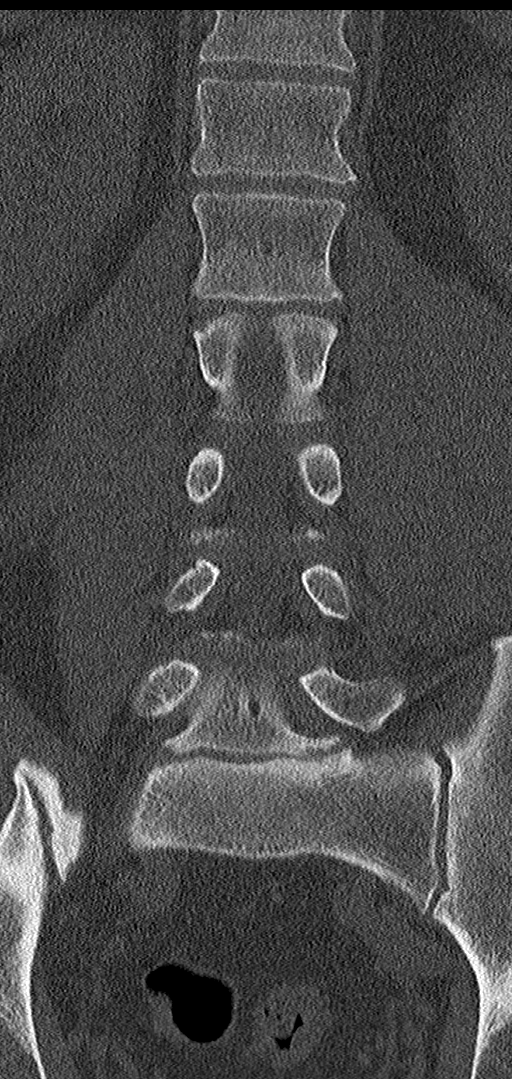
[im 46/77  bone]
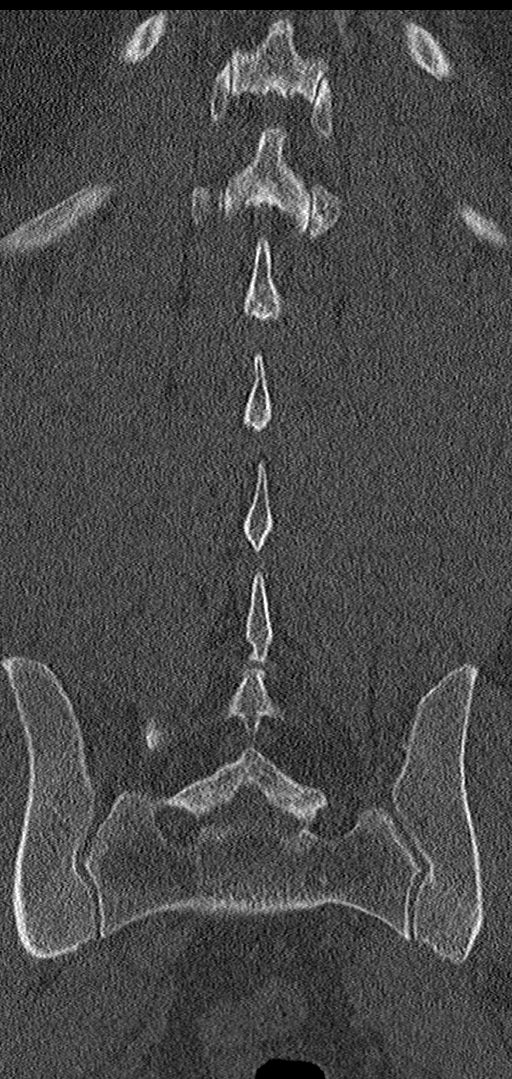

[Series 10: sagittal st · sagittal · 0.30mm/px · 5 of 75 slices shown]
[im 25/75  bone]
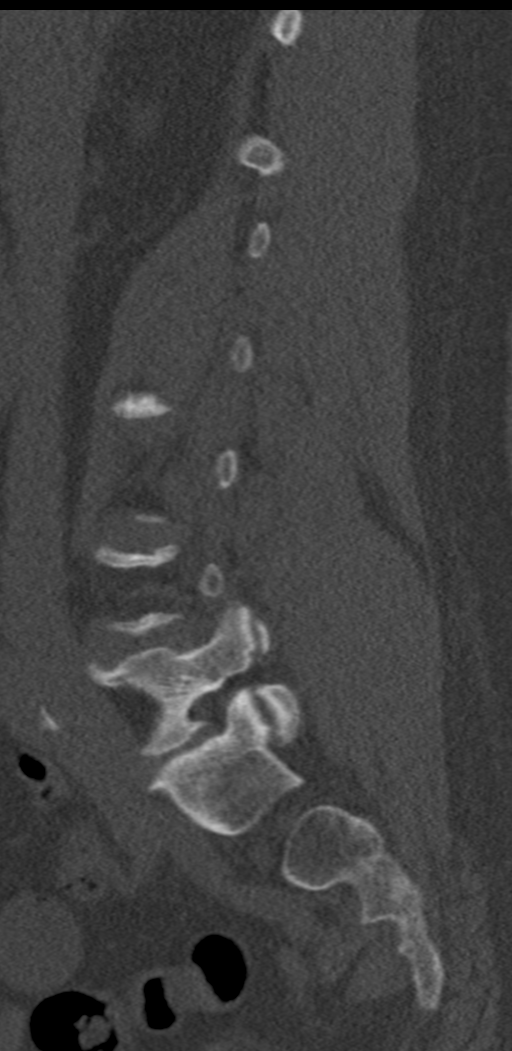
[im 31/75  bone]
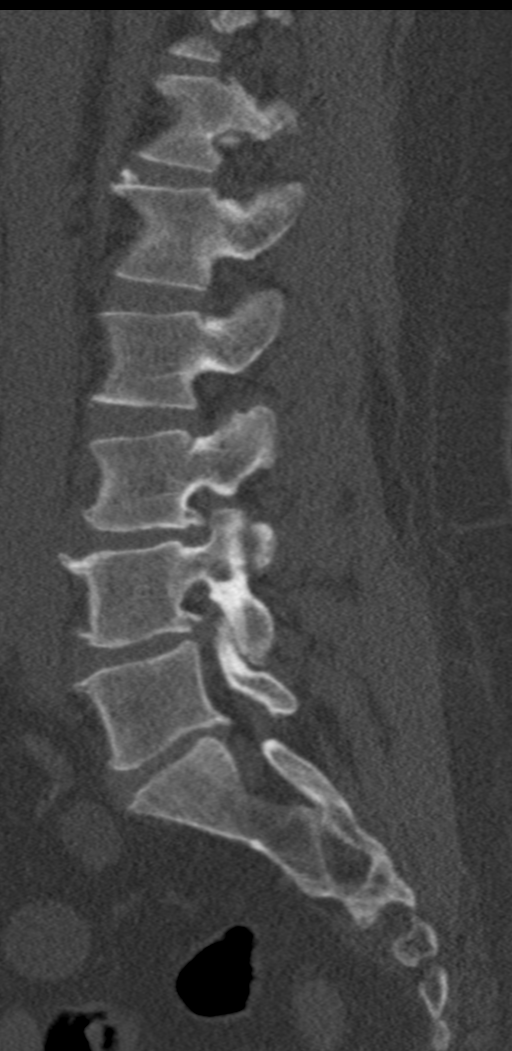
[im 38/75  bone]
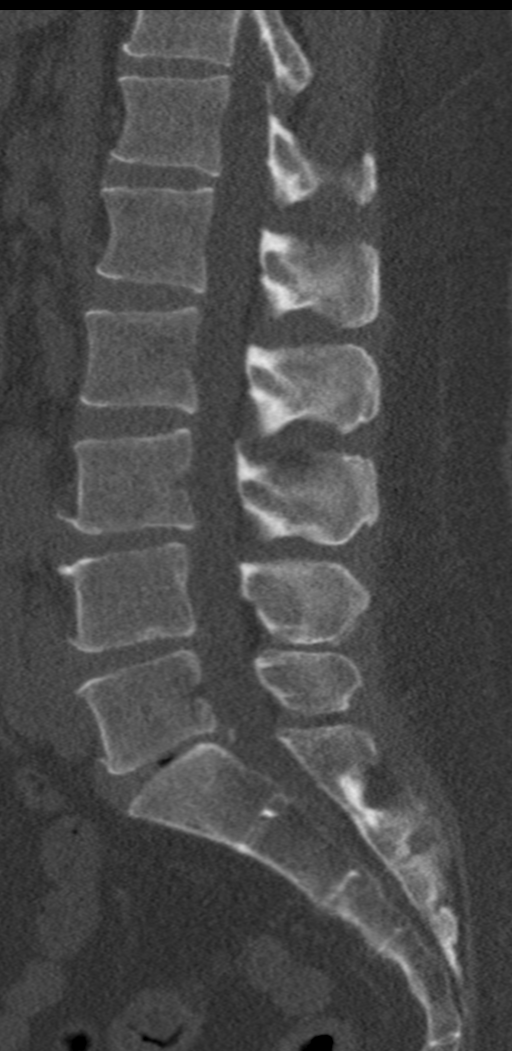
[im 44/75  bone]
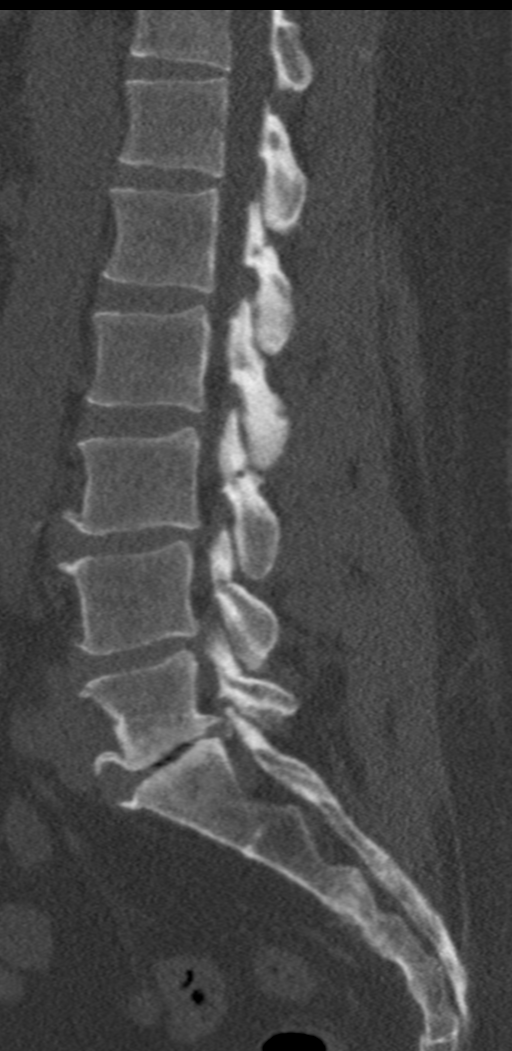
[im 50/75  bone]
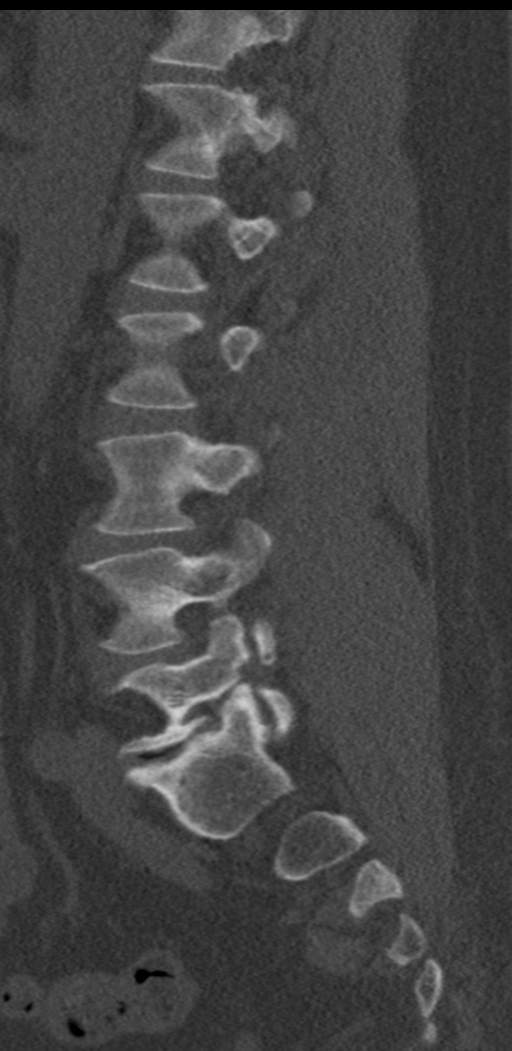

[10 of 33 positions shown; findings below may reference images not displayed]

FINDINGS: Segmentation: 5 lumbar type vertebrae

Alignment: Normal.

Vertebrae: No acute fracture or focal pathologic process.

Paraspinal and other soft tissues: Negative for perispinal mass or
inflammation

Disc levels: Disc space narrowing and ridging especially at L3-4 and
below. Right preferential disc bulging and endplate spurring causes
moderate right foraminal narrowing at L3-4 and L4-5. Moderate left
foraminal narrowing at L5-S1 due to disc height loss and endplate
spurring.
IMPRESSION: 1. No acute finding.
2. Lumbar spine degeneration at L3-4 and below with moderate
foraminal narrowings on the right at L3-4, L4-5 and on the left at
L5-S1.
